# Patient Record
Sex: Male | Born: 1945
Health system: Southern US, Community
[De-identification: ages and names within clinical notes are randomized; demographics above are authoritative.]

## PROBLEM LIST (undated history)

## (undated) DIAGNOSIS — L509 Urticaria, unspecified: Secondary | ICD-10-CM

## (undated) DIAGNOSIS — I2510 Atherosclerotic heart disease of native coronary artery without angina pectoris: Secondary | ICD-10-CM

## (undated) DIAGNOSIS — L409 Psoriasis, unspecified: Secondary | ICD-10-CM

## (undated) DIAGNOSIS — E7800 Pure hypercholesterolemia, unspecified: Secondary | ICD-10-CM

## (undated) DIAGNOSIS — Z87442 Personal history of urinary calculi: Secondary | ICD-10-CM

## (undated) DIAGNOSIS — M549 Dorsalgia, unspecified: Secondary | ICD-10-CM

## (undated) DIAGNOSIS — L309 Dermatitis, unspecified: Secondary | ICD-10-CM

## (undated) DIAGNOSIS — K5790 Diverticulosis of intestine, part unspecified, without perforation or abscess without bleeding: Secondary | ICD-10-CM

## (undated) DIAGNOSIS — M1990 Unspecified osteoarthritis, unspecified site: Secondary | ICD-10-CM

## (undated) DIAGNOSIS — I739 Peripheral vascular disease, unspecified: Secondary | ICD-10-CM

## (undated) DIAGNOSIS — G629 Polyneuropathy, unspecified: Secondary | ICD-10-CM

## (undated) DIAGNOSIS — E785 Hyperlipidemia, unspecified: Secondary | ICD-10-CM

## (undated) DIAGNOSIS — K219 Gastro-esophageal reflux disease without esophagitis: Secondary | ICD-10-CM

## (undated) DIAGNOSIS — I10 Essential (primary) hypertension: Secondary | ICD-10-CM

## (undated) DIAGNOSIS — E669 Obesity, unspecified: Secondary | ICD-10-CM

## (undated) DIAGNOSIS — I779 Disorder of arteries and arterioles, unspecified: Secondary | ICD-10-CM

## (undated) HISTORY — DX: Diverticulosis of intestine, part unspecified, without perforation or abscess without bleeding: K57.90

## (undated) HISTORY — PX: BACK SURGERY: SHX140

## (undated) HISTORY — DX: Dorsalgia, unspecified: M54.9

## (undated) HISTORY — DX: Disorder of arteries and arterioles, unspecified: I77.9

## (undated) HISTORY — DX: Dermatitis, unspecified: L30.9

## (undated) HISTORY — PX: CORONARY STENT PLACEMENT: SHX1402

## (undated) HISTORY — DX: Gastro-esophageal reflux disease without esophagitis: K21.9

## (undated) HISTORY — DX: Obesity, unspecified: E66.9

## (undated) HISTORY — PX: CHOLECYSTECTOMY: SHX55

## (undated) HISTORY — PX: HERNIA REPAIR: SHX51

## (undated) HISTORY — DX: Peripheral vascular disease, unspecified: I73.9

## (undated) HISTORY — PX: KIDNEY STONE SURGERY: SHX686

## (undated) HISTORY — PX: TONSILLECTOMY: SUR1361

## (undated) HISTORY — DX: Polyneuropathy, unspecified: G62.9

## (undated) HISTORY — PX: NO PAST SURGERIES: SHX2092

## (undated) HISTORY — PX: APPENDECTOMY: SHX54

## (undated) HISTORY — DX: Psoriasis, unspecified: L40.9

## (undated) HISTORY — DX: Urticaria, unspecified: L50.9

## (undated) HISTORY — DX: Hyperlipidemia, unspecified: E78.5

---

## 1998-11-14 ENCOUNTER — Inpatient Hospital Stay (HOSPITAL_COMMUNITY): Admission: RE | Admit: 1998-11-14 | Discharge: 1998-11-16 | Payer: Self-pay | Admitting: Neurosurgery

## 1998-12-19 ENCOUNTER — Ambulatory Visit (HOSPITAL_COMMUNITY): Admission: RE | Admit: 1998-12-19 | Discharge: 1998-12-19 | Payer: Self-pay | Admitting: Neurosurgery

## 1998-12-19 ENCOUNTER — Encounter: Payer: Self-pay | Admitting: Neurosurgery

## 1999-02-23 ENCOUNTER — Ambulatory Visit (HOSPITAL_COMMUNITY): Admission: RE | Admit: 1999-02-23 | Discharge: 1999-02-23 | Payer: Self-pay | Admitting: Neurosurgery

## 1999-02-23 ENCOUNTER — Encounter: Payer: Self-pay | Admitting: Neurosurgery

## 2002-03-22 ENCOUNTER — Encounter: Admission: RE | Admit: 2002-03-22 | Discharge: 2002-03-22 | Payer: Self-pay | Admitting: Neurosurgery

## 2002-03-22 ENCOUNTER — Encounter: Payer: Self-pay | Admitting: Neurosurgery

## 2002-12-13 ENCOUNTER — Ambulatory Visit (HOSPITAL_COMMUNITY): Admission: RE | Admit: 2002-12-13 | Discharge: 2002-12-14 | Payer: Self-pay | Admitting: Cardiology

## 2002-12-13 ENCOUNTER — Encounter: Payer: Self-pay | Admitting: Cardiology

## 2003-01-14 ENCOUNTER — Encounter (HOSPITAL_COMMUNITY): Admission: RE | Admit: 2003-01-14 | Discharge: 2003-04-14 | Payer: Self-pay | Admitting: Cardiology

## 2003-04-15 ENCOUNTER — Encounter (HOSPITAL_COMMUNITY): Admission: RE | Admit: 2003-04-15 | Discharge: 2003-04-24 | Payer: Self-pay | Admitting: *Deleted

## 2004-11-16 ENCOUNTER — Encounter: Admission: RE | Admit: 2004-11-16 | Discharge: 2004-12-01 | Payer: Self-pay | Admitting: Family Medicine

## 2005-09-22 ENCOUNTER — Encounter: Payer: Self-pay | Admitting: Neurosurgery

## 2007-05-08 ENCOUNTER — Encounter: Admission: RE | Admit: 2007-05-08 | Discharge: 2007-05-08 | Payer: Self-pay | Admitting: Occupational Medicine

## 2007-10-23 HISTORY — PX: CARDIAC CATHETERIZATION: SHX172

## 2007-10-25 ENCOUNTER — Inpatient Hospital Stay (HOSPITAL_BASED_OUTPATIENT_CLINIC_OR_DEPARTMENT_OTHER): Admission: RE | Admit: 2007-10-25 | Discharge: 2007-10-25 | Payer: Self-pay | Admitting: Cardiology

## 2009-12-26 ENCOUNTER — Emergency Department (HOSPITAL_BASED_OUTPATIENT_CLINIC_OR_DEPARTMENT_OTHER): Admission: EM | Admit: 2009-12-26 | Discharge: 2009-12-27 | Payer: Self-pay | Admitting: Emergency Medicine

## 2009-12-30 ENCOUNTER — Encounter: Admission: RE | Admit: 2009-12-30 | Discharge: 2009-12-30 | Payer: Self-pay | Admitting: Family Medicine

## 2010-01-13 ENCOUNTER — Encounter (INDEPENDENT_AMBULATORY_CARE_PROVIDER_SITE_OTHER): Payer: Self-pay | Admitting: General Surgery

## 2010-01-13 ENCOUNTER — Ambulatory Visit (HOSPITAL_COMMUNITY): Admission: RE | Admit: 2010-01-13 | Discharge: 2010-01-14 | Payer: Self-pay | Admitting: General Surgery

## 2010-08-07 LAB — DIFFERENTIAL
Basophils Absolute: 0 10*3/uL (ref 0.0–0.1)
Basophils Absolute: 0 10*3/uL (ref 0.0–0.1)
Basophils Relative: 1 % (ref 0–1)
Basophils Relative: 1 % (ref 0–1)
Eosinophils Absolute: 0.2 10*3/uL (ref 0.0–0.7)
Eosinophils Absolute: 0.3 10*3/uL (ref 0.0–0.7)
Eosinophils Relative: 3 % (ref 0–5)
Eosinophils Relative: 4 % (ref 0–5)
Lymphocytes Relative: 18 % (ref 12–46)
Lymphocytes Relative: 22 % (ref 12–46)
Lymphs Abs: 1.2 10*3/uL (ref 0.7–4.0)
Lymphs Abs: 1.6 10*3/uL (ref 0.7–4.0)
Monocytes Absolute: 0.4 10*3/uL (ref 0.1–1.0)
Monocytes Absolute: 0.7 10*3/uL (ref 0.1–1.0)
Monocytes Relative: 6 % (ref 3–12)
Monocytes Relative: 9 % (ref 3–12)
Neutro Abs: 4.9 10*3/uL (ref 1.7–7.7)
Neutro Abs: 4.7 10*3/uL (ref 1.7–7.7)
Neutrophils Relative %: 72 % (ref 43–77)
Neutrophils Relative %: 65 % (ref 43–77)

## 2010-08-07 LAB — CBC
HCT: 35 % — ABNORMAL LOW (ref 39.0–52.0)
HCT: 38.7 % — ABNORMAL LOW (ref 39.0–52.0)
Hemoglobin: 12.4 g/dL — ABNORMAL LOW (ref 13.0–17.0)
Hemoglobin: 13.5 g/dL (ref 13.0–17.0)
MCH: 30.2 pg (ref 26.0–34.0)
MCH: 30.3 pg (ref 26.0–34.0)
MCHC: 35.4 g/dL (ref 30.0–36.0)
MCHC: 34.9 g/dL (ref 30.0–36.0)
MCV: 85.3 fL (ref 78.0–100.0)
MCV: 86.9 fL (ref 78.0–100.0)
Platelets: 182 10*3/uL (ref 150–400)
Platelets: 150 10*3/uL (ref 150–400)
RBC: 4.11 MIL/uL — ABNORMAL LOW (ref 4.22–5.81)
RBC: 4.46 MIL/uL (ref 4.22–5.81)
RDW: 13.5 % (ref 11.5–15.5)
RDW: 12.2 % (ref 11.5–15.5)
WBC: 6.8 10*3/uL (ref 4.0–10.5)
WBC: 7.3 10*3/uL (ref 4.0–10.5)

## 2010-08-07 LAB — COMPREHENSIVE METABOLIC PANEL
ALT: 29 U/L (ref 0–53)
ALT: 39 U/L (ref 0–53)
AST: 32 U/L (ref 0–37)
AST: 38 U/L — ABNORMAL HIGH (ref 0–37)
Albumin: 4.1 g/dL (ref 3.5–5.2)
Albumin: 4.2 g/dL (ref 3.5–5.2)
Alkaline Phosphatase: 62 U/L (ref 39–117)
Alkaline Phosphatase: 80 U/L (ref 39–117)
BUN: 8 mg/dL (ref 6–23)
BUN: 13 mg/dL (ref 6–23)
CO2: 29 mEq/L (ref 19–32)
CO2: 27 mEq/L (ref 19–32)
Calcium: 9.5 mg/dL (ref 8.4–10.5)
Calcium: 9.2 mg/dL (ref 8.4–10.5)
Chloride: 104 mEq/L (ref 96–112)
Chloride: 104 mEq/L (ref 96–112)
Creatinine, Ser: 0.89 mg/dL (ref 0.4–1.5)
Creatinine, Ser: 0.9 mg/dL (ref 0.4–1.5)
GFR calc Af Amer: >60 mL/min (ref >60)
GFR calc Af Amer: >60 mL/min (ref >60)
GFR calc non Af Amer: >60 mL/min (ref >60)
GFR calc non Af Amer: >60 mL/min (ref >60)
Glucose, Bld: 121 mg/dL — ABNORMAL HIGH (ref 70–99)
Glucose, Bld: 121 mg/dL — ABNORMAL HIGH (ref 70–99)
Potassium: 4.2 mEq/L (ref 3.5–5.1)
Potassium: 4.1 mEq/L (ref 3.5–5.1)
Sodium: 141 mEq/L (ref 135–145)
Sodium: 141 mEq/L (ref 135–145)
Total Bilirubin: 0.9 mg/dL (ref 0.3–1.2)
Total Bilirubin: 1.3 mg/dL — ABNORMAL HIGH (ref 0.3–1.2)
Total Protein: 6.7 g/dL (ref 6.0–8.3)
Total Protein: 7.2 g/dL (ref 6.0–8.3)

## 2010-08-07 LAB — SURGICAL PCR SCREEN
MRSA, PCR: NEGATIVE
Staphylococcus aureus: POSITIVE — AB

## 2010-08-07 LAB — LIPASE, BLOOD: Lipase: 434 U/L — ABNORMAL HIGH (ref 23–300)

## 2010-08-07 LAB — AMYLASE: Amylase: 65 U/L (ref 0–105)

## 2010-10-06 NOTE — Cardiovascular Report (Signed)
NAMEBETHANY, CUMMING NO.:  [id number]   MEDICAL RECORD NO.:  [id number]          PATIENT TYPE:  OIB   LOCATION:  1961                         FACILITY:  [facility]   PHYSICIAN:  Armanda Magic, M.D.     DATE OF BIRTH:  12-Jan-1946   DATE OF PROCEDURE:  10/25/2007  DATE OF DISCHARGE:  10/25/2007                            CARDIAC CATHETERIZATION   REFERRING PHYSICIAN:  Chales Salmon. Abigail Miyamoto, MD   PROCEDURE:  1. Left heart catheterization.  2. Coronary angiography.  3. Left ventriculography.   OPERATOR:  Armanda Magic, MD   INDICATIONS:  Chest pain and abnormal Cardiolite with known coronary  disease.   COMPLICATIONS:  None.   IV MEDICATIONS:  Versed 1 mg, fentanyl 25 mcg.   This is a 65 year old male who has a history of known coronary disease  status post PTCA stenting of the left circumflex in 2004.  He  also has  a history of hypertension and has also been having some exertional  shortness of breath.  He now presents for cardiac catheterization  because of an abnormal Cardiolite with possible ischemia in the inferior  wall.   The patient was brought to the outpatient cath lab in the fasting  nonsedated state.  Informed consent was obtained.  The patient was  connected to continuous heart rate and pulse oximetry monitoring and  intermittent blood pressure monitoring.  The right groin was prepped and  draped in a sterile fashion.  Xylocaine 1% was used for local  anesthesia.  Using the modified Seldinger technique, a 4-French sheath  was placed in the right femoral artery.  Under fluoroscopic guidance, a  4-French JL-4 catheter was placed in the left coronary artery.  Multiple  cine films were taken at 30-degree RAO and 40-degree LAO views.  This  catheter was then exchanged out over a guidewire for a 4-French 3DRC  catheter which was placed under fluoroscopic guidance in the right  coronary artery.  Multiple cine films were taken at 30-degree RAO and 40-  degree  LAO views.  This catheter was then exchanged over a guidewire for  4-French angled pigtail catheter which was placed under fluoroscopic  guidance in the left ventricular cavity.  Left ventriculography was  performed in the 30-degree RAO view using total of 30 mL of contrast at  15 mL per second.  Catheter was then pulled back across the aortic valve  with no significant gradient noted.  At the end of the procedure, all  catheters and sheaths were removed.  Manual compression was performed  and adequate hemostasis was obtained.  The patient was transferred back  to recovery room in stable condition.   RESULTS:  1. The left main coronary artery is widely patent and bifurcates in      left anterior descending artery and left circumflex artery.  The      left anterior descending artery is widely patent throughout its      course.  The apex giving rise to a first diagonal which is widely      patent and then a second diagonal which bifurcates in 2 daughter  vessels that is widely patent.   1. The left circumflex is widely patent in its proximal portion giving      rise to a first obtuse marginal branch.  The left circumflex then      has a 20% narrowing just proximal to the stent that is in the mid      left circumflex.  The stent is widely patent.  Just distal to the      distal edge of the stent, there is a 50% narrowing in the left      circumflex.  It then gives rise to an obtuse marginal 2 and 3      branches, both of which are widely patent.   1. The right coronary artery is widely patent throughout its course      with luminal irregularities up to 20% in the mid and distal      portions.  It then bifurcates into posterior descending artery and      posterior lateral artery both of which are widely patent.   1. Left ventriculography shows normal LV function, EF 55%, LV pressure      105/6 mmHg, aortic pressure 97/59 mmHg, LVEDP 13 mmHg.   ASSESSMENT:  1. Nonobstructive  coronary disease with 50% stenosis of the left      circumflex just distal to the stent, otherwise minimal      nonobstructive disease of the right coronary artery.  2. Normal left ventricular function.  3. Normal left ventricular end diastolic pressure.  4. Noncardiac shortness of breath, most likely secondary to obesity      plus positive stress Cardiolite study most likely secondary to      diaphragmatic attenuation.   PLAN:  Continue medical management on discharge to home after IV fluid  and bedrest.  He will follow up with my nurse practitioner in 2 weeks  for groin check.      Armanda Magic, M.D.  Electronically Signed     TT/MEDQ  D:  10/25/2007  T:  10/25/2007  Job:  [id number]   cc:   Chales Salmon. Abigail Miyamoto, M.D.

## 2010-10-09 NOTE — H&P (Signed)
NAME:  QAIS, JOWERS                      ACCOUNT NO.:  [id number]   MEDICAL RECORD NO.:  [id number]                   PATIENT TYPE:  OIB   LOCATION:  2872                                 FACILITY:  [facility]   PHYSICIAN:  Francisca December, M.D.               DATE OF BIRTH:  [birth year]   DATE OF ADMISSION:  12/13/2002  DATE OF DISCHARGE:                                HISTORY & PHYSICAL   ADMISSION DIAGNOSES:  1. Coronary artery disease - mid circumflex/proximal obtuse marginal two     lesions.  2. Normal ejection fraction with normal LV function.  3. History of hyperlipidemia - currently not on statin therapy.  4. History of significant transaminase elevation on Lipitor therapy in the     past.  5. Hypertension.  6. Obesity.  7. Sedentary lifestyle.  8. Tobacco use.  9. Strong family history of coronary artery disease.   HISTORY OF PRESENT ILLNESS:  Mr. Olivo is a very pleasant 65 year old  gentleman who has undergone cardiac catheterization at the [facility] due to recurrent episodes of chest tightness and exertional dyspnea.  This was performed on November 28, 2002 by Dr. Amil Amen and revealed the  following:  LV was of normal chamber size and normal systolic function  without wall motion abnormality. EF 57%. Trileaflet aortic valve opens  normally. Mild MR, but may have been catheter induced. There was also left  coronary calcification seen. The RCA was dominant and the left main was  normal. The LAD and its branches were widely patent. Minimal luminal  irregularities. The circumflex and its branches were moderately diseased,  but without significant obstruction down to the mid portion. There was a  small OM1. The second OM has a 50-60% stenosis in its proximal portion. The  ongoing circ and AV groove are small and without significant obstruction.  The RCA and its branches are mildly diseased. Collateral vessels were not  seen. Based on that study the  possibility of percutaneous intervention  versus medical therapy were discussed with the patient and ultimately a  decision was made to proceed with PCI.   ALLERGIES:  No known drug allergies.   MEDICATIONS:  1. Folic acid.  2. Enteric-coated aspirin 325 mg a day.  3. Doxazosin 8 mg one-half a day.  4. Nexium 40 mg.  5. Hydrochlorothiazide 25 mg.  6. Bextra 20 mg per day - on hold today.   PAST MEDICAL HISTORY:  1. Hypertension.  2. History of hyperlipidemia - treated with Lipitor in the past, but has     apparently significant transaminase elevation and this was discontinued.     Will need to obtain these results.  3. Obesity.  4. Tobacco use.   The patient denies diabetes, thyroid disorder, seizure, or stroke. He has  had lower back surgery and some radiculopathy to the right lower extremity  as a result.   FAMILY HISTORY:  Both  parents are deceased. His father died in his early 34s  of an MI. His mother had a pacemaker. He has a brother who developed heart  disease in his late 81s.   SOCIAL HISTORY:  The patient is married and he has two children and one  grandchild on the way. He is employed as a Training and development officer and  has worked for [employer] for 24 years. For 23 years he worked first shift.  Last year they put him on third shift and he has had difficulties adjusting  - not sleeping well - drowsiness between 2:30 and 3:30 in the morning, etc.  He does not get any regular exercise. He chews a pack to a pack and a half  of tobacco a day and used to smoke cigarettes having quit over 18 years ago,  but at that time smoked a pack and a half to two packs for somewhere in the  neighborhood of 10+ years. He drinks at least a gallon of sweet tea a day,  usually caffeinated, but does not use illicit drugs.   REVIEW OF SYSTEMS:  The patient denies fevers, chills, cough, or cold  symptoms. No congestion. No dizziness, change in vision, or headaches. No  stroke type  symptoms. No reflux or heartburn except after eating onions. He  denies melena, hematuria, dysuria, and denies any current prostrate  troubles. No lower extremity edema. He occasionally has right lower  extremity pain/paresthesias due to back surgery.   PHYSICAL EXAMINATION:  VITAL SIGNS:  Blood pressure 148/83, pulse 64,  respirations 16. SAO2 97% on room air.  GENERAL:  The patient is alert and oriented x3 in no acute distress lying on  the exam table. He is attended by his wife and his son who is a IT sales professional.  HEENT:  Normocephalic, atraumatic.  NECK:  Supple without bruits or masses. No JVD.  LUNGS:  Clear to auscultation without crackles or wheezes.  CARDIAC:  Regular rate and rhythm without murmur, gallop, or rub.  ABDOMEN:  Obese, soft, nontender, and nondistended. Normal active bowel  sounds x4 quadrants.  EXTREMITIES:  2+ femoral pulses bilaterally without bruits. 2+ distal pulses  bilaterally without edema.  NEUROLOGIC:  Nonfocal. Mentation intact.  SKIN:  Exposed skin without obvious lesions.   LABORATORY DATA:  Review of laboratory studies done on December 06, 2002 shows a  BUN of 10, creatinine 0.9, and calcium of 3.6. Hemoglobin 13.3 and platelets  195.   PLAN:  The patient has been seen by the [facility] research team and has agreed  to enroll in the steeple study.   The risks of cardiac catheterization and intervention have been reviewed  with the patient and his family and they agree to proceed.   They have requested diet and exercise education and I would recommend  obtaining cardiac rehabilitation consult post procedure as well as tobacco  cessation consult. I would also recommend a LipoMed analysis and possible  trial of Pravachol and/or Zetia to treat for risk fact modification.   Additionally, it would be our recommendation that the patient be rescheduled  to a first or second shift position.    Georgiann Cocker Jernejcic, P.A.                   Francisca December,  M.D.    TCJ/MEDQ  D:  12/13/2002  T:  12/13/2002  Job:  [id number]   cc:   Francisca December, M.D.  [address] [employer]  [address]  [place]  [place] [address]  Fax: [phone]   Chales Salmon. Abigail Miyamoto, M.D.  [address]  [place]  [place] [address]  Fax: [phone]    cc:   Francisca December, M.D.  [address]  [place]  [place] [address]  Fax: [phone]   Chales Salmon. Abigail Miyamoto, M.D.  [address]  [place]  [place] [address]  Fax: [phone]

## 2010-10-09 NOTE — Cardiovascular Report (Signed)
NAME:  RAISTLIN, GUM                      ACCOUNT NO.:  [id number]   MEDICAL RECORD NO.:  [id number]                   PATIENT TYPE:  OIB   LOCATION:  2872                                 FACILITY:  [facility]   PHYSICIAN:  Francisca December, M.D.               DATE OF BIRTH:  Oct 06, 1945   DATE OF PROCEDURE:  12/13/2002  DATE OF DISCHARGE:                              CARDIAC CATHETERIZATION   PROCEDURES PERFORMED:  1. Percutaneous coronary intervention/drug eluding stent implantation,     second marginal branch (left circumflex marginal branch).  2. Intravascular ultrasound.   CARDIOLOGIST:  Francisca December, M.D.   INDICATIONS:  Kenneth Poole is a 65 year old man with atypical angina.  It is primarily with exertion, but not always reproducible.  He underwent an  exercise myocardial perfusion study that was not revealing.  Cardiac  catheterization was undertaken due to his age and other risk factors.  A 6%  focal stenosis in a large second marginal branch has been identified.  He is  brought now to the catheterization laboratory to allow for percutaneous  revascularization.   PROCEDURAL NOTE:  The patient was brought to the cardiac catheterization  laboratory in the a fasting state.  The right groin was prepped and draped  in the usual sterile fashion.  Local anesthesia was obtained with the  infiltration of 1% lidocaine.  A 6 French Catheter sheath was inserted  percutaneously into the right femoral artery utilizing an anterior approach  over a guiding J wire.  A 6 French #3.5 CLF guiding catheter was advanced to  the ascending aorta where the left coronary ostium was engaged.  The patient  received 5,000 units of heparin as well as a double bolus and constant  infusion of Integrelin.  The initial ACT as 241 seconds.  A 0.014 inch Sci-  Med Luge intracoronary guide wire was passed across the lesion in the second  marginal branch without difficulty.  The proximal circumflex was  rather  tortuous.  The lesion was primarily stented using a 2.5 x 12 mm Sci-Med  TAXUS drug eluding stent.  This was carefully positioned across the lesion  and inflated to a peak pressure of 12 atmospheres for approximately 45  seconds.  The balloon was deflated and removed.   Multiple cine angiograms were performed in orthogonal views documenting the  presence of mild haziness proximal to the stent.  The stented segment  looked excellent and therefore I elected to proceed with intravascular  ultrasound due to the proximal tortuosity of the vessel and risks for  unidentified infection.  A Sci-Med Atlantis catheter was passed over the  existing guide wire and a single pullback mechanically performed obtaining  images from the distal end of the stent well into the proximal circumflex.   The patient received intracoronary nitroglycerin both before and after the  deployment of the ultrasound catheter.  The ultrasound documented the artery  to be  widely patent, but with some residual plaque in the arterial wall.  There was no evidence of dissection.  The guide wire was therefore removed  and repeat angiography performed in orthogonal views.  The guiding catheter  was then removed and the sheath was sutured into place.   The patient was transported to the recovery area in stable condition with an  intact distal pulse.  He did complain of mild anginal chest discomfort  during the balloon inflation and stent inflation.  It was persisting at the  time of the procedure completion.   ANGIOGRAPHIC DATA:  As mentioned, the lesion treated was in the proximal  portion of a large branching second marginal.  This was the dominant lateral  wall vessel.  The first marginal branch is rather small.  Following balloon  dilatation and stent implantation there was no residual stenosis  angiographically.   INTRAVASCULAR ULTRASOUND:  This demonstrated he vessel to be widely patent  and the stented segment to  be well-approximated.  There was no residual  compromise of the stented lumen.  It did measure 3.5 x 3.6 mm.  There was  moderate plaquing in the vessel wall proximal to the stent, which was likely  the cause of the vessel haziness.  No evidence of dissection of luminal  compromise seen.   FINAL IMPRESSION:  1. Atherosclerotic coronary vascular disease, single-vessel.  2. Status post successful percutaneous transluminal coronary angioplasty and     stent implantation, large second marginal branch.  3. Typical angina was reproduced with device insertion and balloon     inflation.                                                 Francisca December, M.D.    JHE/MEDQ  D:  12/13/2002  T:  12/13/2002  Job:  [id number]  Chales Salmon. Abigail Miyamoto, M.D.  [address]  [place]  [place] [address]  Fax: [phone]   Cardiac Catheterization Laboratory   cc:   Chales Salmon. Abigail Miyamoto, M.D.  [address]  [place]  [place] [address]  Fax: [phone]   Cardiac Catheterization Laboratory

## 2012-07-09 ENCOUNTER — Encounter (HOSPITAL_BASED_OUTPATIENT_CLINIC_OR_DEPARTMENT_OTHER): Payer: Self-pay | Admitting: *Deleted

## 2012-07-09 ENCOUNTER — Emergency Department (HOSPITAL_BASED_OUTPATIENT_CLINIC_OR_DEPARTMENT_OTHER)
Admission: EM | Admit: 2012-07-09 | Discharge: 2012-07-09 | Disposition: A | Discharge status: Home / Self Care | Payer: BC Managed Care – PPO | Attending: Emergency Medicine | Admitting: Emergency Medicine

## 2012-07-09 ENCOUNTER — Emergency Department (HOSPITAL_BASED_OUTPATIENT_CLINIC_OR_DEPARTMENT_OTHER): Payer: BC Managed Care – PPO

## 2012-07-09 DIAGNOSIS — M79609 Pain in unspecified limb: Secondary | ICD-10-CM | POA: Insufficient documentation

## 2012-07-09 DIAGNOSIS — I2510 Atherosclerotic heart disease of native coronary artery without angina pectoris: Secondary | ICD-10-CM | POA: Insufficient documentation

## 2012-07-09 DIAGNOSIS — M79606 Pain in leg, unspecified: Secondary | ICD-10-CM

## 2012-07-09 DIAGNOSIS — I10 Essential (primary) hypertension: Secondary | ICD-10-CM | POA: Insufficient documentation

## 2012-07-09 DIAGNOSIS — Z7982 Long term (current) use of aspirin: Secondary | ICD-10-CM | POA: Insufficient documentation

## 2012-07-09 DIAGNOSIS — E7800 Pure hypercholesterolemia, unspecified: Secondary | ICD-10-CM | POA: Insufficient documentation

## 2012-07-09 DIAGNOSIS — Z79899 Other long term (current) drug therapy: Secondary | ICD-10-CM | POA: Insufficient documentation

## 2012-07-09 HISTORY — DX: Essential (primary) hypertension: I10

## 2012-07-09 HISTORY — DX: Atherosclerotic heart disease of native coronary artery without angina pectoris: I25.10

## 2012-07-09 HISTORY — DX: Pure hypercholesterolemia, unspecified: E78.00

## 2012-07-09 LAB — BASIC METABOLIC PANEL
BUN: 12 mg/dL (ref 6–23)
CO2: 26 mEq/L (ref 19–32)
Calcium: 9.3 mg/dL (ref 8.4–10.5)
Chloride: 103 mEq/L (ref 96–112)
Creatinine, Ser: 0.7 mg/dL (ref 0.50–1.35)
GFR calc Af Amer: >90 mL/min (ref >90)
GFR calc non Af Amer: >90 mL/min (ref >90)
Glucose, Bld: 109 mg/dL — ABNORMAL HIGH (ref 70–99)
Potassium: 3.9 mEq/L (ref 3.5–5.1)
Sodium: 140 mEq/L (ref 135–145)

## 2012-07-09 MED ORDER — IBUPROFEN 600 MG PO TABS: 600.0000 mg | ORAL_TABLET | Freq: Four times a day (QID) | ORAL | Status: DC | PRN

## 2012-07-09 NOTE — ED Notes (Signed)
Pt states she has had left leg pain x 3 weeks. More localized to left calf for past 2 days. Awakened at night with same. No known injury.

## 2012-07-09 NOTE — ED Provider Notes (Signed)
History  This chart was scribed for Ethelda Chick, MD by Shari Heritage, ED Scribe. The patient was seen in room MH02/MH02. Patient's care was started at 1728.   CSN: [id number]  Arrival date & time 07/09/12  1527   First MD Initiated Contact with Patient 07/09/12 1728      Chief Complaint  Patient presents with  . Leg Pain     Patient is a 67 y.o. male presenting with leg pain. The history is provided by the patient. No language interpreter was used.  Leg Pain Location:  Leg Leg location:  L lower leg Pain details:    Radiates to:  L leg   Severity:  Moderate   Duration:  3 weeks   Timing:  Constant Chronicity:  New Associated symptoms: no fever      HPI Comments:  ZAKRY CASO is a 67 y.o. male with history of hypertension, coronary artery disease and hypercholesteremia who presents to the Emergency Department complaining of moderate, constant, left lower leg pain that radiates up the posterior thigh. Patient states that pain has been present for 3 weeks and worsened significantly 2 days ago. Pain is worse while sitting or bending at the knee. Patient also reports some mild swelling to the left calf. No chest pain, shortness of breath, fever, abdominal pain, nausea, vomiting or diarrhea. Patient denies any obvious injury or trauma. Patient denies history of PE or DVT. He denies any recent car or plane trips. He has had no surgeries to the area. Patient takes aspirin 325 mg daily, but no other anticoagulants. He does not smoke.   Past Medical History  Diagnosis Date  . Hypertension   . Coronary artery disease   . Hypercholesteremia     Past Surgical History  Procedure Laterality Date  . Coronary stent placement    . Cholecystectomy    . Appendectomy    . Tonsillectomy    . Back surgery    . Kidney stone surgery      History reviewed. No pertinent family history.  History  Substance Use Topics  . Smoking status: Never Smoker   . Smokeless tobacco: Not on  file  . Alcohol Use: No      Review of Systems  Constitutional: Negative for fever.  Respiratory: Negative for shortness of breath.   Cardiovascular: Negative for chest pain.  Gastrointestinal: Negative for nausea, vomiting, abdominal pain and diarrhea.  All other systems reviewed and are negative.    Allergies  Erythromycin base  Home Medications   Current Outpatient Rx  Name  Route  Sig  Dispense  Refill  . aspirin 325 MG tablet   Oral   Take 325 mg by mouth daily.         Marland Kitchen augmented betamethasone dipropionate (DIPROLENE-AF) 0.05 % cream   Topical   Apply topically 2 (two) times daily.         Marland Kitchen doxazosin (CARDURA) 4 MG tablet   Oral   Take 4 mg by mouth at bedtime.         Marland Kitchen ezetimibe (ZETIA) 10 MG tablet   Oral   Take 10 mg by mouth daily.         . fish oil-omega-3 fatty acids 1000 MG capsule   Oral   Take 1 g by mouth daily.         . hydrochlorothiazide (HYDRODIURIL) 25 MG tablet   Oral   Take 25 mg by mouth daily.         Marland Kitchen  lisinopril (PRINIVIL,ZESTRIL) 20 MG tablet   Oral   Take 10 mg by mouth daily.         . metoprolol tartrate (LOPRESSOR) 25 MG tablet   Oral   Take 25 mg by mouth 2 (two) times daily.         . Multiple Vitamin (MULTIVITAMIN) capsule   Oral   Take 1 capsule by mouth daily.         . niacin-simvastatin (SIMCOR) 1000-20 MG 24 hr tablet   Oral   Take 1 tablet by mouth at bedtime.         Marland Kitchen omeprazole (PRILOSEC) 20 MG capsule   Oral   Take 20 mg by mouth daily.         . Plant Stanol Ester (BENECOL PO)   Oral   Take by mouth.         . potassium chloride SA (K-DUR,KLOR-CON) 20 MEQ tablet   Oral   Take 20 mEq by mouth 2 (two) times daily.           Triage Vitals: BP 124/68  Pulse 83  Temp(Src) 98.1 F (36.7 C) (Oral)  Resp 18  Ht 5\' 9"  (1.753 m)  Wt 217 lb (98.431 kg)  BMI 32.03 kg/m2  SpO2 98%  Physical Exam  Nursing note and vitals reviewed. Constitutional: He is oriented to  person, place, and time. He appears well-developed and well-nourished.  HENT:  Head: Normocephalic and atraumatic.  Eyes: Conjunctivae and EOM are normal. Pupils are equal, round, and reactive to light.  Neck: Normal range of motion. Neck supple.  Cardiovascular: Normal rate, regular rhythm and normal heart sounds.   Pulmonary/Chest: Effort normal and breath sounds normal.  Abdominal: Soft. Bowel sounds are normal.  Musculoskeletal: Normal range of motion. He exhibits tenderness. He exhibits no edema.  Tenderness to palpation over left posterior calf and popliteal fossa. Negative Homan's sign. No palpable cords. No pedal edema. Tenderness to palpation over posterior calf and popliteal fossa.   Neurological: He is alert and oriented to person, place, and time.  Skin: Skin is warm and dry.  Psychiatric: He has a normal mood and affect.    ED Course  Procedures (including critical care time)  DIAGNOSTIC STUDIES: Oxygen Saturation is 98% on room air, normal by my interpretation.    COORDINATION OF CARE: 6:37 PM- Patient informed of current plan for treatment and evaluation and agrees with plan at this time.    Labs Reviewed  BASIC METABOLIC PANEL - Abnormal; Notable for the following:    Glucose, Bld 109 (*)    All other components within normal limits    US Venous Img Lower Unilateral Left  07/09/2012  *RADIOLOGY REPORT*  Clinical Data: Left leg pain for 3 weeks.  History of old blood clot. Deep venous thrombosis.  LEFT LOWER EXTREMITY VENOUS DUPLEX ULTRASOUND  Technique:  Gray-scale sonography with graded compression, as well as color Doppler and duplex ultrasound, were performed to evaluate the deep venous system of the lower extremity from the level of the common femoral vein through the popliteal and proximal calf veins. Spectral Doppler was utilized to evaluate flow at rest and with distal augmentation maneuvers.  Comparison:  None.  Findings: There is no evidence of DVT in the  lower extremity. Normal phasicity, augmentation, and compressibility in the saphenofemoral junction, common femoral vein, femoral vein, deep femoral vein, and popliteal vein.  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  IMPRESSION: Negative left lower extremity DVT  study.   Original Report Authenticated By: Andreas Newport, M.D.      1. Lower extremity pain       MDM  Pt presents with c/o left lower leg pain- pain in popliteal fossa and calf region.  DVT study negative.  Suspect muscular strain or other musculoskeletal cause for pain.  Discharged with strict return precautions.  Pt agreeable with plan.   I personally performed the services described in this documentation, which was scribed in my presence. The recorded information has been reviewed and is accurate.    Ethelda Chick, MD 07/12/12 [phone]

## 2012-08-16 ENCOUNTER — Other Ambulatory Visit: Payer: Self-pay | Admitting: Gastroenterology

## 2013-02-20 ENCOUNTER — Other Ambulatory Visit: Payer: Self-pay | Admitting: Neurosurgery

## 2013-02-20 DIAGNOSIS — M48061 Spinal stenosis, lumbar region without neurogenic claudication: Secondary | ICD-10-CM

## 2013-02-28 ENCOUNTER — Other Ambulatory Visit: Payer: BC Managed Care – PPO

## 2013-02-28 ENCOUNTER — Ambulatory Visit
Admission: RE | Admit: 2013-02-28 | Discharge: 2013-02-28 | Disposition: A | Discharge status: Home / Self Care | Payer: BC Managed Care – PPO | Source: Ambulatory Visit | Attending: Neurosurgery | Admitting: Neurosurgery

## 2013-02-28 DIAGNOSIS — M48061 Spinal stenosis, lumbar region without neurogenic claudication: Secondary | ICD-10-CM

## 2013-02-28 MED ORDER — GADOBENATE DIMEGLUMINE 529 MG/ML IV SOLN
20.0000 mL | Freq: Once | INTRAVENOUS | Status: AC | PRN
  Administered 2013-02-28: 20 mL via INTRAVENOUS

## 2013-03-05 ENCOUNTER — Other Ambulatory Visit (HOSPITAL_COMMUNITY): Payer: Self-pay | Admitting: Neurosurgery

## 2013-03-05 DIAGNOSIS — M48062 Spinal stenosis, lumbar region with neurogenic claudication: Secondary | ICD-10-CM

## 2013-03-07 ENCOUNTER — Ambulatory Visit (HOSPITAL_COMMUNITY)
Admission: RE | Admit: 2013-03-07 | Discharge: 2013-03-07 | Disposition: A | Discharge status: Home / Self Care | Payer: BC Managed Care – PPO | Source: Ambulatory Visit | Attending: Neurosurgery | Admitting: Neurosurgery

## 2013-03-07 DIAGNOSIS — M7989 Other specified soft tissue disorders: Secondary | ICD-10-CM | POA: Insufficient documentation

## 2013-03-07 DIAGNOSIS — M79609 Pain in unspecified limb: Secondary | ICD-10-CM

## 2013-03-07 DIAGNOSIS — R609 Edema, unspecified: Secondary | ICD-10-CM | POA: Insufficient documentation

## 2013-03-07 DIAGNOSIS — M48062 Spinal stenosis, lumbar region with neurogenic claudication: Secondary | ICD-10-CM

## 2013-03-07 NOTE — Progress Notes (Signed)
VASCULAR LAB PRELIMINARY  PRELIMINARY  PRELIMINARY  PRELIMINARY  Bilateral lower extremity venous duplex completed.    Preliminary report:  Bilateral:  No evidence of DVT or superficial thrombosis. There is no evidence of a Baker's cyst on the right. There is evidence of a horseshoe shaped fluid filled space in the left popliteal fossa consistent with a ruptured Baker's cyst.   Consuello Lassalle, RVS 03/07/2013, 11:28 AM

## 2013-03-29 ENCOUNTER — Other Ambulatory Visit: Payer: Self-pay | Admitting: Cardiology

## 2013-04-02 ENCOUNTER — Ambulatory Visit (INDEPENDENT_AMBULATORY_CARE_PROVIDER_SITE_OTHER): Payer: BC Managed Care – PPO | Admitting: *Deleted

## 2013-04-02 DIAGNOSIS — E7800 Pure hypercholesterolemia, unspecified: Secondary | ICD-10-CM

## 2013-04-02 LAB — LIPID PANEL
Cholesterol: 133 mg/dL (ref 0–200)
HDL: 45.7 mg/dL (ref >39.00)
LDL Cholesterol: 75 mg/dL (ref 0–99)
Total CHOL/HDL Ratio: 3
Triglycerides: 63 mg/dL (ref 0.0–149.0)
VLDL: 12.6 mg/dL (ref 0.0–40.0)

## 2013-04-02 LAB — ALT: ALT: 62 U/L — ABNORMAL HIGH (ref 0–53)

## 2013-04-04 ENCOUNTER — Telehealth: Payer: Self-pay

## 2013-04-04 ENCOUNTER — Other Ambulatory Visit: Payer: Self-pay | Admitting: General Surgery

## 2013-04-04 ENCOUNTER — Encounter: Payer: Self-pay | Admitting: General Surgery

## 2013-04-04 DIAGNOSIS — E785 Hyperlipidemia, unspecified: Secondary | ICD-10-CM

## 2013-04-04 DIAGNOSIS — Z79899 Other long term (current) drug therapy: Secondary | ICD-10-CM

## 2013-04-04 NOTE — Telephone Encounter (Signed)
[facility] and ok'ed a refill for pts atorvastatin 20 Mg tablet once a day.

## 2013-04-04 NOTE — Progress Notes (Signed)
Letter sent to pt to make aware. Lab ordered and on lab schedule

## 2013-04-26 ENCOUNTER — Ambulatory Visit: Payer: BC Managed Care – PPO | Admitting: Cardiology

## 2013-05-10 ENCOUNTER — Encounter: Payer: Self-pay | Admitting: General Surgery

## 2013-05-10 ENCOUNTER — Encounter: Payer: Self-pay | Admitting: Cardiology

## 2013-05-10 DIAGNOSIS — I10 Essential (primary) hypertension: Secondary | ICD-10-CM | POA: Insufficient documentation

## 2013-05-10 DIAGNOSIS — I2510 Atherosclerotic heart disease of native coronary artery without angina pectoris: Secondary | ICD-10-CM | POA: Insufficient documentation

## 2013-05-10 DIAGNOSIS — E7800 Pure hypercholesterolemia, unspecified: Secondary | ICD-10-CM | POA: Insufficient documentation

## 2013-05-11 ENCOUNTER — Ambulatory Visit (INDEPENDENT_AMBULATORY_CARE_PROVIDER_SITE_OTHER): Payer: BC Managed Care – PPO | Admitting: Cardiology

## 2013-05-11 ENCOUNTER — Encounter: Payer: Self-pay | Admitting: Cardiology

## 2013-05-11 VITALS — BP 128/78 | HR 78 | Ht 67.0 in | Wt 218.4 lb

## 2013-05-11 DIAGNOSIS — I6529 Occlusion and stenosis of unspecified carotid artery: Secondary | ICD-10-CM | POA: Insufficient documentation

## 2013-05-11 DIAGNOSIS — I779 Disorder of arteries and arterioles, unspecified: Secondary | ICD-10-CM | POA: Insufficient documentation

## 2013-05-11 DIAGNOSIS — I10 Essential (primary) hypertension: Secondary | ICD-10-CM

## 2013-05-11 DIAGNOSIS — I739 Peripheral vascular disease, unspecified: Secondary | ICD-10-CM

## 2013-05-11 DIAGNOSIS — E7800 Pure hypercholesterolemia, unspecified: Secondary | ICD-10-CM

## 2013-05-11 DIAGNOSIS — I2510 Atherosclerotic heart disease of native coronary artery without angina pectoris: Secondary | ICD-10-CM

## 2013-05-11 MED ORDER — ASPIRIN 81 MG PO TABS: 81.0000 mg | ORAL_TABLET | Freq: Every day | ORAL | Status: DC

## 2013-05-11 NOTE — Progress Notes (Signed)
[address] [place], [place]  [address] Phone: [phone] Fax:  [phone]  Date:  05/11/2013   ID:  [name], DOB Oct 19, 1945, MRN [id number]  PCP:  Mickie Hillier, MD  Cardiologist:  Armanda Magic, MD     History of Present Illness: Kenneth Poole is a 67 y.o. male with a history of ASCAD, HTN and dyslipidemia who presents today for followup.  He is doing well.  He denies any chest pain, SOB, DOE, dizziness, palpitations or syncope.  He says that he has had some problems with his left leg since February.  He had knee surgery in the summer and has also had some shots behind his leg.  He has chronic LE edema.  He has no LE claudication symptoms.   Wt Readings from Last 3 Encounters:  05/10/13 220 lb 12.8 oz (100.154 kg)  07/09/12 217 lb (98.431 kg)     Past Medical History  Diagnosis Date  . Hypertension   . Hypercholesteremia   . Obesity   . GERD (gastroesophageal reflux disease)   . Diverticulosis   . Dermatitis   . Dyslipidemia   . Urticaria   . Psoriasis   . Back pain   . Peripheral neuropathy   . Coronary artery disease     s.p PTCA of circumflex lesion in 2004, cardiologist Dr Mayford Knife, nonobstructive CAD on CATH in June 2009    Current Outpatient Prescriptions  Medication Sig Dispense Refill  . aspirin 325 MG tablet Take 325 mg by mouth daily.      Marland Kitchen atorvastatin (LIPITOR) 20 MG tablet Take 20 mg by mouth daily.      Marland Kitchen augmented betamethasone dipropionate (DIPROLENE-AF) 0.05 % cream Apply topically 2 (two) times daily.      Marland Kitchen doxazosin (CARDURA) 4 MG tablet Take 4 mg by mouth at bedtime.      Marland Kitchen EPINEPHrine (EPIPEN IJ) Inject as directed as needed.      . fish oil-omega-3 fatty acids 1000 MG capsule Take 1 g by mouth daily.      . hydrochlorothiazide (HYDRODIURIL) 25 MG tablet Take 25 mg by mouth daily.      Marland Kitchen ibuprofen (ADVIL,MOTRIN) 600 MG tablet Take 1 tablet (600 mg total) by mouth every 6 (six) hours as needed for pain.  30  tablet  0  . lisinopril (PRINIVIL,ZESTRIL) 20 MG tablet Take 10 mg by mouth daily.      . metoprolol tartrate (LOPRESSOR) 25 MG tablet Take 12.5 mg by mouth 2 (two) times daily.       . Multiple Vitamin (MULTIVITAMIN) capsule Take 1 capsule by mouth daily.      . niacin-simvastatin (SIMCOR) 1000-20 MG 24 hr tablet Take 1 tablet by mouth at bedtime.      . nitroGLYCERIN (NITROSTAT) 0.4 MG SL tablet Place 0.4 mg under the tongue every 5 (five) minutes as needed for chest pain.      Marland Kitchen omeprazole (PRILOSEC) 20 MG capsule Take 20 mg by mouth daily.      . Plant Stanol Ester (BENECOL PO) Take by mouth.      . potassium chloride SA (K-DUR,KLOR-CON) 20 MEQ tablet Take 20 mEq by mouth 2 (two) times daily.      Marland Kitchen ZETIA 10 MG tablet TAKE 1 TABLET DAILY  90 tablet  1   No current facility-administered medications for this visit.    Allergies:    Allergies  Allergen Reactions  . Ace Inhibitors   . Erythromycin  Base Hives  . Simcor [Niacin-Simvastatin Er]     Elevated CPK    Social History:  The patient  reports that he quit smoking about 23 years ago. He does not have any smokeless tobacco history on file. He reports that he does not drink alcohol or use illicit drugs.   Family History:  The patient's family history is not on file.   ROS:  Please see the history of present illness.      All other systems reviewed and negative.   PHYSICAL EXAM: VS:  Ht 5\' 7"  (1.702 m) Well nourished, well developed, in no acute distress HEENT: normal Neck: no JVD Cardiac:  normal S1, S2; RRR; no murmur Lungs:  clear to auscultation bilaterally, no wheezing, rhonchi or rales Abd: soft, nontender, no hepatomegaly Ext: no edema Skin: warm and dry Neuro:  CNs 2-12 intact, no focal abnormalities noted  EKG:     NSR with no ST changes  ASSESSMENT AND PLAN:   1. ASCAD  - continue ASA and decrease dose to 81mg  daily 2. HTN - well controlled  - continue metoprolol/lisinopril/HCTZ/cardura 3. Dyslipidemia -  LDL improved on higher dose of Lipitor  - continue atorvastatin/Zetia  - ALT mildly elevated - I suspect he has a component of fatty liver - I have stressed the importance of diet and exercise.  I have recommended that he get a stationary bike and exercise 60 minutes daily along with low fat diet 4.  Reduced pulses in LE - he was told by his orthopedist that he had vascular disease in his feet based on an xray  - I will get LE arterial dopplers to rule out PVD  Followup with me in 6 months  Signed, Armanda Magic, MD 05/11/2013 3:38 PM

## 2013-05-11 NOTE — Patient Instructions (Signed)
Medication Changes:   DECREASE:   Aspirin  To 81 mg daily  Your physician has requested that you have a lower extremity arterial exercise duplex.  During this test, exercise and ultrasound are used to evaluate arterial blood flow in the legs. Allow one hour for this exam. There are no restrictions or special instructions.  Your physician wants you to follow-up in: 6 months with Dr. Mayford Knife.  You will receive a reminder letter in the mail two months in advance. If you don't receive a letter, please call our office to schedule the follow-up appointment.

## 2013-05-14 ENCOUNTER — Encounter: Payer: Self-pay | Admitting: Cardiology

## 2013-05-14 ENCOUNTER — Ambulatory Visit (HOSPITAL_COMMUNITY): Payer: BC Managed Care – PPO | Attending: Cardiology

## 2013-05-14 DIAGNOSIS — R0989 Other specified symptoms and signs involving the circulatory and respiratory systems: Secondary | ICD-10-CM | POA: Insufficient documentation

## 2013-05-14 DIAGNOSIS — Z87891 Personal history of nicotine dependence: Secondary | ICD-10-CM | POA: Insufficient documentation

## 2013-05-14 DIAGNOSIS — E785 Hyperlipidemia, unspecified: Secondary | ICD-10-CM | POA: Insufficient documentation

## 2013-05-14 DIAGNOSIS — I10 Essential (primary) hypertension: Secondary | ICD-10-CM | POA: Insufficient documentation

## 2013-05-14 DIAGNOSIS — I739 Peripheral vascular disease, unspecified: Secondary | ICD-10-CM

## 2013-05-14 DIAGNOSIS — I2510 Atherosclerotic heart disease of native coronary artery without angina pectoris: Secondary | ICD-10-CM | POA: Insufficient documentation

## 2013-06-14 ENCOUNTER — Other Ambulatory Visit: Payer: Self-pay | Admitting: Family Medicine

## 2013-06-14 DIAGNOSIS — R29898 Other symptoms and signs involving the musculoskeletal system: Secondary | ICD-10-CM

## 2013-06-18 ENCOUNTER — Ambulatory Visit
Admission: RE | Admit: 2013-06-18 | Discharge: 2013-06-18 | Disposition: A | Discharge status: Home / Self Care | Payer: BC Managed Care – PPO | Source: Ambulatory Visit | Attending: Family Medicine | Admitting: Family Medicine

## 2013-06-18 DIAGNOSIS — R29898 Other symptoms and signs involving the musculoskeletal system: Secondary | ICD-10-CM

## 2013-07-05 ENCOUNTER — Other Ambulatory Visit: Payer: BC Managed Care – PPO

## 2013-07-06 ENCOUNTER — Other Ambulatory Visit (INDEPENDENT_AMBULATORY_CARE_PROVIDER_SITE_OTHER): Payer: BC Managed Care – PPO

## 2013-07-06 DIAGNOSIS — E785 Hyperlipidemia, unspecified: Secondary | ICD-10-CM

## 2013-07-06 DIAGNOSIS — Z79899 Other long term (current) drug therapy: Secondary | ICD-10-CM

## 2013-07-06 LAB — HEPATIC FUNCTION PANEL
ALT: 46 U/L (ref 0–53)
AST: 50 U/L — ABNORMAL HIGH (ref 0–37)
Albumin: 3.4 g/dL — ABNORMAL LOW (ref 3.5–5.2)
Alkaline Phosphatase: 67 U/L (ref 39–117)
Bilirubin, Direct: 0.2 mg/dL (ref 0.0–0.3)
Total Bilirubin: 1.2 mg/dL (ref 0.3–1.2)
Total Protein: 6.6 g/dL (ref 6.0–8.3)

## 2013-07-06 LAB — LIPID PANEL
Cholesterol: 111 mg/dL (ref 0–200)
HDL: 40.1 mg/dL (ref >39.00)
LDL Cholesterol: 57 mg/dL (ref 0–99)
Total CHOL/HDL Ratio: 3
Triglycerides: 70 mg/dL (ref 0.0–149.0)
VLDL: 14 mg/dL (ref 0.0–40.0)

## 2013-07-12 ENCOUNTER — Encounter: Payer: Self-pay | Admitting: General Surgery

## 2013-07-12 ENCOUNTER — Other Ambulatory Visit: Payer: Self-pay | Admitting: General Surgery

## 2013-07-12 DIAGNOSIS — E7800 Pure hypercholesterolemia, unspecified: Secondary | ICD-10-CM

## 2013-09-28 ENCOUNTER — Other Ambulatory Visit: Payer: Self-pay | Admitting: Cardiology

## 2013-10-17 ENCOUNTER — Telehealth: Payer: Self-pay | Admitting: Cardiology

## 2013-10-17 NOTE — Telephone Encounter (Signed)
New message    Having problems with medication zetia

## 2013-11-09 ENCOUNTER — Other Ambulatory Visit: Payer: Self-pay | Admitting: Gastroenterology

## 2013-11-14 ENCOUNTER — Encounter: Payer: Self-pay | Admitting: Cardiology

## 2013-11-14 ENCOUNTER — Encounter: Payer: Self-pay | Admitting: General Surgery

## 2013-11-14 ENCOUNTER — Ambulatory Visit (INDEPENDENT_AMBULATORY_CARE_PROVIDER_SITE_OTHER): Payer: BC Managed Care – PPO | Admitting: Cardiology

## 2013-11-14 VITALS — BP 108/62 | HR 70 | Ht 69.0 in | Wt 217.0 lb

## 2013-11-14 DIAGNOSIS — E7800 Pure hypercholesterolemia, unspecified: Secondary | ICD-10-CM

## 2013-11-14 DIAGNOSIS — I2510 Atherosclerotic heart disease of native coronary artery without angina pectoris: Secondary | ICD-10-CM

## 2013-11-14 DIAGNOSIS — I10 Essential (primary) hypertension: Secondary | ICD-10-CM

## 2013-11-14 LAB — BASIC METABOLIC PANEL
BUN: 10 mg/dL (ref 6–23)
CO2: 30 mEq/L (ref 19–32)
Calcium: 9.3 mg/dL (ref 8.4–10.5)
Chloride: 103 mEq/L (ref 96–112)
Creatinine, Ser: 0.8 mg/dL (ref 0.4–1.5)
GFR: 104.15 mL/min (ref >60.00)
Glucose, Bld: 112 mg/dL — ABNORMAL HIGH (ref 70–99)
Potassium: 4.1 mEq/L (ref 3.5–5.1)
Sodium: 137 mEq/L (ref 135–145)

## 2013-11-14 NOTE — Patient Instructions (Signed)
Your physician has recommended you make the following change in your medication: 1. Hold Atorvastatin for two weeks to see if muscle aches are better. Call us and let us know.   Your physician recommends that you go to the lab today for a BMET  Your physician wants you to follow-up in: 6 months with Dr Mallie Snooks will receive a reminder letter in the mail two months in advance. If you don't receive a letter, please call our office to schedule the follow-up appointment.

## 2013-11-14 NOTE — Progress Notes (Signed)
Kenneth Poole, [place], [place]  [address] Phone: [phone] Fax:  [phone]  Date:  11/14/2013   ID:  Kenneth Poole, DOB 1946/02/03, MRN [id number]  PCP:  Gennette Pac, MD  Cardiologist:  Fransico Him, MD     History of Present Illness: Kenneth Poole is a 68 y.o. male with a history of ASCAD, HTN and dyslipidemia who presents today for followup. He is doing well. He denies any chest pain, SOB, DOE, dizziness, palpitations or syncope.  He has chronic LE edema which is stable.  He has had some weakness in his muscles, especially in his upper body along with aching which seems to have started after his carpal tunnel surgery.   Wt Readings from Last 3 Encounters:  11/14/13 217 lb (98.431 kg)  05/11/13 218 lb 6.4 oz (99.066 kg)  05/10/13 220 lb 12.8 oz (100.154 kg)     Past Medical History  Diagnosis Date  . Hypertension   . Hypercholesteremia   . Obesity   . GERD (gastroesophageal reflux disease)   . Diverticulosis   . Dermatitis   . Dyslipidemia   . Hypercholesteremia   . Urticaria   . Psoriasis   . Back pain   . Peripheral neuropathy   . Coronary artery disease     s.p PTCA of circumflex lesion in 2004, cardiologist Dr Radford Pax, nonobstructive CAD on CATH in June 2009  . Carotid artery occlusion     20-30% stenosis of RICA and less than 20% left ICA stenosis    Current Outpatient Prescriptions  Medication Sig Dispense Refill  . atorvastatin (LIPITOR) 20 MG tablet Take 20 mg by mouth daily.      . ciprofloxacin (CIPRO) 500 MG tablet       . doxazosin (CARDURA) 4 MG tablet Take 4 mg by mouth at bedtime.      Marland Kitchen EPINEPHrine (EPIPEN IJ) Inject as directed as needed.      . fish oil-omega-3 fatty acids 1000 MG capsule Take 1 g by mouth daily. 4 tablets daily      . hydrochlorothiazide (HYDRODIURIL) 25 MG tablet Take 25 mg by mouth daily.      Marland Kitchen ketoconazole (NIZORAL) 2 % cream       . lisinopril (PRINIVIL,ZESTRIL) 20 MG tablet Take 10 mg by mouth  daily.      . metoprolol tartrate (LOPRESSOR) 25 MG tablet Take 12.5 mg by mouth 2 (two) times daily.       . metroNIDAZOLE (FLAGYL) 500 MG tablet       . Multiple Vitamin (MULTIVITAMIN) capsule Take 1 capsule by mouth daily.      . nitroGLYCERIN (NITROSTAT) 0.4 MG SL tablet Place 0.4 mg under the tongue every 5 (five) minutes as needed for chest pain.      Marland Kitchen omeprazole (PRILOSEC) 20 MG capsule Take 40 mg by mouth daily.       . Plant Stanol Ester (BENECOL PO) Take by mouth 2 (two) times daily.       . potassium chloride SA (K-DUR,KLOR-CON) 20 MEQ tablet Take 20 mEq by mouth daily.       Marland Kitchen ZETIA 10 MG tablet TAKE 1 TABLET DAILY  90 tablet  0   No current facility-administered medications for this visit.    Allergies:    Allergies  Allergen Reactions  . Ace Inhibitors   . Erythromycin Base Hives  . Simcor [Niacin-Simvastatin Er]     Elevated CPK    Social History:  The patient  reports that he quit smoking about 24 years ago. He does not have any smokeless tobacco history on file. He reports that he does not drink alcohol or use illicit drugs.   Family History:  The patient's family history is not on file.   ROS:  Please see the history of present illness.      All other systems reviewed and negative.   PHYSICAL EXAM: VS:  BP 108/62  Pulse 70  Ht 5\' 9"  (1.753 m)  Wt 217 lb (98.431 kg)  BMI 32.03 kg/m2 Well nourished, well developed, in no acute distress HEENT: normal Neck: no JVD Cardiac:  normal S1, S2; RRR; no murmur Lungs:  clear to auscultation bilaterally, no wheezing, rhonchi or rales Abd: soft, nontender, no hepatomegaly Ext: no edema Skin: warm and dry Neuro:  CNs 2-12 intact, no focal abnormalities noted      ASSESSMENT AND PLAN:  1. ASCAD - continue ASA  2. HTN - well controlled - continue metoprolol/lisinopril/HCTZ/cardura  - check BMET 3. Dyslipidemia - LDL improved on higher dose of Lipitor - continue atorvastatin/Zetia  - I have told him to hold his  atorvastatin for 2 weeks to see if his upper body muscle aching and weakness improve and he will call and let me know how he feels  Followup with me in 6 months   Signed, Fransico Him, MD 11/14/2013 8:47 AM

## 2013-11-25 ENCOUNTER — Other Ambulatory Visit: Payer: Self-pay | Admitting: Cardiology

## 2013-11-28 ENCOUNTER — Other Ambulatory Visit (HOSPITAL_BASED_OUTPATIENT_CLINIC_OR_DEPARTMENT_OTHER): Payer: Self-pay | Admitting: Cardiology

## 2013-12-26 ENCOUNTER — Other Ambulatory Visit: Payer: Self-pay | Admitting: Cardiology

## 2014-01-05 ENCOUNTER — Other Ambulatory Visit: Payer: Self-pay | Admitting: Cardiology

## 2014-01-09 ENCOUNTER — Other Ambulatory Visit (INDEPENDENT_AMBULATORY_CARE_PROVIDER_SITE_OTHER): Payer: BC Managed Care – PPO

## 2014-01-09 DIAGNOSIS — E7800 Pure hypercholesterolemia, unspecified: Secondary | ICD-10-CM

## 2014-01-09 LAB — LIPID PANEL
Cholesterol: 204 mg/dL — ABNORMAL HIGH (ref 0–200)
HDL: 32.1 mg/dL — ABNORMAL LOW (ref >39.00)
NonHDL: 171.9
Total CHOL/HDL Ratio: 6
Triglycerides: 225 mg/dL — ABNORMAL HIGH (ref 0.0–149.0)
VLDL: 45 mg/dL — ABNORMAL HIGH (ref 0.0–40.0)

## 2014-01-09 LAB — HEPATIC FUNCTION PANEL
ALT: 62 U/L — ABNORMAL HIGH (ref 0–53)
AST: 54 U/L — ABNORMAL HIGH (ref 0–37)
Albumin: 3.7 g/dL (ref 3.5–5.2)
Alkaline Phosphatase: 73 U/L (ref 39–117)
Bilirubin, Direct: 0.1 mg/dL (ref 0.0–0.3)
Total Bilirubin: 0.8 mg/dL (ref 0.2–1.2)
Total Protein: 6.9 g/dL (ref 6.0–8.3)

## 2014-01-09 LAB — LDL CHOLESTEROL, DIRECT: Direct LDL: 142.3 mg/dL

## 2014-01-14 ENCOUNTER — Other Ambulatory Visit: Payer: Self-pay | Admitting: General Surgery

## 2014-01-14 DIAGNOSIS — E7800 Pure hypercholesterolemia, unspecified: Secondary | ICD-10-CM

## 2014-01-14 MED ORDER — ATORVASTATIN CALCIUM 20 MG PO TABS: 10.0000 mg | ORAL_TABLET | Freq: Every day | ORAL | Status: DC

## 2014-02-01 ENCOUNTER — Emergency Department (HOSPITAL_COMMUNITY): Payer: BC Managed Care – PPO

## 2014-02-01 ENCOUNTER — Encounter (HOSPITAL_COMMUNITY): Payer: Self-pay | Admitting: Emergency Medicine

## 2014-02-01 ENCOUNTER — Emergency Department (HOSPITAL_COMMUNITY)
Admission: EM | Admit: 2014-02-01 | Discharge: 2014-02-01 | Disposition: A | Discharge status: Home / Self Care | Payer: BC Managed Care – PPO | Attending: Emergency Medicine | Admitting: Emergency Medicine

## 2014-02-01 DIAGNOSIS — E669 Obesity, unspecified: Secondary | ICD-10-CM | POA: Diagnosis not present

## 2014-02-01 DIAGNOSIS — L259 Unspecified contact dermatitis, unspecified cause: Secondary | ICD-10-CM | POA: Diagnosis not present

## 2014-02-01 DIAGNOSIS — T7809XA Anaphylactic reaction due to other food products, initial encounter: Secondary | ICD-10-CM | POA: Diagnosis not present

## 2014-02-01 DIAGNOSIS — Z87891 Personal history of nicotine dependence: Secondary | ICD-10-CM | POA: Insufficient documentation

## 2014-02-01 DIAGNOSIS — T628X1A Toxic effect of other specified noxious substances eaten as food, accidental (unintentional), initial encounter: Secondary | ICD-10-CM | POA: Insufficient documentation

## 2014-02-01 DIAGNOSIS — Z79899 Other long term (current) drug therapy: Secondary | ICD-10-CM | POA: Insufficient documentation

## 2014-02-01 DIAGNOSIS — Z9889 Other specified postprocedural states: Secondary | ICD-10-CM | POA: Diagnosis not present

## 2014-02-01 DIAGNOSIS — Z9861 Coronary angioplasty status: Secondary | ICD-10-CM | POA: Insufficient documentation

## 2014-02-01 DIAGNOSIS — Z792 Long term (current) use of antibiotics: Secondary | ICD-10-CM | POA: Diagnosis not present

## 2014-02-01 DIAGNOSIS — R0602 Shortness of breath: Secondary | ICD-10-CM | POA: Diagnosis not present

## 2014-02-01 DIAGNOSIS — E7800 Pure hypercholesterolemia, unspecified: Secondary | ICD-10-CM | POA: Insufficient documentation

## 2014-02-01 DIAGNOSIS — K219 Gastro-esophageal reflux disease without esophagitis: Secondary | ICD-10-CM | POA: Diagnosis not present

## 2014-02-01 DIAGNOSIS — T782XXA Anaphylactic shock, unspecified, initial encounter: Secondary | ICD-10-CM

## 2014-02-01 DIAGNOSIS — Z872 Personal history of diseases of the skin and subcutaneous tissue: Secondary | ICD-10-CM | POA: Diagnosis not present

## 2014-02-01 DIAGNOSIS — I10 Essential (primary) hypertension: Secondary | ICD-10-CM | POA: Insufficient documentation

## 2014-02-01 DIAGNOSIS — Y9289 Other specified places as the place of occurrence of the external cause: Secondary | ICD-10-CM | POA: Insufficient documentation

## 2014-02-01 DIAGNOSIS — I2510 Atherosclerotic heart disease of native coronary artery without angina pectoris: Secondary | ICD-10-CM | POA: Diagnosis not present

## 2014-02-01 DIAGNOSIS — E785 Hyperlipidemia, unspecified: Secondary | ICD-10-CM | POA: Diagnosis not present

## 2014-02-01 DIAGNOSIS — R0989 Other specified symptoms and signs involving the circulatory and respiratory systems: Secondary | ICD-10-CM | POA: Diagnosis present

## 2014-02-01 DIAGNOSIS — R0609 Other forms of dyspnea: Secondary | ICD-10-CM | POA: Insufficient documentation

## 2014-02-01 DIAGNOSIS — Y9389 Activity, other specified: Secondary | ICD-10-CM | POA: Diagnosis not present

## 2014-02-01 DIAGNOSIS — Z8669 Personal history of other diseases of the nervous system and sense organs: Secondary | ICD-10-CM | POA: Insufficient documentation

## 2014-02-01 DIAGNOSIS — L299 Pruritus, unspecified: Secondary | ICD-10-CM | POA: Diagnosis not present

## 2014-02-01 MED ORDER — PREDNISONE 20 MG PO TABS: 60.0000 mg | ORAL_TABLET | Freq: Every day | ORAL | Status: DC

## 2014-02-01 MED ORDER — DIPHENHYDRAMINE HCL 25 MG PO CAPS
25.0000 mg | ORAL_CAPSULE | Freq: Once | ORAL | Status: AC
  Administered 2014-02-01: 25 mg via ORAL
  Filled 2014-02-01: qty 1

## 2014-02-01 MED ORDER — PREDNISONE 20 MG PO TABS
60.0000 mg | ORAL_TABLET | Freq: Once | ORAL | Status: AC
  Administered 2014-02-01: 60 mg via ORAL
  Filled 2014-02-01: qty 3

## 2014-02-01 MED ORDER — FAMOTIDINE 20 MG PO TABS: 20.0000 mg | ORAL_TABLET | Freq: Two times a day (BID) | ORAL | Status: DC

## 2014-02-01 MED ORDER — GI COCKTAIL ~~LOC~~
30.0000 mL | Freq: Once | ORAL | Status: AC
  Administered 2014-02-01: 30 mL via ORAL
  Filled 2014-02-01: qty 30

## 2014-02-01 MED ORDER — FAMOTIDINE 20 MG PO TABS
20.0000 mg | ORAL_TABLET | Freq: Once | ORAL | Status: AC
  Administered 2014-02-01: 20 mg via ORAL
  Filled 2014-02-01: qty 1

## 2014-02-01 NOTE — ED Notes (Signed)
Per EMS, pt had an allergic reaction 5 minutes after taking 2 tbsp of organic vinegar. Pt states he began to feel itching, flushing, lightheadedness. EMS gave pt an epi shot, 50mg  benadryl, 50mg  zantac, 710ml fluids. Pt states his only complaint now is a dry mouth and tremors. Pt's initial BP was 80/50, BP now 126/70. Pt A&Ox4 in NAD.

## 2014-02-01 NOTE — ED Provider Notes (Signed)
CSN: [id number]     Arrival date & time 02/01/14  1926 History   First MD Initiated Contact with Patient 02/01/14 1931     Chief Complaint  Patient presents with  . Allergic Reaction     (Consider location/radiation/quality/duration/timing/severity/associated sxs/prior Treatment) HPI Comments: Patient drank organic apple cider vinegar and had anaphylactic reaction. Symptoms included Given his own Epi by EMS with resolution of symptoms.  Patient is a 69 y.o. male presenting with allergic reaction. The history is provided by the patient.  Allergic Reaction Presenting symptoms: difficulty breathing, difficulty swallowing, itching, swelling and wheezing   Difficulty breathing:    Severity:  Moderate   Onset quality:  Sudden   Duration: minutes.   Progression:  Unchanged Severity:  Mild Prior allergic episodes:  Insect allergies Context: food   Relieved by:  Epinephrine Worsened by:  Nothing tried   Past Medical History  Diagnosis Date  . Hypertension   . Hypercholesteremia   . Obesity   . GERD (gastroesophageal reflux disease)   . Diverticulosis   . Dermatitis   . Dyslipidemia   . Hypercholesteremia   . Urticaria   . Psoriasis   . Back pain   . Peripheral neuropathy   . Coronary artery disease     s.p PTCA of circumflex lesion in 2004, cardiologist Dr Radford Pax, nonobstructive CAD on CATH in June 2009  . Carotid artery occlusion     20-30% stenosis of RICA and less than 20% left ICA stenosis   Past Surgical History  Procedure Laterality Date  . Coronary stent placement    . Cholecystectomy    . Appendectomy    . Tonsillectomy    . Back surgery    . Kidney stone surgery    . Cardiac catheterization  June 2009   No family history on file. History  Substance Use Topics  . Smoking status: Former Smoker    Quit date: 05/24/1989  . Smokeless tobacco: Not on file  . Alcohol Use: No    Review of Systems  Constitutional: Negative for fever and chills.  HENT:  Positive for trouble swallowing.   Respiratory: Positive for cough, shortness of breath and wheezing.   Gastrointestinal: Negative for vomiting and abdominal pain.  Skin: Positive for itching.  All other systems reviewed and are negative.     Allergies  Ace inhibitors; Erythromycin base; and Simcor  Home Medications   Prior to Admission medications   Medication Sig Start Date End Date Taking? Authorizing Provider  atorvastatin (LIPITOR) 20 MG tablet Take 0.5 tablets (10 mg total) by mouth daily. 01/14/14   Sueanne Margarita, MD  ciprofloxacin (CIPRO) 500 MG tablet  11/09/13   Historical Provider, MD  doxazosin (CARDURA) 4 MG tablet Take 4 mg by mouth at bedtime.    Historical Provider, MD  EPINEPHrine (EPIPEN IJ) Inject as directed as needed.    Historical Provider, MD  fish oil-omega-3 fatty acids 1000 MG capsule Take 1 g by mouth daily. 4 tablets daily    Historical Provider, MD  hydrochlorothiazide (HYDRODIURIL) 25 MG tablet Take 25 mg by mouth daily.    Historical Provider, MD  ketoconazole (NIZORAL) 2 % cream  09/28/13   Historical Provider, MD  KLOR-CON M20 20 MEQ tablet TAKE 1 TABLET DAILY 01/07/14   Sueanne Margarita, MD  lisinopril (PRINIVIL,ZESTRIL) 20 MG tablet TAKE ONE-HALF (1/2) TABLET DAILY 11/28/13   Sueanne Margarita, MD  metoprolol tartrate (LOPRESSOR) 25 MG tablet TAKE ONE-HALF (1/2) TABLET TWICE A DAY  Sueanne Margarita, MD  metroNIDAZOLE (FLAGYL) 500 MG tablet  11/09/13   Historical Provider, MD  Multiple Vitamin (MULTIVITAMIN) capsule Take 1 capsule by mouth daily.    Historical Provider, MD  nitroGLYCERIN (NITROSTAT) 0.4 MG SL tablet Place 0.4 mg under the tongue every 5 (five) minutes as needed for chest pain.    Historical Provider, MD  omeprazole (PRILOSEC) 20 MG capsule Take 40 mg by mouth daily.     Historical Provider, MD  Plant Stanol Ester (BENECOL PO) Take by mouth 2 (two) times daily.     Historical Provider, MD  ZETIA 10 MG tablet TAKE 1 TABLET DAILY    Traci R Turner,  MD   BP 153/77  Pulse 95  Temp(Src) 98.4 F (36.9 C) (Oral)  Resp 20  SpO2 97% Physical Exam  Constitutional: He is oriented to person, place, and time. He appears well-developed and well-nourished. No distress.  HENT:  Head: Normocephalic and atraumatic.  Mouth/Throat: Oropharynx is clear and moist. No oropharyngeal exudate.  Eyes: EOM are normal. Pupils are equal, round, and reactive to light.  Neck: Normal range of motion. Neck supple. No tracheal deviation present.  Cardiovascular: Normal rate and regular rhythm.  Exam reveals no friction rub.   No murmur heard. Pulmonary/Chest: Effort normal and breath sounds normal. No stridor. No respiratory distress. He has no wheezes. He has no rales.  Abdominal: He exhibits no distension. There is no tenderness. There is no rebound.  Musculoskeletal: Normal range of motion. He exhibits no edema.  Neurological: He is alert and oriented to person, place, and time.  Skin: He is not diaphoretic.    ED Course  Procedures (including critical care time) Labs Review Labs Reviewed - No data to display  Imaging Review Dg Chest 2 View  02/01/2014   CLINICAL DATA:  Allergic reaction.  Lightheadedness.  EXAM: CHEST  2 VIEW  COMPARISON:  01/07/2010  FINDINGS: The heart size and mediastinal contours are within normal limits. Both lungs are clear. The visualized skeletal structures are unremarkable.  IMPRESSION: No acute cardiopulmonary findings.   Electronically Signed   By: Kalman Jewels M.D.   On: 02/01/2014 20:44     EKG Interpretation None      MDM   Final diagnoses:  Anaphylaxis, initial encounter    68 year old male presents with anaphylactic reaction. Organic apple cider vinegar than immediately had swelling in his face, shortness, stridor. EMS gave him his own Epi with swift resolution symptoms.  Patient here, looking well, feeling well. Steroids, H1 blockers, benadryl, fluids. Will observe. Feeling better, doing great after  observation. Stable for discharge.  Evelina Bucy, MD 02/02/14 [phone]

## 2014-02-01 NOTE — Discharge Instructions (Signed)

## 2014-03-01 ENCOUNTER — Encounter: Payer: Self-pay | Admitting: Cardiology

## 2014-03-15 ENCOUNTER — Other Ambulatory Visit (INDEPENDENT_AMBULATORY_CARE_PROVIDER_SITE_OTHER): Payer: BC Managed Care – PPO | Admitting: *Deleted

## 2014-03-15 DIAGNOSIS — E7800 Pure hypercholesterolemia, unspecified: Secondary | ICD-10-CM

## 2014-03-15 LAB — LIPID PANEL
Cholesterol: 191 mg/dL (ref 0–200)
HDL: 33.8 mg/dL — ABNORMAL LOW (ref >39.00)
NonHDL: 157.2
Total CHOL/HDL Ratio: 6
Triglycerides: 203 mg/dL — ABNORMAL HIGH (ref 0.0–149.0)
VLDL: 40.6 mg/dL — ABNORMAL HIGH (ref 0.0–40.0)

## 2014-03-15 LAB — HEPATIC FUNCTION PANEL
ALT: 61 U/L — ABNORMAL HIGH (ref 0–53)
AST: 50 U/L — ABNORMAL HIGH (ref 0–37)
Albumin: 3.2 g/dL — ABNORMAL LOW (ref 3.5–5.2)
Alkaline Phosphatase: 69 U/L (ref 39–117)
Bilirubin, Direct: 0 mg/dL (ref 0.0–0.3)
Total Bilirubin: 0.7 mg/dL (ref 0.2–1.2)
Total Protein: 6.7 g/dL (ref 6.0–8.3)

## 2014-03-15 LAB — LDL CHOLESTEROL, DIRECT: Direct LDL: 116.1 mg/dL

## 2014-03-17 ENCOUNTER — Other Ambulatory Visit: Payer: Self-pay | Admitting: Cardiology

## 2014-03-20 ENCOUNTER — Telehealth: Payer: Self-pay

## 2014-03-20 ENCOUNTER — Other Ambulatory Visit: Payer: Self-pay

## 2014-03-20 DIAGNOSIS — E7800 Pure hypercholesterolemia, unspecified: Secondary | ICD-10-CM

## 2014-03-20 NOTE — Telephone Encounter (Signed)
Informed patient of lab results.  Explained he is being referred to the [facility].   Routing to [facility] to make appointment.

## 2014-04-02 ENCOUNTER — Ambulatory Visit (INDEPENDENT_AMBULATORY_CARE_PROVIDER_SITE_OTHER): Payer: BC Managed Care – PPO | Admitting: Pharmacist

## 2014-04-02 DIAGNOSIS — E7800 Pure hypercholesterolemia, unspecified: Secondary | ICD-10-CM

## 2014-04-02 NOTE — Patient Instructions (Signed)
Start Livalo 1mg  daily (1/2 tablet of the 2mg )  Recheck labs in 5 weeks

## 2014-04-02 NOTE — Progress Notes (Signed)
HPI  Kenneth Poole is a 68 yo pt of Kenneth Poole referred to the lipid clinic for multiple medication intolerances.   He has a history of CAD, HTN and HLD.  He is currently taking Zetia 10mg  daily, Benecol 2gm/d, and fish oil 4gm/day.  His last labs suggested changing from Lipitor 20mg  to 10mg  daily.  He did this but then had to stop the 10mg  as well due to myalgias.  He has intolerances to lipitor 10 and 20mg  daily, Crestor and SimCor.  All of these caused muscle aches and SimCor also caused an increase in CK.  His LDL was as low as 57 on Lipitor 20mg  daily but unable to tolerate long term.  His LFTs have been elevated no matter if he has been on statin or not.  ? Fatty liver given he does carry extra weight around his abdominal area.  He states he has never had this worked up in the past.    RF: CAD, HTN, Age  LDL goal <70, non-HDL goal<100 Current Meds: Zetia 10mg  daily, Benecol and fish oil 4 gm daily Intolerances: Crestor (? Dose), Lipitor 10mg  and 20mg  daily, SimCor  Pt currently works in maintenance.  He does not exercise on a regular basis due to neuropathy in his feet.  He does have 4 grandsons between the age of 31 and 56 that he plays with often.   Pt eats a fairly healthy diet.  He has lots of fresh fruits, high fiber foods, and salads for dinner.    LABS:  02/2014: TC 191, TG 203, HDL 34, LDL 116 (Zetia 10mg , Benecol and fish oil) 12/2013: TC 204, TG 225, HDL 32, LDL 142 (Zetia 10mg , Benecol, and fish oil)   Current Outpatient Prescriptions  Medication Sig Dispense Refill  . augmented betamethasone dipropionate (DIPROLENE-AF) 0.05 % cream Apply 1 application topically 2 (two) times daily.    . calcipotriene-betamethasone (TACLONEX) ointment Apply 1 application topically 2 (two) times daily.    Marland Kitchen doxazosin (CARDURA) 4 MG tablet Take 4 mg by mouth daily.     Marland Kitchen EPINEPHrine (EPIPEN IJ) Inject as directed as needed.    . ezetimibe (ZETIA) 10 MG tablet Take 10 mg by mouth daily.    .  famotidine (PEPCID) 20 MG tablet Take 1 tablet (20 mg total) by mouth 2 (two) times daily. 10 tablet 0  . fish oil-omega-3 fatty acids 1000 MG capsule Take 1 g by mouth daily. 4 tablets daily    . hydrochlorothiazide (HYDRODIURIL) 25 MG tablet Take 25 mg by mouth daily.    Marland Kitchen lisinopril (PRINIVIL,ZESTRIL) 20 MG tablet Take 10 mg by mouth daily.    . metoprolol tartrate (LOPRESSOR) 25 MG tablet Take 12.5 mg by mouth 2 (two) times daily.    . Multiple Vitamin (MULTIVITAMIN) capsule Take 1 capsule by mouth daily.    . nitroGLYCERIN (NITROSTAT) 0.4 MG SL tablet Place 0.4 mg under the tongue every 5 (five) minutes as needed for chest pain.    Marland Kitchen omeprazole (PRILOSEC) 20 MG capsule Take 40 mg by mouth daily.     . Plant Stanol Ester (BENECOL PO) Take by mouth 2 (two) times daily.     . potassium chloride SA (K-DUR,KLOR-CON) 20 MEQ tablet Take 20 mEq by mouth daily.    . predniSONE (DELTASONE) 20 MG tablet Take 3 tablets (60 mg total) by mouth daily. 12 tablet 0  . ZETIA 10 MG tablet TAKE 1 TABLET DAILY 90 tablet 0   No current facility-administered medications  for this visit.   Allergies  Allergen Reactions  . Ace Inhibitors   . Erythromycin Base Hives  . Simcor [Niacin-Simvastatin Er]     Elevated CPK

## 2014-04-02 NOTE — Assessment & Plan Note (Signed)
Despite only being on Zetia, pt's cholesterol has continued to improve.  He is still not at goal.  Discussed roles of statins and benefit in pt's with CAD.  Pt is willing to try another statin.  Discussed Livalo versus pravastatin.  He is willing to try Livalo 1mg  daily.  If he has problems, may need to consider PCSK-9 in the future.  Pt's insurance is changing in January so that may change what options we have as well.  Pt will have labs checked in 4-6 weeks prior to yearly follow up with Dr. Radford Pax.

## 2014-05-07 ENCOUNTER — Other Ambulatory Visit (INDEPENDENT_AMBULATORY_CARE_PROVIDER_SITE_OTHER): Payer: BC Managed Care – PPO | Admitting: *Deleted

## 2014-05-07 DIAGNOSIS — E7800 Pure hypercholesterolemia, unspecified: Secondary | ICD-10-CM

## 2014-05-07 DIAGNOSIS — I10 Essential (primary) hypertension: Secondary | ICD-10-CM

## 2014-05-07 DIAGNOSIS — I2510 Atherosclerotic heart disease of native coronary artery without angina pectoris: Secondary | ICD-10-CM

## 2014-05-07 LAB — LIPID PANEL
Cholesterol: 177 mg/dL (ref 0–200)
HDL: 31 mg/dL — ABNORMAL LOW (ref >39.00)
NonHDL: 146
Total CHOL/HDL Ratio: 6
Triglycerides: 227 mg/dL — ABNORMAL HIGH (ref 0.0–149.0)
VLDL: 45.4 mg/dL — ABNORMAL HIGH (ref 0.0–40.0)

## 2014-05-07 LAB — HEPATIC FUNCTION PANEL
ALT: 62 U/L — ABNORMAL HIGH (ref 0–53)
AST: 51 U/L — ABNORMAL HIGH (ref 0–37)
Albumin: 3.8 g/dL (ref 3.5–5.2)
Alkaline Phosphatase: 75 U/L (ref 39–117)
Bilirubin, Direct: 0.1 mg/dL (ref 0.0–0.3)
Total Bilirubin: 1.1 mg/dL (ref 0.2–1.2)
Total Protein: 6.7 g/dL (ref 6.0–8.3)

## 2014-05-07 LAB — LDL CHOLESTEROL, DIRECT: Direct LDL: 114.3 mg/dL

## 2014-05-10 ENCOUNTER — Encounter: Payer: Self-pay | Admitting: Cardiology

## 2014-05-10 ENCOUNTER — Ambulatory Visit (INDEPENDENT_AMBULATORY_CARE_PROVIDER_SITE_OTHER): Payer: BC Managed Care – PPO | Admitting: Cardiology

## 2014-05-10 VITALS — BP 126/78 | HR 87 | Ht 69.0 in | Wt 214.3 lb

## 2014-05-10 DIAGNOSIS — I10 Essential (primary) hypertension: Secondary | ICD-10-CM

## 2014-05-10 DIAGNOSIS — E7800 Pure hypercholesterolemia, unspecified: Secondary | ICD-10-CM

## 2014-05-10 DIAGNOSIS — R748 Abnormal levels of other serum enzymes: Secondary | ICD-10-CM

## 2014-05-10 DIAGNOSIS — R011 Cardiac murmur, unspecified: Secondary | ICD-10-CM

## 2014-05-10 DIAGNOSIS — I2583 Coronary atherosclerosis due to lipid rich plaque: Principal | ICD-10-CM

## 2014-05-10 DIAGNOSIS — I2510 Atherosclerotic heart disease of native coronary artery without angina pectoris: Secondary | ICD-10-CM

## 2014-05-10 DIAGNOSIS — R079 Chest pain, unspecified: Secondary | ICD-10-CM

## 2014-05-10 NOTE — Progress Notes (Signed)
[place], [place], [place]  [address] Phone: [phone] Fax:  [phone]  Date:  05/10/2014   ID:  Kenneth Poole, DOB [birth year], MRN [id number]  PCP:  Gennette Pac, MD  Cardiologist:  Fransico Him, MD    History of Present Illness: Kenneth Poole is a 68 y.o. male with a history of ASCAD, HTN and dyslipidemia who presents today for followup. He is doing well. He denies any chest pain or pressure, SOB, DOE, dizziness, palpitations or syncope. He has chronic LE edema which is stable. His main complaint that he sporadically will have sudden onset of bilateral arm weakness in his arms and across his upper chest.  It is nonexertional but very uncomfortable when he gets it.    Wt Readings from Last 3 Encounters:  05/10/14 214 lb 4.8 oz (97.206 kg)  11/14/13 217 lb (98.431 kg)  05/11/13 218 lb 6.4 oz (99.066 kg)     Past Medical History  Diagnosis Date  . Hypertension   . Hypercholesteremia   . Obesity   . GERD (gastroesophageal reflux disease)   . Diverticulosis   . Dermatitis   . Dyslipidemia   . Hypercholesteremia   . Urticaria   . Psoriasis   . Back pain   . Peripheral neuropathy   . Coronary artery disease     s.p PTCA of circumflex lesion in 2004, cardiologist Dr Radford Pax, nonobstructive CAD on CATH in June 2009  . Carotid artery occlusion     20-30% stenosis of RICA and less than 20% left ICA stenosis    Current Outpatient Prescriptions  Medication Sig Dispense Refill  . augmented betamethasone dipropionate (DIPROLENE-AF) 0.05 % cream Apply 1 application topically 2 (two) times daily.    . calcipotriene-betamethasone (TACLONEX) ointment Apply 1 application topically 2 (two) times daily.    Marland Kitchen doxazosin (CARDURA) 4 MG tablet Take 4 mg by mouth daily.     Marland Kitchen EPINEPHrine (EPIPEN IJ) Inject as directed as needed (Anaphylaxis).     . famotidine (PEPCID) 20 MG tablet Take 1 tablet (20 mg total) by mouth 2 (two) times daily. 10 tablet 0  . fish  oil-omega-3 fatty acids 1000 MG capsule Take 2 g by mouth 2 (two) times daily.     . hydrochlorothiazide (HYDRODIURIL) 25 MG tablet Take 25 mg by mouth daily.    Marland Kitchen lisinopril (PRINIVIL,ZESTRIL) 20 MG tablet Take 10 mg by mouth daily.    . metoprolol tartrate (LOPRESSOR) 25 MG tablet Take 12.5 mg by mouth 2 (two) times daily.    . Multiple Vitamin (MULTIVITAMIN) capsule Take 1 capsule by mouth daily.    . nitroGLYCERIN (NITROSTAT) 0.4 MG SL tablet Place 0.4 mg under the tongue every 5 (five) minutes as needed for chest pain.    Marland Kitchen omeprazole (PRILOSEC) 40 MG capsule Take 1 capsule by mouth daily.    . Pitavastatin Calcium (LIVALO) 1 MG TABS Take 1 tablet by mouth daily.    . Plant Stanol Ester (BENECOL PO) Take 1 capsule by mouth 2 (two) times daily.     . potassium chloride SA (K-DUR,KLOR-CON) 20 MEQ tablet Take 20 mEq by mouth daily.    Marland Kitchen ZETIA 10 MG tablet TAKE 1 TABLET DAILY (Patient taking differently: TAKE 1 TABLET BY MOUTH DAILY) 90 tablet 0   No current facility-administered medications for this visit.    Allergies:    Allergies  Allergen Reactions  . Erythromycin Base Anaphylaxis and Hives    ANY MED IN THE  MYCIN FAMILY  . Other Anaphylaxis    ORGANIC VINEGAR - severe hypotension  . Ace Inhibitors   . Simcor [Niacin-Simvastatin Er]     Elevated CPK    Social History:  The patient  reports that he quit smoking about 24 years ago. He does not have any smokeless tobacco history on file. He reports that he does not drink alcohol or use illicit drugs.   Family History:  The patient's family history is not on file.   ROS:  Please see the history of present illness.      All other systems reviewed and negative.   PHYSICAL EXAM: VS:  BP 126/78 mmHg  Pulse 87  Ht 5\' 9"  (1.753 m)  Wt 214 lb 4.8 oz (97.206 kg)  BMI 31.63 kg/m2 Well nourished, well developed, in no acute distress HEENT: normal Neck: no JVD Cardiac:  normal S1, S2; RRR; n1/6 SM at RUSB to LLSB Lungs:  clear to  auscultation bilaterally, no wheezing, rhonchi or rales Abd: soft, nontender, no hepatomegaly Ext: no edema Skin: warm and dry Neuro:  CNs 2-12 intact, no focal abnormalities noted   ASSESSMENT AND PLAN:  1. ASCAD - I am concerned about his episodic profound weakness in his upper extremities and across his chest.  This may be an anginal equivalent.  I will get a Lexiscan myoview to rule out ischemia.   - continue ASA  2. HTN - well controlled - continue metoprolol/lisinopril/HCTZ/cardura  - check BMET       3.  Dyslipidemia - LDL improved on higher dose of Lipitor but still not at goal - he was recently changed to Livalo and FLP after 4-6 weeks showed an LDL 114 did not change much from when he was on  Atorvastatin.  His LFTs are still elevated. - continue Livalo/Zetia  - I will refer him back to  lipid clinic in January after he gets his new insurance for consideration of PCSK 9 drug.  He will continue on Livalo until then - I will refer him to Gi to evaluate for possible fatty liver.  He does not drink any ETOH - I will also check a CPK since he is complaining of muscular symptoms in his upper body        4.  Heart murmur - will check 2D echo  Followup with me in 6 months   Signed, Fransico Him, MD [facility] 05/10/2014 4:07 PM

## 2014-05-10 NOTE — Patient Instructions (Signed)
Your physician has requested that you have a lexiscan myoview. For further information please visit [email or web address]. Please follow instruction sheet, as given.  Your physician has requested that you have an echocardiogram. Echocardiography is a painless test that uses sound waves to create images of your heart. It provides your doctor with information about the size and shape of your heart and how well your heart's chambers and valves are working. This procedure takes approximately one hour. There are no restrictions for this procedure.  Your physician recommends that you have lab work TODAY (BMET, CPK).  You have been referred to GI.   Your physician wants you to follow-up in: 6 months with Dr. Radford Pax. You will receive a reminder letter in the mail two months in advance. If you don't receive a letter, please call our office to schedule the follow-up appointment.

## 2014-05-11 LAB — BASIC METABOLIC PANEL
BUN: 9 mg/dL (ref 6–23)
CO2: 27 mEq/L (ref 19–32)
Calcium: 9.5 mg/dL (ref 8.4–10.5)
Chloride: 103 mEq/L (ref 96–112)
Creat: 0.73 mg/dL (ref 0.50–1.35)
Glucose, Bld: 85 mg/dL (ref 70–99)
Potassium: 3.8 mEq/L (ref 3.5–5.3)
Sodium: 139 mEq/L (ref 135–145)

## 2014-05-14 LAB — CK TOTAL AND CKMB (NOT AT ARMC): Total CK: 200 U/L (ref 7–232)

## 2014-05-21 ENCOUNTER — Ambulatory Visit (HOSPITAL_COMMUNITY): Payer: BC Managed Care – PPO | Attending: Cardiovascular Disease | Admitting: Radiology

## 2014-05-21 ENCOUNTER — Ambulatory Visit (HOSPITAL_BASED_OUTPATIENT_CLINIC_OR_DEPARTMENT_OTHER): Payer: BC Managed Care – PPO | Admitting: Cardiology

## 2014-05-21 ENCOUNTER — Other Ambulatory Visit: Payer: Self-pay

## 2014-05-21 ENCOUNTER — Other Ambulatory Visit: Payer: Self-pay | Admitting: Cardiology

## 2014-05-21 DIAGNOSIS — E785 Hyperlipidemia, unspecified: Secondary | ICD-10-CM | POA: Insufficient documentation

## 2014-05-21 DIAGNOSIS — R5383 Other fatigue: Secondary | ICD-10-CM | POA: Diagnosis not present

## 2014-05-21 DIAGNOSIS — R079 Chest pain, unspecified: Secondary | ICD-10-CM | POA: Diagnosis present

## 2014-05-21 DIAGNOSIS — R0609 Other forms of dyspnea: Secondary | ICD-10-CM | POA: Insufficient documentation

## 2014-05-21 DIAGNOSIS — I2510 Atherosclerotic heart disease of native coronary artery without angina pectoris: Secondary | ICD-10-CM

## 2014-05-21 DIAGNOSIS — I10 Essential (primary) hypertension: Secondary | ICD-10-CM | POA: Diagnosis not present

## 2014-05-21 DIAGNOSIS — R011 Cardiac murmur, unspecified: Secondary | ICD-10-CM

## 2014-05-21 DIAGNOSIS — I2583 Coronary atherosclerosis due to lipid rich plaque: Secondary | ICD-10-CM

## 2014-05-21 MED ORDER — PITAVASTATIN CALCIUM 1 MG PO TABS: 1.0000 | ORAL_TABLET | Freq: Every day | ORAL | Status: DC

## 2014-05-21 MED ORDER — TECHNETIUM TC 99M SESTAMIBI GENERIC - CARDIOLITE
33.0000 | Freq: Once | INTRAVENOUS | Status: AC | PRN
  Administered 2014-05-21: 33 via INTRAVENOUS

## 2014-05-21 MED ORDER — REGADENOSON 0.4 MG/5ML IV SOLN
0.4000 mg | Freq: Once | INTRAVENOUS | Status: AC
  Administered 2014-05-21: 0.4 mg via INTRAVENOUS

## 2014-05-21 MED ORDER — TECHNETIUM TC 99M SESTAMIBI GENERIC - CARDIOLITE
11.0000 | Freq: Once | INTRAVENOUS | Status: AC | PRN
  Administered 2014-05-21: 11 via INTRAVENOUS

## 2014-05-21 NOTE — Progress Notes (Signed)
  [name] [address] [place], [place] [address] [phone]    Cardiology Nuclear Med Study  Kenneth Poole is a 68 y.o. male     MRN : [id number]     DOB: 04/04/1946  Procedure Date: 05/21/2014  Nuclear Med Background Indication for Stress Test:  Evaluation for Ischemia History:  CAD, MPI '12 ABN inferior wall ischemia Cardiac Risk Factors: Carotid Disease, Hypertension and Lipids  Symptoms:  Chest Pain, DOE and Fatigue   Nuclear Pre-Procedure Caffeine/Decaff Intake:  None NPO After: 9:00pm   Lungs:  clear O2 Sat: 95% on room air. IV 0.9% NS with Angio Cath:  22g  IV Site: R Hand  IV Started by:  Matilde Haymaker, RN  Chest Size (in):  44 Cup Size: n/a  Height: 5\' 9"  (1.753 m)  Weight:  210 lb (95.255 kg)  BMI:  Body mass index is 31 kg/(m^2). Tech Comments:  Lopressor taken at [employer] today    Nuclear Med Study 1 or 2 day study: 1 day  Stress Test Type:  Lexiscan  Reading MD: n/a  Order Authorizing Provider:  Tressia Miners Turner,MD  Resting Radionuclide: Technetium 73m Sestamibi  Resting Radionuclide Dose: 11.0 mCi   Stress Radionuclide:  Technetium 107m Sestamibi  Stress Radionuclide Dose: 33.0 mCi           Stress Protocol Rest HR: 63 Stress HR: 100  Rest BP: 109/64 Stress BP: 124/65  Exercise Time (min): n/a METS: n/a   Predicted Max HR: 154 bpm % Max HR: 64.94 bpm Rate Pressure Product: 12500   Dose of Adenosine (mg):  n/a Dose of Lexiscan: 0.4 mg  Dose of Atropine (mg): n/a Dose of Dobutamine: n/a mcg/kg/min (at max HR)  Stress Test Technologist: Perrin Maltese, EMT-P  Nuclear Technologist:  Earl Many, CNMT     Rest Procedure:  Myocardial perfusion imaging was performed at rest 45 minutes following the intravenous administration of Technetium 73m Sestamibi. Rest ECG: NSR - Normal EKG  Stress Procedure:  The patient received IV Lexiscan 0.4 mg over 15-seconds.  Technetium 37m Sestamibi injected at 30-seconds. This  patient was sob with the Lexiscan injection. Quantitative spect images were obtained after a 45 minute delay. Stress ECG: No significant change from baseline ECG  QPS Raw Data Images:  Normal; no motion artifact; normal heart/lung ratio. Stress Images:  Normal homogeneous uptake in all areas of the myocardium. Rest Images:  Normal homogeneous uptake in all areas of the myocardium. Subtraction (SDS):  No evidence of ischemia. Transient Ischemic Dilatation (Normal <1.22):  0.93 Lung/Heart Ratio (Normal <0.45):  0.36  Quantitative Gated Spect Images QGS EDV:  100 ml QGS ESV:  38 ml  Impression Exercise Capacity:  Lexiscan with low level exercise. BP Response:  Normal blood pressure response. Clinical Symptoms:  No chest pain. ECG Impression:  No significant ST segment change suggestive of ischemia. Comparison with Prior Nuclear Study: No images to compare  Overall Impression:  Normal stress nuclear study.  LV Ejection Fraction: 62%.  LV Wall Motion:  NL LV Function; NL Wall Motion  Darlin Coco MD

## 2014-05-21 NOTE — Progress Notes (Signed)
Echo performed. 

## 2014-05-22 ENCOUNTER — Other Ambulatory Visit: Payer: Self-pay | Admitting: *Deleted

## 2014-05-22 ENCOUNTER — Other Ambulatory Visit: Payer: Self-pay | Admitting: Cardiology

## 2014-05-22 MED ORDER — NITROGLYCERIN 0.4 MG SL SUBL: 0.4000 mg | SUBLINGUAL_TABLET | SUBLINGUAL | Status: DC | PRN

## 2014-05-23 ENCOUNTER — Other Ambulatory Visit: Payer: Self-pay | Admitting: *Deleted

## 2014-05-23 MED ORDER — NITROGLYCERIN 0.4 MG SL SUBL: 0.4000 mg | SUBLINGUAL_TABLET | SUBLINGUAL | Status: DC | PRN

## 2014-06-17 DIAGNOSIS — Z13220 Encounter for screening for lipoid disorders: Secondary | ICD-10-CM | POA: Diagnosis not present

## 2014-06-17 DIAGNOSIS — R740 Nonspecific elevation of levels of transaminase and lactic acid dehydrogenase [LDH]: Secondary | ICD-10-CM | POA: Diagnosis not present

## 2014-06-17 DIAGNOSIS — Z0000 Encounter for general adult medical examination without abnormal findings: Secondary | ICD-10-CM | POA: Diagnosis not present

## 2014-06-17 DIAGNOSIS — Z125 Encounter for screening for malignant neoplasm of prostate: Secondary | ICD-10-CM | POA: Diagnosis not present

## 2014-06-19 DIAGNOSIS — N402 Nodular prostate without lower urinary tract symptoms: Secondary | ICD-10-CM | POA: Diagnosis not present

## 2014-06-19 DIAGNOSIS — Z23 Encounter for immunization: Secondary | ICD-10-CM | POA: Diagnosis not present

## 2014-06-19 DIAGNOSIS — I25119 Atherosclerotic heart disease of native coronary artery with unspecified angina pectoris: Secondary | ICD-10-CM | POA: Diagnosis not present

## 2014-06-19 DIAGNOSIS — R7301 Impaired fasting glucose: Secondary | ICD-10-CM | POA: Diagnosis not present

## 2014-06-19 DIAGNOSIS — I10 Essential (primary) hypertension: Secondary | ICD-10-CM | POA: Diagnosis not present

## 2014-06-19 DIAGNOSIS — K760 Fatty (change of) liver, not elsewhere classified: Secondary | ICD-10-CM | POA: Diagnosis not present

## 2014-06-19 DIAGNOSIS — Z0000 Encounter for general adult medical examination without abnormal findings: Secondary | ICD-10-CM | POA: Diagnosis not present

## 2014-06-19 DIAGNOSIS — E785 Hyperlipidemia, unspecified: Secondary | ICD-10-CM | POA: Diagnosis not present

## 2014-06-19 DIAGNOSIS — D126 Benign neoplasm of colon, unspecified: Secondary | ICD-10-CM | POA: Diagnosis not present

## 2014-06-19 DIAGNOSIS — L570 Actinic keratosis: Secondary | ICD-10-CM | POA: Diagnosis not present

## 2014-06-19 DIAGNOSIS — K219 Gastro-esophageal reflux disease without esophagitis: Secondary | ICD-10-CM | POA: Diagnosis not present

## 2014-06-19 DIAGNOSIS — R8290 Unspecified abnormal findings in urine: Secondary | ICD-10-CM | POA: Diagnosis not present

## 2014-06-26 ENCOUNTER — Encounter: Payer: Self-pay | Admitting: Cardiology

## 2014-07-04 ENCOUNTER — Encounter: Payer: Self-pay | Admitting: Cardiology

## 2014-07-04 ENCOUNTER — Ambulatory Visit: Payer: BC Managed Care – PPO | Admitting: Internal Medicine

## 2014-07-10 ENCOUNTER — Encounter: Payer: Self-pay | Admitting: Cardiology

## 2014-07-12 ENCOUNTER — Other Ambulatory Visit: Payer: Self-pay

## 2014-07-12 MED ORDER — POTASSIUM CHLORIDE CRYS ER 20 MEQ PO TBCR: 20.0000 meq | EXTENDED_RELEASE_TABLET | Freq: Every day | ORAL | Status: DC

## 2014-07-12 MED ORDER — PITAVASTATIN CALCIUM 1 MG PO TABS: 1.0000 | ORAL_TABLET | Freq: Every day | ORAL | Status: DC

## 2014-07-12 MED ORDER — METOPROLOL TARTRATE 25 MG PO TABS
12.5000 mg | ORAL_TABLET | Freq: Two times a day (BID) | ORAL | Status: DC
Start: 2014-07-12 — End: 2015-04-01

## 2014-07-12 MED ORDER — LISINOPRIL 20 MG PO TABS: 10.0000 mg | ORAL_TABLET | Freq: Every day | ORAL | Status: DC

## 2014-07-14 ENCOUNTER — Other Ambulatory Visit: Payer: Self-pay | Admitting: Cardiology

## 2014-07-15 ENCOUNTER — Other Ambulatory Visit: Payer: Self-pay

## 2014-07-15 MED ORDER — EZETIMIBE 10 MG PO TABS: 10.0000 mg | ORAL_TABLET | Freq: Every day | ORAL | Status: DC

## 2014-08-06 DIAGNOSIS — K219 Gastro-esophageal reflux disease without esophagitis: Secondary | ICD-10-CM | POA: Diagnosis not present

## 2014-08-06 DIAGNOSIS — R7301 Impaired fasting glucose: Secondary | ICD-10-CM | POA: Diagnosis not present

## 2014-08-06 DIAGNOSIS — D126 Benign neoplasm of colon, unspecified: Secondary | ICD-10-CM | POA: Diagnosis not present

## 2014-08-06 DIAGNOSIS — K760 Fatty (change of) liver, not elsewhere classified: Secondary | ICD-10-CM | POA: Diagnosis not present

## 2014-08-06 DIAGNOSIS — R8290 Unspecified abnormal findings in urine: Secondary | ICD-10-CM | POA: Diagnosis not present

## 2014-08-06 DIAGNOSIS — Z23 Encounter for immunization: Secondary | ICD-10-CM | POA: Diagnosis not present

## 2014-08-06 DIAGNOSIS — E785 Hyperlipidemia, unspecified: Secondary | ICD-10-CM | POA: Diagnosis not present

## 2014-08-06 DIAGNOSIS — Z0000 Encounter for general adult medical examination without abnormal findings: Secondary | ICD-10-CM | POA: Diagnosis not present

## 2014-08-06 DIAGNOSIS — I10 Essential (primary) hypertension: Secondary | ICD-10-CM | POA: Diagnosis not present

## 2014-08-06 DIAGNOSIS — I25119 Atherosclerotic heart disease of native coronary artery with unspecified angina pectoris: Secondary | ICD-10-CM | POA: Diagnosis not present

## 2014-08-09 ENCOUNTER — Encounter: Payer: Self-pay | Admitting: Cardiology

## 2014-08-14 DIAGNOSIS — K760 Fatty (change of) liver, not elsewhere classified: Secondary | ICD-10-CM | POA: Diagnosis not present

## 2014-08-14 DIAGNOSIS — R740 Nonspecific elevation of levels of transaminase and lactic acid dehydrogenase [LDH]: Secondary | ICD-10-CM | POA: Diagnosis not present

## 2014-09-05 DIAGNOSIS — L409 Psoriasis, unspecified: Secondary | ICD-10-CM | POA: Diagnosis not present

## 2014-09-09 DIAGNOSIS — N200 Calculus of kidney: Secondary | ICD-10-CM | POA: Diagnosis not present

## 2014-09-09 DIAGNOSIS — N402 Nodular prostate without lower urinary tract symptoms: Secondary | ICD-10-CM | POA: Diagnosis not present

## 2014-09-16 DIAGNOSIS — L400 Psoriasis vulgaris: Secondary | ICD-10-CM | POA: Diagnosis not present

## 2014-09-16 DIAGNOSIS — L570 Actinic keratosis: Secondary | ICD-10-CM | POA: Diagnosis not present

## 2014-10-08 DIAGNOSIS — J069 Acute upper respiratory infection, unspecified: Secondary | ICD-10-CM | POA: Diagnosis not present

## 2014-10-11 DIAGNOSIS — J069 Acute upper respiratory infection, unspecified: Secondary | ICD-10-CM | POA: Diagnosis not present

## 2014-10-11 DIAGNOSIS — H10233 Serous conjunctivitis, except viral, bilateral: Secondary | ICD-10-CM | POA: Diagnosis not present

## 2014-10-18 ENCOUNTER — Other Ambulatory Visit: Payer: Self-pay | Admitting: Cardiology

## 2014-10-22 NOTE — Telephone Encounter (Signed)
Per note 12.18.15

## 2014-11-14 ENCOUNTER — Encounter: Payer: Self-pay | Admitting: *Deleted

## 2014-11-18 ENCOUNTER — Encounter: Payer: Self-pay | Admitting: Cardiology

## 2014-11-18 ENCOUNTER — Ambulatory Visit (INDEPENDENT_AMBULATORY_CARE_PROVIDER_SITE_OTHER): Payer: Medicare Other | Admitting: Cardiology

## 2014-11-18 ENCOUNTER — Other Ambulatory Visit: Payer: Self-pay | Admitting: Cardiology

## 2014-11-18 VITALS — BP 124/68 | HR 73 | Ht 69.0 in | Wt 212.2 lb

## 2014-11-18 DIAGNOSIS — I6529 Occlusion and stenosis of unspecified carotid artery: Secondary | ICD-10-CM

## 2014-11-18 DIAGNOSIS — I10 Essential (primary) hypertension: Secondary | ICD-10-CM

## 2014-11-18 DIAGNOSIS — E7800 Pure hypercholesterolemia, unspecified: Secondary | ICD-10-CM

## 2014-11-18 DIAGNOSIS — I2510 Atherosclerotic heart disease of native coronary artery without angina pectoris: Secondary | ICD-10-CM

## 2014-11-18 DIAGNOSIS — I2583 Coronary atherosclerosis due to lipid rich plaque: Principal | ICD-10-CM

## 2014-11-18 NOTE — Patient Instructions (Addendum)
Medication Instructions:  Your physician recommends that you continue on your current medications as directed. Please refer to the Current Medication list given to you today.   Labwork: TODAY: BMET  Testing/Procedures: Your physician has requested that you have a carotid duplex. This test is an ultrasound of the carotid arteries in your neck. It looks at blood flow through these arteries that supply the brain with blood. Allow one hour for this exam. There are no restrictions or special instructions.  Follow-Up:  You have been referred to LIPID CLINIC. Please schedule SAME DAY as carotid duplex.  Your physician wants you to follow-up in: 1 year with Dr. Radford Pax. You will receive a reminder letter in the mail two months in advance. If you don't receive a letter, please call our office to schedule the follow-up appointment.   Any Other Special Instructions Will Be Listed Below (If Applicable).

## 2014-11-18 NOTE — Progress Notes (Signed)
Cardiology Office Note   Date:  11/18/2014   ID:  Kenneth Poole, DOB [birth year], MRN [id number]  PCP:  Gennette Pac, MD    Chief Complaint  Patient presents with  . Follow-up    CAD      History of Present Illness: Kenneth Poole is a 69 y.o. male with a history of ASCAD, HTN and dyslipidemia who presents today for followup. He is doing well. He denies any chest pain or pressure, SOB, DOE, dizziness, palpitations or syncope. He has chronic LE edema which is stable.He was having weakness in his extremities that resolved after stopping Livalo.     Past Medical History  Diagnosis Date  . Hypertension   . Hypercholesteremia   . Obesity   . GERD (gastroesophageal reflux disease)   . Diverticulosis   . Dermatitis   . Dyslipidemia   . Hypercholesteremia   . Urticaria   . Psoriasis   . Back pain   . Peripheral neuropathy   . Coronary artery disease     s.p PTCA of circumflex lesion in 2004, cardiologist Dr Radford Pax, nonobstructive CAD on CATH in June 2009 with 50% LAD  . Carotid artery occlusion     20-30% stenosis of RICA and less than 20% left ICA stenosis    Past Surgical History  Procedure Laterality Date  . Coronary stent placement    . Cholecystectomy    . Appendectomy    . Tonsillectomy    . Back surgery    . Kidney stone surgery    . Cardiac catheterization  June 2009     Current Outpatient Prescriptions  Medication Sig Dispense Refill  . augmented betamethasone dipropionate (DIPROLENE-AF) 0.05 % cream Apply 1 application topically 2 (two) times daily.    . calcipotriene-betamethasone (TACLONEX) ointment Apply 1 application topically 2 (two) times daily.    Marland Kitchen doxazosin (CARDURA) 4 MG tablet Take 4 mg by mouth daily.     Marland Kitchen EPINEPHrine (EPIPEN IJ) Inject as directed as needed (Anaphylaxis).     . famotidine (PEPCID) 20 MG tablet Take 1 tablet (20 mg total) by mouth 2 (two) times daily. 10 tablet 0  . fish oil-omega-3 fatty  acids 1000 MG capsule Take 2 g by mouth 2 (two) times daily.     . hydrochlorothiazide (HYDRODIURIL) 25 MG tablet Take 25 mg by mouth daily.    Marland Kitchen lisinopril (PRINIVIL,ZESTRIL) 20 MG tablet Take 0.5 tablets (10 mg total) by mouth daily. 30 tablet 3  . metoprolol tartrate (LOPRESSOR) 25 MG tablet Take 0.5 tablets (12.5 mg total) by mouth 2 (two) times daily. 30 tablet 5  . Multiple Vitamin (MULTIVITAMIN) capsule Take 1 capsule by mouth daily.    . nitroGLYCERIN (NITROSTAT) 0.4 MG SL tablet Place 1 tablet (0.4 mg total) under the tongue every 5 (five) minutes as needed for chest pain. 25 tablet 3  . omeprazole (PRILOSEC) 40 MG capsule Take 1 capsule by mouth daily. Pt is only taking a 20 mg tablet, because insurance will not pay for a 40 mg tablet.    . Plant Stanol Ester (BENECOL PO) Take 1 capsule by mouth 2 (two) times daily.     . potassium chloride SA (K-DUR,KLOR-CON) 20 MEQ tablet Take 1 tablet (20 mEq total) by mouth daily. 30 tablet 6   No current facility-administered medications for this visit.    Allergies:   Erythromycin base; Other; Ace inhibitors;  and Simcor    Social History:  The patient  reports that he quit smoking about 25 years ago. He does not have any smokeless tobacco history on file. He reports that he does not drink alcohol or use illicit drugs.   Family History:  The patient's family history includes Alzheimer's disease in his mother; Heart attack in his father.    ROS:  Please see the history of present illness.   Otherwise, review of systems are positive for none.   All other systems are reviewed and negative.    PHYSICAL EXAM: VS:  BP 124/68 mmHg  Pulse 73  Ht 5\' 9"  (1.753 m)  Wt 212 lb 3.2 oz (96.253 kg)  BMI 31.32 kg/m2 , BMI Body mass index is 31.32 kg/(m^2). GEN: Well nourished, well developed, in no acute distress HEENT: normal Neck: no JVD, carotid bruits, or masses Cardiac: RRR; no murmurs, rubs, or gallops,no edema  Respiratory:  clear to  auscultation bilaterally, normal work of breathing GI: soft, nontender, nondistended, + BS MS: no deformity or atrophy Skin: warm and dry, no rash Neuro:  Strength and sensation are intact Psych: euthymic mood, full affect   EKG:  EKG was ordered today and showed NSR with no ST changes    Recent Labs: 05/07/2014: ALT 62* 05/10/2014: BUN 9; Creat 0.73; Potassium 3.8; Sodium 139    Lipid Panel    Component Value Date/Time   CHOL 177 05/07/2014 0742   TRIG 227.0* 05/07/2014 0742   HDL 31.00* 05/07/2014 0742   CHOLHDL 6 05/07/2014 0742   VLDL 45.4* 05/07/2014 0742   LDLCALC 57 07/06/2013 0745   LDLDIRECT 114.3 05/07/2014 0742      Wt Readings from Last 3 Encounters:  11/18/14 212 lb 3.2 oz (96.253 kg)  05/21/14 210 lb (95.255 kg)  05/10/14 214 lb 4.8 oz (97.206 kg)     ASSESSMENT AND PLAN:  1. ASCAD - Leane Call showed no ischemia 04/2014 - continue ASA  2. HTN - well controlled - continue metoprolol/lisinopril/HCTZ/cardura  - check BMET  3. Dyslipidemia  - continue Livalo/Zetia  - Repeat LDL was 116 so I will refer him to lipid clinic for possible PCSK 9 drug - I have encouraged him to get into an routine walking program 30-45 minutes daily 4. Heart murmur -  2D echo showed moderate LVH with mild MR/TR/PR       5.  Carotid artery stenosis - recheck dopplers    Current medicines are reviewed at length with the patient today.  The patient does not have concerns regarding medicines.  The following changes have been made:  no change  Labs/ tests ordered today: See above Assessment and Plan No orders of the defined types were placed in this encounter.     Disposition:   FU with me in 1 year  Signed, Sueanne Margarita, MD  11/18/2014 3:52 PM    [facility] [place], [place], [place]  [address] Phone: [phone]; Fax: [phone]

## 2014-11-19 LAB — BASIC METABOLIC PANEL
BUN: 12 mg/dL (ref 6–23)
CO2: 27 mEq/L (ref 19–32)
Calcium: 9.4 mg/dL (ref 8.4–10.5)
Chloride: 101 mEq/L (ref 96–112)
Creatinine, Ser: 0.85 mg/dL (ref 0.40–1.50)
GFR: 95.42 mL/min (ref >60.00)
Glucose, Bld: 90 mg/dL (ref 70–99)
Potassium: 3.6 mEq/L (ref 3.5–5.1)
Sodium: 136 mEq/L (ref 135–145)

## 2014-11-20 ENCOUNTER — Ambulatory Visit (HOSPITAL_COMMUNITY): Payer: Medicare Other | Attending: Cardiology

## 2014-11-20 ENCOUNTER — Ambulatory Visit (INDEPENDENT_AMBULATORY_CARE_PROVIDER_SITE_OTHER): Payer: Medicare Other | Admitting: Pharmacist

## 2014-11-20 ENCOUNTER — Telehealth: Payer: Self-pay

## 2014-11-20 DIAGNOSIS — I6523 Occlusion and stenosis of bilateral carotid arteries: Secondary | ICD-10-CM | POA: Insufficient documentation

## 2014-11-20 DIAGNOSIS — I6529 Occlusion and stenosis of unspecified carotid artery: Secondary | ICD-10-CM

## 2014-11-20 DIAGNOSIS — E785 Hyperlipidemia, unspecified: Secondary | ICD-10-CM | POA: Diagnosis not present

## 2014-11-20 DIAGNOSIS — I10 Essential (primary) hypertension: Secondary | ICD-10-CM | POA: Diagnosis not present

## 2014-11-20 DIAGNOSIS — I2510 Atherosclerotic heart disease of native coronary artery without angina pectoris: Secondary | ICD-10-CM | POA: Diagnosis not present

## 2014-11-20 DIAGNOSIS — E7800 Pure hypercholesterolemia, unspecified: Secondary | ICD-10-CM

## 2014-11-20 NOTE — Progress Notes (Signed)
HPI  [name] is a 69 yo pt of Dr. Radford Pax referred to the lipid clinic for multiple medication intolerances and evaluation for PCSK-9.   He has a history of CAD, HTN and HLD.  He is currently taking Livalo 1mg  daily, Zetia 10mg  daily, Benecol 2gm/d, and fish oil 4gm/day.  At last visit we added Livalo 1mg  daily.  He states she has noticed some weakness in his arms with the Livalo.  He has intolerances to lipitor 10 and 20mg  daily, Crestor and SimCor.  All of these caused muscle aches and SimCor also caused an increase in CK.  His LDL was as low as 57 on Lipitor 20mg  daily but unable to tolerate long term.  His LFTs have been elevated no matter if he has been on statin or not.  ? Fatty liver given he does carry extra weight around his abdominal area.  Unsure if this has been evaluated or not.   RF: CAD, HTN, Age  LDL goal <70, non-HDL goal<100 Current Meds: Livalo 1mg , Zetia 10mg  daily, Benecol and fish oil 4 gm daily Intolerances: Crestor (? Dose), Lipitor 10mg  and 20mg  daily, SimCor  Pt currently works in maintenance.  He does not exercise on a regular basis due to neuropathy in his feet.  He does have 4 grandsons between the age of 93 and 57 that he plays with often.   Pt eats a fairly healthy diet.  He has lots of fresh fruits, high fiber foods, and salads for dinner.    LABS:  04/2014: TC 177, TG 227, HDL 31, LDL 114 (Livalo 1mg , Zetia 10mg , benecol and fish oil) 02/2014: TC 191, TG 203, HDL 34, LDL 116 (Zetia 10mg , Benecol and fish oil) 12/2013: TC 204, TG 225, HDL 32, LDL 142 (Zetia 10mg , Benecol, and fish oil)   Current Outpatient Prescriptions  Medication Sig Dispense Refill  . augmented betamethasone dipropionate (DIPROLENE-AF) 0.05 % cream Apply 1 application topically 2 (two) times daily.    . calcipotriene-betamethasone (TACLONEX) ointment Apply 1 application topically 2 (two) times daily.    Marland Kitchen doxazosin (CARDURA) 4 MG tablet Take 4 mg by mouth daily.     Marland Kitchen EPINEPHrine (EPIPEN  IJ) Inject as directed as needed (Anaphylaxis).     . famotidine (PEPCID) 20 MG tablet Take 1 tablet (20 mg total) by mouth 2 (two) times daily. 10 tablet 0  . fish oil-omega-3 fatty acids 1000 MG capsule Take 2 g by mouth 2 (two) times daily.     . hydrochlorothiazide (HYDRODIURIL) 25 MG tablet Take 25 mg by mouth daily.    Marland Kitchen lisinopril (PRINIVIL,ZESTRIL) 20 MG tablet Take 0.5 tablets (10 mg total) by mouth daily. 30 tablet 3  . metoprolol tartrate (LOPRESSOR) 25 MG tablet Take 0.5 tablets (12.5 mg total) by mouth 2 (two) times daily. 30 tablet 5  . Multiple Vitamin (MULTIVITAMIN) capsule Take 1 capsule by mouth daily.    . nitroGLYCERIN (NITROSTAT) 0.4 MG SL tablet Place 1 tablet (0.4 mg total) under the tongue every 5 (five) minutes as needed for chest pain. 25 tablet 3  . omeprazole (PRILOSEC) 40 MG capsule Take 1 capsule by mouth daily. Pt is only taking a 20 mg tablet, because insurance will not pay for a 40 mg tablet.    . Plant Stanol Ester (BENECOL PO) Take 1 capsule by mouth 2 (two) times daily.     . potassium chloride SA (K-DUR,KLOR-CON) 20 MEQ tablet Take 1 tablet (20 mEq total) by mouth daily. 30 tablet  6   No current facility-administered medications for this visit.   Allergies  Allergen Reactions  . Erythromycin Base Anaphylaxis and Hives    ANY MED IN THE MYCIN FAMILY  . Other Anaphylaxis    ORGANIC VINEGAR - severe hypotension  . Ace Inhibitors   . Simcor [Niacin-Simvastatin Er]     Elevated CPK   Assessment and Plan 1. Hyperlipidemia- Pt's LDL changed very little with the addition of Livalo.  Given he is having some issues with arm pain, will have him hold for a few weeks and see if this improves.  We discussed adding PCSK-9 and the financial implications given he has Medicare Part D.  He would like to discuss with his wife before he makes a decision.  If he does want to proceed with PCSK-9, will need updated labs.

## 2014-11-20 NOTE — Telephone Encounter (Signed)
Per note 6.29.16

## 2014-11-20 NOTE — Patient Instructions (Signed)
Stop the Livalo for a few days to see if the arm weakness gets any better.  Call Gay Filler at [phone] to let me know how you are doing.   If you decide you want to pursue the new medication (Praluent),  We will need to recheck your labs and get you to sign some paperwork.

## 2014-11-20 NOTE — Telephone Encounter (Signed)
-----   Message from Sueanne Margarita, MD sent at 11/20/2014  2:32 PM EDT ----- 40-59% right and 1-39% left ICA stenosis - repeat study in 1 year

## 2014-11-20 NOTE — Telephone Encounter (Signed)
Informed patient of results and verbal understanding expressed.   Confirmed with patient he is taking 81 mg ASA daily. Repeat duplex ordered for scheduling in one year. Patient agrees with treatment plan.

## 2014-12-16 ENCOUNTER — Telehealth: Payer: Self-pay | Admitting: Pharmacist

## 2014-12-16 NOTE — Telephone Encounter (Signed)
Pt called to report his arm pain has improved since stopping Livalo.  He wants to know what other options he has.  He states the PCSK-9 inhibitors are too expensive.  Options may include fenofibrate or remaining just on Zetia.  Pt has not had labs checked since December.  Will come in and recheck labs this week and make decision at that time.

## 2014-12-17 ENCOUNTER — Other Ambulatory Visit: Payer: Self-pay | Admitting: Cardiology

## 2014-12-18 ENCOUNTER — Other Ambulatory Visit: Payer: Self-pay

## 2014-12-18 ENCOUNTER — Other Ambulatory Visit (INDEPENDENT_AMBULATORY_CARE_PROVIDER_SITE_OTHER): Payer: Medicare Other | Admitting: *Deleted

## 2014-12-18 DIAGNOSIS — I10 Essential (primary) hypertension: Secondary | ICD-10-CM | POA: Diagnosis not present

## 2014-12-18 DIAGNOSIS — I2510 Atherosclerotic heart disease of native coronary artery without angina pectoris: Secondary | ICD-10-CM

## 2014-12-18 DIAGNOSIS — I2583 Coronary atherosclerosis due to lipid rich plaque: Secondary | ICD-10-CM

## 2014-12-18 LAB — LIPID PANEL
Cholesterol: 203 mg/dL — ABNORMAL HIGH (ref 0–200)
HDL: 35.6 mg/dL — ABNORMAL LOW (ref >39.00)
NonHDL: 167.4
Total CHOL/HDL Ratio: 6
Triglycerides: 201 mg/dL — ABNORMAL HIGH (ref 0.0–149.0)
VLDL: 40.2 mg/dL — ABNORMAL HIGH (ref 0.0–40.0)

## 2014-12-18 LAB — HEPATIC FUNCTION PANEL
ALT: 69 U/L — ABNORMAL HIGH (ref 0–53)
AST: 55 U/L — ABNORMAL HIGH (ref 0–37)
Albumin: 4.2 g/dL (ref 3.5–5.2)
Alkaline Phosphatase: 78 U/L (ref 39–117)
Bilirubin, Direct: 0.2 mg/dL (ref 0.0–0.3)
Total Bilirubin: 1 mg/dL (ref 0.2–1.2)
Total Protein: 7 g/dL (ref 6.0–8.3)

## 2014-12-18 LAB — LDL CHOLESTEROL, DIRECT: Direct LDL: 137 mg/dL

## 2014-12-18 MED ORDER — EZETIMIBE 10 MG PO TABS: ORAL_TABLET | ORAL | Status: DC

## 2014-12-18 NOTE — Addendum Note (Signed)
Addended by: Eulis Foster on: 12/18/2014 07:43 AM   Modules accepted: Orders

## 2014-12-19 MED ORDER — FENOFIBRATE 160 MG PO TABS: 160.0000 mg | ORAL_TABLET | Freq: Every day | ORAL | Status: DC

## 2014-12-19 NOTE — Telephone Encounter (Signed)
Reviewed pt's lab results.  LDL did go up ~30 pts.  Discussed options.  He would like to try fenofibrate.  Will send Rx in for patient.

## 2014-12-19 NOTE — Addendum Note (Signed)
Addended by: Elberta Leatherwood R on: 12/19/2014 03:52 PM   Modules accepted: Orders

## 2015-01-13 ENCOUNTER — Other Ambulatory Visit: Payer: Self-pay

## 2015-01-13 DIAGNOSIS — E7800 Pure hypercholesterolemia, unspecified: Secondary | ICD-10-CM

## 2015-01-16 ENCOUNTER — Telehealth: Payer: Self-pay | Admitting: *Deleted

## 2015-01-16 NOTE — Telephone Encounter (Signed)
I receive a  e-mail from the patient requesting records.

## 2015-02-06 DIAGNOSIS — H17822 Peripheral opacity of cornea, left eye: Secondary | ICD-10-CM | POA: Diagnosis not present

## 2015-02-06 DIAGNOSIS — H25813 Combined forms of age-related cataract, bilateral: Secondary | ICD-10-CM | POA: Diagnosis not present

## 2015-02-06 DIAGNOSIS — H04123 Dry eye syndrome of bilateral lacrimal glands: Secondary | ICD-10-CM | POA: Diagnosis not present

## 2015-02-06 DIAGNOSIS — H02831 Dermatochalasis of right upper eyelid: Secondary | ICD-10-CM | POA: Diagnosis not present

## 2015-02-06 DIAGNOSIS — H02834 Dermatochalasis of left upper eyelid: Secondary | ICD-10-CM | POA: Diagnosis not present

## 2015-02-10 DIAGNOSIS — Z23 Encounter for immunization: Secondary | ICD-10-CM | POA: Diagnosis not present

## 2015-02-11 DIAGNOSIS — R740 Nonspecific elevation of levels of transaminase and lactic acid dehydrogenase [LDH]: Secondary | ICD-10-CM | POA: Diagnosis not present

## 2015-02-15 ENCOUNTER — Other Ambulatory Visit: Payer: Self-pay | Admitting: Cardiology

## 2015-03-19 DIAGNOSIS — N402 Nodular prostate without lower urinary tract symptoms: Secondary | ICD-10-CM | POA: Diagnosis not present

## 2015-03-28 DIAGNOSIS — N402 Nodular prostate without lower urinary tract symptoms: Secondary | ICD-10-CM | POA: Diagnosis not present

## 2015-04-01 ENCOUNTER — Other Ambulatory Visit: Payer: Self-pay | Admitting: Cardiology

## 2015-04-09 ENCOUNTER — Other Ambulatory Visit (INDEPENDENT_AMBULATORY_CARE_PROVIDER_SITE_OTHER): Payer: Medicare Other | Admitting: *Deleted

## 2015-04-09 DIAGNOSIS — I2510 Atherosclerotic heart disease of native coronary artery without angina pectoris: Secondary | ICD-10-CM

## 2015-04-09 DIAGNOSIS — I10 Essential (primary) hypertension: Secondary | ICD-10-CM

## 2015-04-09 DIAGNOSIS — I2583 Coronary atherosclerosis due to lipid rich plaque: Secondary | ICD-10-CM | POA: Diagnosis not present

## 2015-04-09 DIAGNOSIS — E7800 Pure hypercholesterolemia, unspecified: Secondary | ICD-10-CM | POA: Diagnosis not present

## 2015-04-09 LAB — HEPATIC FUNCTION PANEL
ALT: 60 U/L — ABNORMAL HIGH (ref 9–46)
AST: 49 U/L — ABNORMAL HIGH (ref 10–35)
Albumin: 4.1 g/dL (ref 3.6–5.1)
Alkaline Phosphatase: 73 U/L (ref 40–115)
Bilirubin, Direct: 0.2 mg/dL (ref <=0.2)
Indirect Bilirubin: 0.8 mg/dL (ref 0.2–1.2)
Total Bilirubin: 1 mg/dL (ref 0.2–1.2)
Total Protein: 6.9 g/dL (ref 6.1–8.1)

## 2015-04-09 LAB — LDL CHOLESTEROL, DIRECT: Direct LDL: 146 mg/dL — ABNORMAL HIGH (ref <130)

## 2015-04-09 LAB — LIPID PANEL
Cholesterol: 197 mg/dL (ref 125–200)
HDL: 30 mg/dL — ABNORMAL LOW (ref >=40)
LDL Cholesterol: 130 mg/dL — ABNORMAL HIGH (ref <130)
Total CHOL/HDL Ratio: 6.6 Ratio — ABNORMAL HIGH (ref <=5.0)
Triglycerides: 185 mg/dL — ABNORMAL HIGH (ref <150)
VLDL: 37 mg/dL — ABNORMAL HIGH (ref <30)

## 2015-04-09 NOTE — Addendum Note (Signed)
Addended by: Eulis Foster on: 04/09/2015 08:02 AM   Modules accepted: Orders

## 2015-04-10 ENCOUNTER — Telehealth: Payer: Self-pay | Admitting: Cardiology

## 2015-04-10 NOTE — Telephone Encounter (Signed)
Scheduled Lipid Clinic appointment 11/22 at 0930. Patient agrees with treatment plan.

## 2015-04-10 NOTE — Telephone Encounter (Signed)
-----   Message from Aris [place], [facility] sent at 04/09/2015  4:48 PM EST ----- RF: CAD, HTN, Age  LDL goal <70, non-HDL goal<100 Current Meds: fenofibrate 160mg , Zetia 10mg  daily, Benecol and fish oil 4 gm daily Intolerances: Crestor (? Dose), Lipitor 10mg  and 20mg  daily, SimCor, Livalo  LDL still above goal.  He has tried several statins and intolerant to all of them.  TG improved slightly with fenofibrate but not significantly.  Would have patient make appt in Lipid clinic to discuss options.

## 2015-04-10 NOTE — Telephone Encounter (Signed)
New problem   Pt returning your call and stated you can leave his results on his vm because he is at work.

## 2015-04-15 ENCOUNTER — Encounter: Payer: Self-pay | Admitting: Pharmacist

## 2015-04-15 ENCOUNTER — Ambulatory Visit (INDEPENDENT_AMBULATORY_CARE_PROVIDER_SITE_OTHER): Payer: Medicare Other | Admitting: Pharmacist

## 2015-04-15 DIAGNOSIS — I6529 Occlusion and stenosis of unspecified carotid artery: Secondary | ICD-10-CM

## 2015-04-15 DIAGNOSIS — E7800 Pure hypercholesterolemia, unspecified: Secondary | ICD-10-CM

## 2015-04-15 NOTE — Progress Notes (Signed)
HPI  [name] is a 69 yo pt of Dr. Radford Pax referred to the lipid clinic for multiple medication intolerances and evaluation for PCSK-9.   He has a history of CAD, HTN and HLD.  He is currently taking  Zetia 10mg  daily, Benecol 2gm/d, and fish oil 4gm/day.  In July, we added fenofibrate due to elevated TG.  Pt reports he was unable to take this due to severe muscle aches in his shoulders and arms. He has intolerances to lipitor 10 and 20mg  daily, Crestor, Livalo 1mg   and SimCor.  All of these caused muscle aches and SimCor also caused an increase in CK.  His LDL was as low as 57 on Lipitor 20mg  daily but unable to tolerate long term.  His LFTs have been elevated no matter if he has been on statin or not.  ? Fatty liver given he does carry extra weight around his abdominal area.  Unsure if this has been evaluated or not.   RF: CAD, HTN, Age  LDL goal <70, non-HDL goal<100 Current Meds:  Zetia 10mg  daily, Benecol and fish oil 4 gm daily Intolerances: Crestor (? Dose), Lipitor 10mg  and 20mg  daily, SimCor, Livalo 1mg , fenofibrate 160mg    Pt currently works in maintenance.  He does not exercise on a regular basis due to neuropathy in his feet.  He does have 4 grandsons between the age of 11 and 64 that he plays with often.   Pt eats a fairly healthy diet.  He has lots of fresh fruits, high fiber foods, and salads for dinner.    LABS:  03/2015: TC 197, TG 185, HDL 30, LDL 130 (Zetia 10mg , benecol and fish oil) 04/2014: TC 177, TG 227, HDL 31, LDL 114 (Livalo 1mg , Zetia 10mg , benecol and fish oil) 02/2014: TC 191, TG 203, HDL 34, LDL 116 (Zetia 10mg , Benecol and fish oil) 12/2013: TC 204, TG 225, HDL 32, LDL 142 (Zetia 10mg , Benecol, and fish oil)   Current Outpatient Prescriptions  Medication Sig Dispense Refill  . aspirin 81 MG tablet Take 81 mg by mouth daily.    Marland Kitchen augmented betamethasone dipropionate (DIPROLENE-AF) 0.05 % cream Apply 1 application topically 2 (two) times daily.    .  calcipotriene-betamethasone (TACLONEX) ointment Apply 1 application topically 2 (two) times daily.    Marland Kitchen doxazosin (CARDURA) 4 MG tablet Take 4 mg by mouth daily.     Marland Kitchen EPINEPHrine (EPIPEN IJ) Inject as directed as needed (Anaphylaxis).     Marland Kitchen ezetimibe (ZETIA) 10 MG tablet TAKE 1 TABLET (10 MG TOTAL) BY MOUTH DAILY. 30 tablet 11  . famotidine (PEPCID) 20 MG tablet Take 1 tablet (20 mg total) by mouth 2 (two) times daily. 10 tablet 0  . fenofibrate 160 MG tablet Take 1 tablet (160 mg total) by mouth daily. 30 tablet 6  . fish oil-omega-3 fatty acids 1000 MG capsule Take 2 g by mouth 2 (two) times daily.     . hydrochlorothiazide (HYDRODIURIL) 25 MG tablet Take 25 mg by mouth daily.    Marland Kitchen KLOR-CON M20 20 MEQ tablet TAKE 1 TABLET (20 MEQ TOTAL) BY MOUTH DAILY. 30 tablet 11  . lisinopril (PRINIVIL,ZESTRIL) 20 MG tablet Take 0.5 tablets (10 mg total) by mouth daily. 30 tablet 3  . metoprolol tartrate (LOPRESSOR) 25 MG tablet TAKE 0.5 TABLETS (12.5 MG TOTAL) BY MOUTH 2 (TWO) TIMES DAILY. 30 tablet 5  . Multiple Vitamin (MULTIVITAMIN) capsule Take 1 capsule by mouth daily.    . nitroGLYCERIN (NITROSTAT) 0.4 MG  SL tablet Place 1 tablet (0.4 mg total) under the tongue every 5 (five) minutes as needed for chest pain. 25 tablet 3  . omeprazole (PRILOSEC) 40 MG capsule Take 1 capsule by mouth daily. Pt is only taking a 20 mg tablet, because insurance will not pay for a 40 mg tablet.    . Plant Stanol Ester (BENECOL PO) Take 1 capsule by mouth 2 (two) times daily.      No current facility-administered medications for this visit.   Allergies  Allergen Reactions  . Erythromycin Base Anaphylaxis and Hives    ANY MED IN THE MYCIN FAMILY  . Other Anaphylaxis    ORGANIC VINEGAR - severe hypotension  . Ace Inhibitors   . Simcor [Niacin-Simvastatin Er]     Elevated CPK   Assessment and Plan 1. Hyperlipidemia- Pt's LDL stable on current regimen. He has now failed most every statin, fenofibrate and Niacin.   Discussed role of PCSK-9 with patient again.  He is willing to do the benefits investigation to see what the cost is and make his decision at that time.  He may qualify for research study if he doesn't get the actual medication.

## 2015-04-16 MED ORDER — ALIROCUMAB 75 MG/ML ~~LOC~~ SOPN: 75.0000 mg | PEN_INJECTOR | SUBCUTANEOUS | Status: DC

## 2015-04-28 ENCOUNTER — Encounter: Payer: Self-pay | Admitting: Pharmacist

## 2015-04-30 ENCOUNTER — Telehealth: Payer: Self-pay | Admitting: Cardiology

## 2015-05-07 ENCOUNTER — Telehealth: Payer: Self-pay | Admitting: Pharmacist

## 2015-05-07 NOTE — Telephone Encounter (Signed)
New problem    They have tried to contact the pt on several occasions and pt hasn't replied back to get the praluent set up. FYI

## 2015-06-06 ENCOUNTER — Other Ambulatory Visit: Payer: Self-pay | Admitting: *Deleted

## 2015-06-06 MED ORDER — LISINOPRIL 20 MG PO TABS: 10.0000 mg | ORAL_TABLET | Freq: Every day | ORAL | Status: DC

## 2015-06-25 DIAGNOSIS — E785 Hyperlipidemia, unspecified: Secondary | ICD-10-CM | POA: Diagnosis not present

## 2015-06-25 DIAGNOSIS — N400 Enlarged prostate without lower urinary tract symptoms: Secondary | ICD-10-CM | POA: Diagnosis not present

## 2015-06-25 DIAGNOSIS — R7301 Impaired fasting glucose: Secondary | ICD-10-CM | POA: Diagnosis not present

## 2015-06-25 DIAGNOSIS — D126 Benign neoplasm of colon, unspecified: Secondary | ICD-10-CM | POA: Diagnosis not present

## 2015-06-25 DIAGNOSIS — Z0000 Encounter for general adult medical examination without abnormal findings: Secondary | ICD-10-CM | POA: Diagnosis not present

## 2015-06-25 DIAGNOSIS — I10 Essential (primary) hypertension: Secondary | ICD-10-CM | POA: Diagnosis not present

## 2015-06-25 DIAGNOSIS — K760 Fatty (change of) liver, not elsewhere classified: Secondary | ICD-10-CM | POA: Diagnosis not present

## 2015-06-25 DIAGNOSIS — I25119 Atherosclerotic heart disease of native coronary artery with unspecified angina pectoris: Secondary | ICD-10-CM | POA: Diagnosis not present

## 2015-06-25 DIAGNOSIS — L409 Psoriasis, unspecified: Secondary | ICD-10-CM | POA: Diagnosis not present

## 2015-06-27 DIAGNOSIS — D126 Benign neoplasm of colon, unspecified: Secondary | ICD-10-CM | POA: Diagnosis not present

## 2015-06-27 DIAGNOSIS — I25119 Atherosclerotic heart disease of native coronary artery with unspecified angina pectoris: Secondary | ICD-10-CM | POA: Diagnosis not present

## 2015-06-27 DIAGNOSIS — R7301 Impaired fasting glucose: Secondary | ICD-10-CM | POA: Diagnosis not present

## 2015-06-27 DIAGNOSIS — E785 Hyperlipidemia, unspecified: Secondary | ICD-10-CM | POA: Diagnosis not present

## 2015-06-27 DIAGNOSIS — K760 Fatty (change of) liver, not elsewhere classified: Secondary | ICD-10-CM | POA: Diagnosis not present

## 2015-06-27 DIAGNOSIS — Z0000 Encounter for general adult medical examination without abnormal findings: Secondary | ICD-10-CM | POA: Diagnosis not present

## 2015-06-27 DIAGNOSIS — L409 Psoriasis, unspecified: Secondary | ICD-10-CM | POA: Diagnosis not present

## 2015-06-27 DIAGNOSIS — I10 Essential (primary) hypertension: Secondary | ICD-10-CM | POA: Diagnosis not present

## 2015-06-27 DIAGNOSIS — N400 Enlarged prostate without lower urinary tract symptoms: Secondary | ICD-10-CM | POA: Diagnosis not present

## 2015-10-07 ENCOUNTER — Other Ambulatory Visit: Payer: Self-pay

## 2015-10-07 MED ORDER — METOPROLOL TARTRATE 25 MG PO TABS: 12.5000 mg | ORAL_TABLET | Freq: Two times a day (BID) | ORAL | Status: DC

## 2015-11-24 ENCOUNTER — Ambulatory Visit (HOSPITAL_COMMUNITY)
Admission: RE | Admit: 2015-11-24 | Discharge: 2015-11-24 | Disposition: A | Discharge status: Home / Self Care | Payer: Medicare Other | Source: Ambulatory Visit | Attending: Internal Medicine | Admitting: Internal Medicine

## 2015-11-24 DIAGNOSIS — E7800 Pure hypercholesterolemia, unspecified: Secondary | ICD-10-CM | POA: Insufficient documentation

## 2015-11-24 DIAGNOSIS — I6529 Occlusion and stenosis of unspecified carotid artery: Secondary | ICD-10-CM

## 2015-11-24 DIAGNOSIS — I10 Essential (primary) hypertension: Secondary | ICD-10-CM | POA: Insufficient documentation

## 2015-11-24 DIAGNOSIS — I2510 Atherosclerotic heart disease of native coronary artery without angina pectoris: Secondary | ICD-10-CM | POA: Diagnosis not present

## 2015-11-24 DIAGNOSIS — K219 Gastro-esophageal reflux disease without esophagitis: Secondary | ICD-10-CM | POA: Diagnosis not present

## 2015-11-24 DIAGNOSIS — I6523 Occlusion and stenosis of bilateral carotid arteries: Secondary | ICD-10-CM | POA: Insufficient documentation

## 2015-11-26 ENCOUNTER — Encounter: Payer: Self-pay | Admitting: Cardiology

## 2015-11-26 ENCOUNTER — Ambulatory Visit (INDEPENDENT_AMBULATORY_CARE_PROVIDER_SITE_OTHER): Payer: Medicare Other | Admitting: Cardiology

## 2015-11-26 VITALS — BP 110/60 | HR 68 | Ht 69.0 in | Wt 203.0 lb

## 2015-11-26 DIAGNOSIS — E7800 Pure hypercholesterolemia, unspecified: Secondary | ICD-10-CM

## 2015-11-26 DIAGNOSIS — I2510 Atherosclerotic heart disease of native coronary artery without angina pectoris: Secondary | ICD-10-CM

## 2015-11-26 DIAGNOSIS — I6529 Occlusion and stenosis of unspecified carotid artery: Secondary | ICD-10-CM | POA: Diagnosis not present

## 2015-11-26 DIAGNOSIS — I10 Essential (primary) hypertension: Secondary | ICD-10-CM

## 2015-11-26 DIAGNOSIS — I2583 Coronary atherosclerosis due to lipid rich plaque: Principal | ICD-10-CM

## 2015-11-26 DIAGNOSIS — I6523 Occlusion and stenosis of bilateral carotid arteries: Secondary | ICD-10-CM

## 2015-11-26 NOTE — Progress Notes (Addendum)
Cardiology Office Note    Date:  11/26/2015   ID:  Kenneth Poole, DOB 1945/09/07, MRN [id number]  PCP:  Gennette Pac, MD  Cardiologist:  Fransico Him, MD   Chief Complaint  Patient presents with  . Shortness of Breath  . Hypertension  . Hyperlipidemia    History of Present Illness:  Kenneth Poole is a 70 y.o. male with a history of ASCAD, HTN and dyslipidemia who presents today for followup. He is doing well. He denies any anginal chest pain or pressure, SOB, DOE, dizziness, palpitations or syncope. He has chronic LE edema which is stable.He was having weakness in his extremities that resolved after stopping Livalo.     Past Medical History  Diagnosis Date  . Hypertension   . Hypercholesteremia   . Obesity   . GERD (gastroesophageal reflux disease)   . Diverticulosis   . Dermatitis   . Dyslipidemia   . Hypercholesteremia   . Urticaria   . Psoriasis   . Back pain   . Peripheral neuropathy ([place])   . Coronary artery disease     s.p PTCA of circumflex lesion in 2004, cardiologist Dr Radford Pax, nonobstructive CAD on CATH in June 2009 with 50% LAD  . Carotid artery occlusion     20-30% stenosis of RICA and less than 20% left ICA stenosis    Past Surgical History  Procedure Laterality Date  . Coronary stent placement    . Cholecystectomy    . Appendectomy    . Tonsillectomy    . Back surgery    . Kidney stone surgery    . Cardiac catheterization  June 2009    Current Medications: Outpatient Prescriptions Prior to Visit  Medication Sig Dispense Refill  . Alirocumab (PRALUENT) 75 MG/ML SOPN Inject 75 mg into the skin every 14 (fourteen) days. 2 pen 6  . aspirin 81 MG tablet Take 81 mg by mouth daily.    Marland Kitchen augmented betamethasone dipropionate (DIPROLENE-AF) 0.05 % cream Apply 1 application topically 2 (two) times daily.    . calcipotriene-betamethasone (TACLONEX) ointment Apply 1 application topically 2 (two) times daily.    Marland Kitchen doxazosin (CARDURA) 4 MG  tablet Take 4 mg by mouth daily.     Marland Kitchen EPINEPHrine (EPIPEN IJ) Inject as directed as needed (Anaphylaxis).     Marland Kitchen ezetimibe (ZETIA) 10 MG tablet TAKE 1 TABLET (10 MG TOTAL) BY MOUTH DAILY. 30 tablet 11  . famotidine (PEPCID) 20 MG tablet Take 1 tablet (20 mg total) by mouth 2 (two) times daily. 10 tablet 0  . fish oil-omega-3 fatty acids 1000 MG capsule Take 2 g by mouth 2 (two) times daily.     . hydrochlorothiazide (HYDRODIURIL) 25 MG tablet Take 25 mg by mouth daily.    Marland Kitchen KLOR-CON M20 20 MEQ tablet TAKE 1 TABLET (20 MEQ TOTAL) BY MOUTH DAILY. 30 tablet 11  . lisinopril (PRINIVIL,ZESTRIL) 20 MG tablet Take 0.5 tablets (10 mg total) by mouth daily. 15 tablet 5  . metoprolol tartrate (LOPRESSOR) 25 MG tablet Take 0.5 tablets (12.5 mg total) by mouth 2 (two) times daily. 30 tablet 2  . Multiple Vitamin (MULTIVITAMIN) capsule Take 1 capsule by mouth daily.    . nitroGLYCERIN (NITROSTAT) 0.4 MG SL tablet Place 1 tablet (0.4 mg total) under the tongue every 5 (five) minutes as needed for chest pain. 25 tablet 3  . omeprazole (PRILOSEC) 20 MG capsule Take 20 mg by mouth daily. Insurance will not cover the 40mg  capsule.    Marland Kitchen  Plant Stanol Ester (BENECOL PO) Take 1 capsule by mouth 2 (two) times daily.     . fenofibrate 160 MG tablet Take 1 tablet (160 mg total) by mouth daily. 30 tablet 6  . omeprazole (PRILOSEC) 40 MG capsule Take 1 capsule by mouth daily. Pt is only taking a 20 mg tablet, because insurance will not pay for a 40 mg tablet.     No facility-administered medications prior to visit.     Allergies:   Erythromycin base; Other; Ace inhibitors; Fenofibrate; and Simcor   Social History   Social History  . Marital Status: Married    Spouse Name: N/A  . Number of Children: N/A  . Years of Education: N/A   Social History Main Topics  . Smoking status: Former Smoker    Quit date: 05/24/1989  . Smokeless tobacco: None  . Alcohol Use: No  . Drug Use: No  . Sexual Activity: Not Asked    Other Topics Concern  . None   Social History Narrative     Family History:  The patient's family history includes Alzheimer's disease in his mother; Heart attack in his father.   ROS:   Please see the history of present illness.    ROS  All other systems reviewed and are negative.   PHYSICAL EXAM:   VS:  BP 110/60 mmHg  Pulse 68  Ht 5\' 9"  (1.753 m)  Wt 203 lb (92.08 kg)  BMI 29.96 kg/m2   GEN: Well nourished, well developed, in no acute distress HEENT: normal Neck: no JVD, carotid bruits, or masses Cardiac: RRR; no murmurs, rubs, or gallops,no edema.  Intact distal pulses bilaterally.  Respiratory:  clear to auscultation bilaterally, normal work of breathing GI: soft, nontender, nondistended, + BS MS: no deformity or atrophy Skin: warm and dry, no rash Neuro:  Alert and Oriented x 3, Strength and sensation are intact Psych: euthymic mood, full affect  Wt Readings from Last 3 Encounters:  11/26/15 203 lb (92.08 kg)  11/18/14 212 lb 3.2 oz (96.253 kg)  05/21/14 210 lb (95.255 kg)      Studies/Labs Reviewed:   EKG:  EKG is  ordered today and showed NSR at 68bpm with no ST changes Recent Labs: 04/09/2015: ALT 60*   Lipid Panel    Component Value Date/Time   CHOL 197 04/09/2015 0802   TRIG 185* 04/09/2015 0802   HDL 30* 04/09/2015 0802   CHOLHDL 6.6* 04/09/2015 0802   VLDL 37* 04/09/2015 0802   LDLCALC 130* 04/09/2015 0802   LDLDIRECT 146* 04/09/2015 0802    Additional studies/ records that were reviewed today include:  none    ASSESSMENT:    1. Coronary artery disease due to lipid rich plaque   2. Essential hypertension   3. Carotid artery occlusion, bilateral   4. Hypercholesteremia      PLAN:  In order of problems listed above:  1. ASCAD with no angina.  Continue ASA/BB/statin.   2. HTN - BP well controlled on doxazosin, diuretic, ACE I, BB. 3. Carotid artery stenosis - repeat carotid dopplers. Continue ASA/statin.  Repeat dopplers pending.   4. Hyperlipidemia - LDL goal is < 70.  Continue fenofibratezetia. He is stating intolerant.   Last LDL was 130.  Will recheck FLP today and if not at goal refer back to lipid clinic for possible PCSK 9 drug    Medication Adjustments/Labs and Tests Ordered: Current medicines are reviewed at length with the patient today.  Concerns regarding medicines are outlined  above.  Medication changes, Labs and Tests ordered today are listed in the Patient Instructions below.  There are no Patient Instructions on file for this visit.   Signed, Fransico Him, MD  11/26/2015 8:22 AM    [facility] [address] [place], [place], [place]  [address] Phone: [phone]; Fax: [phone]

## 2015-11-26 NOTE — Patient Instructions (Addendum)
Medication Instructions:  Your physician recommends that you continue on your current medications as directed. Please refer to the Current Medication list given to you today.  Labwork: Your physician recommends that you return for FASTING lab work TOMORROW, 7/ 6.  Testing/Procedures: None  Follow-Up: Your physician wants you to follow-up in: 1 year with Dr. Radford Pax. You will receive a reminder letter in the mail two months in advance. If you don't receive a letter, please call our office to schedule the follow-up appointment.   Any Other Special Instructions Will Be Listed Below (If Applicable).     If you need a refill on your cardiac medications before your next appointment, please call your pharmacy.

## 2015-11-27 ENCOUNTER — Other Ambulatory Visit (INDEPENDENT_AMBULATORY_CARE_PROVIDER_SITE_OTHER): Payer: Medicare Other | Admitting: *Deleted

## 2015-11-27 DIAGNOSIS — I10 Essential (primary) hypertension: Secondary | ICD-10-CM | POA: Diagnosis not present

## 2015-11-27 DIAGNOSIS — E7800 Pure hypercholesterolemia, unspecified: Secondary | ICD-10-CM | POA: Diagnosis not present

## 2015-11-27 LAB — BASIC METABOLIC PANEL
BUN: 12 mg/dL (ref 7–25)
CO2: 27 mmol/L (ref 20–31)
Calcium: 9 mg/dL (ref 8.6–10.3)
Chloride: 104 mmol/L (ref 98–110)
Creat: 0.86 mg/dL (ref 0.70–1.25)
Glucose, Bld: 107 mg/dL — ABNORMAL HIGH (ref 65–99)
Potassium: 4.7 mmol/L (ref 3.5–5.3)
Sodium: 139 mmol/L (ref 135–146)

## 2015-11-27 LAB — HEPATIC FUNCTION PANEL
ALT: 44 U/L (ref 9–46)
AST: 39 U/L — ABNORMAL HIGH (ref 10–35)
Albumin: 4.2 g/dL (ref 3.6–5.1)
Alkaline Phosphatase: 67 U/L (ref 40–115)
Bilirubin, Direct: 0.1 mg/dL (ref <=0.2)
Indirect Bilirubin: 1 mg/dL (ref 0.2–1.2)
Total Bilirubin: 1.1 mg/dL (ref 0.2–1.2)
Total Protein: 6.3 g/dL (ref 6.1–8.1)

## 2015-11-27 LAB — LIPID PANEL
Cholesterol: 202 mg/dL — ABNORMAL HIGH (ref 125–200)
HDL: 34 mg/dL — ABNORMAL LOW (ref >=40)
LDL Cholesterol: 130 mg/dL — ABNORMAL HIGH (ref <130)
Total CHOL/HDL Ratio: 5.9 Ratio — ABNORMAL HIGH (ref <=5.0)
Triglycerides: 190 mg/dL — ABNORMAL HIGH (ref <150)
VLDL: 38 mg/dL — ABNORMAL HIGH (ref <30)

## 2015-11-28 NOTE — Addendum Note (Signed)
Addended by: Freada Bergeron on: 11/28/2015 03:31 PM   Modules accepted: Orders

## 2015-12-02 ENCOUNTER — Telehealth: Payer: Self-pay

## 2015-12-02 DIAGNOSIS — I6523 Occlusion and stenosis of bilateral carotid arteries: Secondary | ICD-10-CM

## 2015-12-02 NOTE — Telephone Encounter (Signed)
-----   Message from Sueanne Margarita, MD sent at 11/26/2015  9:46 PM EDT ----- 40-59% right and 1-39% left carotid artery stenosis - repeat dopplers in 1 year

## 2015-12-02 NOTE — Telephone Encounter (Signed)
Left message for patient that results have been released on MyChart. Released message: "Per Dr. Radford Pax, your carotid arteries are stable! You have moderate plaquing in the right and minimal to mild plaquing in the left. This is the same as last year. We will get another ultrasound in 1 year to monitor.  Do not hesitate to call if you have any questions or concerns!"

## 2015-12-14 ENCOUNTER — Other Ambulatory Visit: Payer: Self-pay | Admitting: Cardiology

## 2015-12-19 ENCOUNTER — Other Ambulatory Visit: Payer: Self-pay | Admitting: Pharmacist

## 2015-12-22 ENCOUNTER — Other Ambulatory Visit: Payer: Self-pay | Admitting: Cardiology

## 2016-01-07 ENCOUNTER — Other Ambulatory Visit: Payer: Self-pay | Admitting: Cardiology

## 2016-01-23 ENCOUNTER — Telehealth: Payer: Self-pay | Admitting: Pharmacist

## 2016-01-23 MED ORDER — ROSUVASTATIN CALCIUM 5 MG PO TABS: 5.0000 mg | ORAL_TABLET | Freq: Every day | ORAL | 3 refills | Status: DC

## 2016-01-23 NOTE — Telephone Encounter (Signed)
Pt called to ask if there was any alternative to PCSK-9 inhibitors due to the cost.  Reviewed his history.  We do not have any dosing on Crestor.  He is willing to retry Crestor.  Will start with 5mg  twice a week.  Pt will let us know if he tolerates this dose so we can set up labwork in the next 2 months.

## 2016-02-09 ENCOUNTER — Other Ambulatory Visit: Payer: Self-pay | Admitting: Cardiology

## 2016-02-13 DIAGNOSIS — Z23 Encounter for immunization: Secondary | ICD-10-CM | POA: Diagnosis not present

## 2016-04-12 NOTE — Telephone Encounter (Signed)
Scheduled Pt for 04/01/2016 CLEAR screening visit at 0800.

## 2016-04-12 NOTE — Addendum Note (Signed)
Addended by: Rubee Vega E on: 04/12/2016 08:59 AM   Modules accepted: Orders

## 2016-04-12 NOTE — Telephone Encounter (Signed)
Called patient to discuss tolerability of Crestor. He reports that after 3-4 weeks, he developed severe myalgias in his arms and legs and has since stopped taking Crestor. See OV note from 04/15/2015 for details regarding other statin intolerances. PCSK9i cost prohibitive. Patient is interested in clinical trials. Will route info over to our research team to prescreen patient for either neurocog or CLEAR clinical trials.

## 2016-04-21 NOTE — Telephone Encounter (Signed)
Received message from [place] that pt was screened for CLEAR and did not want to participate.After reading the consent he and his wife thought it would be best not to enroll in the study. He was very appreciative of the opportunity.

## 2016-05-13 DIAGNOSIS — R749 Abnormal serum enzyme level, unspecified: Secondary | ICD-10-CM | POA: Diagnosis not present

## 2016-05-13 DIAGNOSIS — R7301 Impaired fasting glucose: Secondary | ICD-10-CM | POA: Diagnosis not present

## 2016-06-18 DIAGNOSIS — R1032 Left lower quadrant pain: Secondary | ICD-10-CM | POA: Diagnosis not present

## 2016-07-08 DIAGNOSIS — L409 Psoriasis, unspecified: Secondary | ICD-10-CM | POA: Diagnosis not present

## 2016-07-08 DIAGNOSIS — I25119 Atherosclerotic heart disease of native coronary artery with unspecified angina pectoris: Secondary | ICD-10-CM | POA: Diagnosis not present

## 2016-07-08 DIAGNOSIS — N400 Enlarged prostate without lower urinary tract symptoms: Secondary | ICD-10-CM | POA: Diagnosis not present

## 2016-07-08 DIAGNOSIS — K760 Fatty (change of) liver, not elsewhere classified: Secondary | ICD-10-CM | POA: Diagnosis not present

## 2016-07-08 DIAGNOSIS — Z0000 Encounter for general adult medical examination without abnormal findings: Secondary | ICD-10-CM | POA: Diagnosis not present

## 2016-07-08 DIAGNOSIS — R7301 Impaired fasting glucose: Secondary | ICD-10-CM | POA: Diagnosis not present

## 2016-07-08 DIAGNOSIS — I10 Essential (primary) hypertension: Secondary | ICD-10-CM | POA: Diagnosis not present

## 2016-07-08 DIAGNOSIS — L570 Actinic keratosis: Secondary | ICD-10-CM | POA: Diagnosis not present

## 2016-07-08 DIAGNOSIS — Z125 Encounter for screening for malignant neoplasm of prostate: Secondary | ICD-10-CM | POA: Diagnosis not present

## 2016-07-20 DIAGNOSIS — N402 Nodular prostate without lower urinary tract symptoms: Secondary | ICD-10-CM | POA: Diagnosis not present

## 2016-08-03 DIAGNOSIS — R194 Change in bowel habit: Secondary | ICD-10-CM | POA: Diagnosis not present

## 2016-08-04 DIAGNOSIS — R194 Change in bowel habit: Secondary | ICD-10-CM | POA: Diagnosis not present

## 2016-08-11 ENCOUNTER — Other Ambulatory Visit: Payer: Self-pay

## 2016-08-11 MED ORDER — LISINOPRIL 20 MG PO TABS: 10.0000 mg | ORAL_TABLET | Freq: Every day | ORAL | 1 refills | Status: DC

## 2016-12-02 ENCOUNTER — Other Ambulatory Visit: Payer: Self-pay | Admitting: Cardiology

## 2016-12-21 ENCOUNTER — Encounter: Payer: Self-pay | Admitting: Cardiology

## 2016-12-21 ENCOUNTER — Ambulatory Visit (HOSPITAL_COMMUNITY)
Admission: RE | Admit: 2016-12-21 | Discharge: 2016-12-21 | Disposition: A | Discharge status: Home / Self Care | Payer: Medicare Other | Source: Ambulatory Visit | Attending: Cardiovascular Disease | Admitting: Cardiovascular Disease

## 2016-12-21 DIAGNOSIS — I6523 Occlusion and stenosis of bilateral carotid arteries: Secondary | ICD-10-CM | POA: Diagnosis not present

## 2016-12-29 ENCOUNTER — Other Ambulatory Visit: Payer: Self-pay | Admitting: Cardiology

## 2017-01-03 ENCOUNTER — Encounter: Payer: Self-pay | Admitting: Cardiology

## 2017-01-20 ENCOUNTER — Telehealth (HOSPITAL_COMMUNITY): Payer: Self-pay | Admitting: *Deleted

## 2017-01-20 ENCOUNTER — Ambulatory Visit (INDEPENDENT_AMBULATORY_CARE_PROVIDER_SITE_OTHER): Payer: Medicare Other | Admitting: Cardiology

## 2017-01-20 ENCOUNTER — Encounter: Payer: Self-pay | Admitting: Cardiology

## 2017-01-20 VITALS — BP 114/68 | HR 60 | Ht 69.0 in | Wt 200.6 lb

## 2017-01-20 DIAGNOSIS — I2510 Atherosclerotic heart disease of native coronary artery without angina pectoris: Secondary | ICD-10-CM | POA: Diagnosis not present

## 2017-01-20 DIAGNOSIS — E7800 Pure hypercholesterolemia, unspecified: Secondary | ICD-10-CM

## 2017-01-20 DIAGNOSIS — R079 Chest pain, unspecified: Secondary | ICD-10-CM

## 2017-01-20 DIAGNOSIS — I10 Essential (primary) hypertension: Secondary | ICD-10-CM

## 2017-01-20 DIAGNOSIS — I6523 Occlusion and stenosis of bilateral carotid arteries: Secondary | ICD-10-CM | POA: Diagnosis not present

## 2017-01-20 LAB — BASIC METABOLIC PANEL
BUN/Creatinine Ratio: 10 (ref 10–24)
BUN: 10 mg/dL (ref 8–27)
CO2: 23 mmol/L (ref 20–29)
Calcium: 9.5 mg/dL (ref 8.6–10.2)
Chloride: 101 mmol/L (ref 96–106)
Creatinine, Ser: 0.96 mg/dL (ref 0.76–1.27)
GFR calc Af Amer: 93 mL/min/{1.73_m2} (ref >59)
GFR calc non Af Amer: 80 mL/min/{1.73_m2} (ref >59)
Glucose: 109 mg/dL — ABNORMAL HIGH (ref 65–99)
Potassium: 4.8 mmol/L (ref 3.5–5.2)
Sodium: 140 mmol/L (ref 134–144)

## 2017-01-20 LAB — LIPID PANEL
Chol/HDL Ratio: 5.9 ratio — ABNORMAL HIGH (ref 0.0–5.0)
Cholesterol, Total: 207 mg/dL — ABNORMAL HIGH (ref 100–199)
HDL: 35 mg/dL — ABNORMAL LOW (ref >39)
LDL Calculated: 138 mg/dL — ABNORMAL HIGH (ref 0–99)
Triglycerides: 168 mg/dL — ABNORMAL HIGH (ref 0–149)
VLDL Cholesterol Cal: 34 mg/dL (ref 5–40)

## 2017-01-20 LAB — HEPATIC FUNCTION PANEL
ALT: 70 IU/L — ABNORMAL HIGH (ref 0–44)
AST: 54 IU/L — ABNORMAL HIGH (ref 0–40)
Albumin: 4.5 g/dL (ref 3.6–4.8)
Alkaline Phosphatase: 79 IU/L (ref 39–117)
Bilirubin Total: 1.2 mg/dL (ref 0.0–1.2)
Bilirubin, Direct: 0.2 mg/dL (ref 0.00–0.40)
Total Protein: 6.9 g/dL (ref 6.0–8.5)

## 2017-01-20 NOTE — Telephone Encounter (Signed)
Patient given detailed instructions per Myocardial Perfusion Study Information Sheet for the test on 01/25/17 Patient notified to arrive 15 minutes early and that it is imperative to arrive on time for appointment to keep from having the test rescheduled.  If you need to cancel or reschedule your appointment, please call the office within 24 hours of your appointment. . Patient verbalized understanding. Kenneth Poole

## 2017-01-20 NOTE — Progress Notes (Signed)
Cardiology Office Note:    Date:  01/20/2017   ID:  Kenneth Poole, DOB Dec 15, 1945, MRN [id number]  PCP:  Hulan Fess, MD  Cardiologist:  Fransico Him, MD   Referring MD: Hulan Fess, MD   Chief Complaint  Patient presents with  . Coronary Artery Disease  . Hypertension  . Hyperlipidemia    History of Present Illness:    Kenneth Poole is a 71 y.o. male with a hx of ASCAD s/p PTCA of circumflex lesion in 2004 and cath in June 2009 with 50% LAD.  He also has HTN and dyslipidemia.  He is here today for followup and is doing well. He says that occasionally he will feel a tightness in the mid part of his chest lasting a minutes and then resolves.  There is no radiation of the discomfort, nausea or diaphoresis.  It usually occurs when he mows and at other times.  He has also noticed some DOE when walking up hills and bending over pulling weeks in his garden.  He denies any PND, orthopnea, dizziness, palpitations or syncope. He has no LE edema.    Past Medical History:  Diagnosis Date  . Back pain   . Carotid artery occlusion    40-59% stenosis of RICA and 1-39%  left ICA stenosis  . Coronary artery disease    s.p PTCA of circumflex lesion in 2004, cardiologist Dr Radford Pax, nonobstructive CAD on CATH in June 2009 with 50% LAD  . Dermatitis   . Diverticulosis   . Dyslipidemia   . GERD (gastroesophageal reflux disease)   . Hypercholesteremia   . Hypercholesteremia   . Hypertension   . Obesity   . Peripheral neuropathy   . Psoriasis   . Urticaria     Past Surgical History:  Procedure Laterality Date  . APPENDECTOMY    . BACK SURGERY    . CARDIAC CATHETERIZATION  June 2009  . CHOLECYSTECTOMY    . CORONARY STENT PLACEMENT    . KIDNEY STONE SURGERY    . TONSILLECTOMY      Current Medications: Current Meds  Medication Sig  . aspirin 81 MG tablet Take 81 mg by mouth daily.  Marland Kitchen augmented betamethasone dipropionate (DIPROLENE-AF) 0.05 % cream Apply 1 application  topically 2 (two) times daily.  . calcipotriene-betamethasone (TACLONEX) ointment Apply 1 application topically 2 (two) times daily.  Marland Kitchen doxazosin (CARDURA) 4 MG tablet Take 4 mg by mouth daily.   Marland Kitchen EPINEPHrine (EPIPEN IJ) Inject as directed as needed (Anaphylaxis).   Marland Kitchen ezetimibe (ZETIA) 10 MG tablet TAKE ONE TABLET BY MOUTH ONCE DAILY  . fish oil-omega-3 fatty acids 1000 MG capsule Take 2 g by mouth 2 (two) times daily.   . hydrochlorothiazide (HYDRODIURIL) 25 MG tablet Take 25 mg by mouth daily.  Marland Kitchen ketoconazole (NIZORAL) 2 % cream Apply 1 application topically daily.  Marland Kitchen KLOR-CON M20 20 MEQ tablet TAKE ONE TABLET BY MOUTH ONCE DAILY  . lisinopril (PRINIVIL,ZESTRIL) 20 MG tablet Take 0.5 tablets (10 mg total) by mouth daily.  . metoprolol tartrate (LOPRESSOR) 25 MG tablet TAKE ONE-HALF TABLET BY MOUTH TWICE DAILY  . Multiple Vitamin (MULTIVITAMIN) capsule Take 1 capsule by mouth daily.  . nitroGLYCERIN (NITROSTAT) 0.4 MG SL tablet Place 1 tablet (0.4 mg total) under the tongue every 5 (five) minutes as needed for chest pain.  Marland Kitchen omeprazole (PRILOSEC) 20 MG capsule Take 20 mg by mouth daily. Insurance will not cover the 40mg  capsule.     Allergies:   Crestor [  rosuvastatin calcium]; Erythromycin base; Other; Ace inhibitors; Fenofibrate; and Simcor [niacin-simvastatin er]   Social History   Social History  . Marital status: Married    Spouse name: N/A  . Number of children: N/A  . Years of education: N/A   Social History Main Topics  . Smoking status: Former Smoker    Quit date: 05/24/1989  . Smokeless tobacco: Never Used  . Alcohol use No  . Drug use: No  . Sexual activity: Not Asked   Other Topics Concern  . None   Social History Narrative  . None     Family History: The patient's family history includes Alzheimer's disease in his mother; Heart attack in his father.  ROS:   Please see the history of present illness.     All other systems reviewed and are  negative.  EKGs/Labs/Other Studies Reviewed:    The following studies were reviewed today: none  EKG:  EKG is  ordered today.  The ekg ordered today demonstrates NSR with no ST changes  Recent Labs: No results found for requested labs within last 8760 hours.   Recent Lipid Panel    Component Value Date/Time   CHOL 202 (H) 11/27/2015 0801   TRIG 190 (H) 11/27/2015 0801   HDL 34 (L) 11/27/2015 0801   CHOLHDL 5.9 (H) 11/27/2015 0801   VLDL 38 (H) 11/27/2015 0801   LDLCALC 130 (H) 11/27/2015 0801   LDLDIRECT 146 (H) 04/09/2015 0802    Physical Exam:    VS:  BP 114/68   Pulse 60   Ht 5\' 9"  (1.753 m)   Wt 200 lb 9.6 oz (91 kg)   BMI 29.62 kg/m     Wt Readings from Last 3 Encounters:  01/20/17 200 lb 9.6 oz (91 kg)  11/26/15 203 lb (92.1 kg)  11/18/14 212 lb 3.2 oz (96.3 kg)     GEN:  Well nourished, well developed in no acute distress HEENT: Normal NECK: No JVD; No carotid bruits LYMPHATICS: No lymphadenopathy CARDIAC: RRR, no murmurs, rubs, gallops RESPIRATORY:  Clear to auscultation without rales, wheezing or rhonchi  ABDOMEN: Soft, non-tender, non-distended MUSCULOSKELETAL:  No edema; No deformity  SKIN: Warm and dry NEUROLOGIC:  Alert and oriented x 3 PSYCHIATRIC:  Normal affect   ASSESSMENT:    1. Coronary artery disease involving native coronary artery of native heart without angina pectoris   2. Essential hypertension   3. Bilateral carotid artery occlusion   4. Hypercholesteremia    PLAN:    In order of problems listed above:  1.  ASCAD - s/p PTCA of LCx 2004 and repeat cath 6/09 with nonobstructive disease with 50% LAD.  He has had some exertional CP since I saw him last that is now occurring twice weekly. I am going to get a stress test to rule out ischemia.  He has a neuropathy in his legs so I will make it a [employer].   He will continue on ASA and BB.  He is statin intolerant.    2.  HTN - his BP is well controlled on exam today.  He will  continue on Lisinopril 20mg  daily, doxzosin 4mg  daily and HCTZ 25mg  daily.  I will check a BMET.  3.  Bilateral carotid artery stenosis (40-50% right and 1-39% left by dopplers 7/18) - repeat in 1 year.  Continue on ASA.  4.  Hyperlipidemia with LDL goal < 70.  He will continue on Zetia.  He is statin intolerant.  I will repeat  FLP and ALT.    Medication Adjustments/Labs and Tests Ordered: Current medicines are reviewed at length with the patient today.  Concerns regarding medicines are outlined above.  No orders of the defined types were placed in this encounter.  No orders of the defined types were placed in this encounter.   Signed, Fransico Him, MD  01/20/2017 8:04 AM    [place]

## 2017-01-20 NOTE — Patient Instructions (Signed)
Medication Instructions:  Your provider recommends that you continue on your current medications as directed. Please refer to the Current Medication list given to you today.    Labwork: TODAY: BMET, LFTs, Lipids  Testing/Procedures: Your provider has requested that you have a lexiscan myoview. For further information please visit [email or web address]. Please follow instruction sheet, as given.  Follow-Up: Your provider wants you to follow-up in: 1 year with Dr. Radford Pax. You will receive a reminder letter in the mail two months in advance. If you don't receive a letter, please call our office to schedule the follow-up appointment.    Any Other Special Instructions Will Be Listed Below (If Applicable).     If you need a refill on your cardiac medications before your next appointment, please call your pharmacy.

## 2017-01-25 ENCOUNTER — Ambulatory Visit (HOSPITAL_COMMUNITY): Payer: Medicare Other | Attending: Cardiology

## 2017-01-25 DIAGNOSIS — R079 Chest pain, unspecified: Secondary | ICD-10-CM

## 2017-01-25 DIAGNOSIS — I2510 Atherosclerotic heart disease of native coronary artery without angina pectoris: Secondary | ICD-10-CM | POA: Insufficient documentation

## 2017-01-25 LAB — MYOCARDIAL PERFUSION IMAGING
LV dias vol: 119 mL (ref 62–150)
LV sys vol: 41 mL
Peak HR: 90 {beats}/min
RATE: 0.32
Rest HR: 61 {beats}/min
SDS: 1
SRS: 2
SSS: 3
TID: 0.86

## 2017-01-25 MED ORDER — TECHNETIUM TC 99M TETROFOSMIN IV KIT
32.9000 | PACK | Freq: Once | INTRAVENOUS | Status: AC | PRN
  Administered 2017-01-25: 32.9 via INTRAVENOUS
  Filled 2017-01-25: qty 33

## 2017-01-25 MED ORDER — TECHNETIUM TC 99M TETROFOSMIN IV KIT
10.6000 | PACK | Freq: Once | INTRAVENOUS | Status: AC | PRN
  Administered 2017-01-25: 10.6 via INTRAVENOUS
  Filled 2017-01-25: qty 11

## 2017-01-25 MED ORDER — REGADENOSON 0.4 MG/5ML IV SOLN
0.4000 mg | Freq: Once | INTRAVENOUS | Status: AC
  Administered 2017-01-25: 0.4 mg via INTRAVENOUS

## 2017-01-26 ENCOUNTER — Encounter: Payer: Self-pay | Admitting: Cardiology

## 2017-02-02 ENCOUNTER — Other Ambulatory Visit: Payer: Self-pay | Admitting: Cardiology

## 2017-02-11 ENCOUNTER — Telehealth: Payer: Self-pay | Admitting: Cardiology

## 2017-02-11 ENCOUNTER — Ambulatory Visit (INDEPENDENT_AMBULATORY_CARE_PROVIDER_SITE_OTHER): Payer: Medicare Other | Admitting: Pharmacist

## 2017-02-11 DIAGNOSIS — I6523 Occlusion and stenosis of bilateral carotid arteries: Secondary | ICD-10-CM

## 2017-02-11 DIAGNOSIS — E7800 Pure hypercholesterolemia, unspecified: Secondary | ICD-10-CM | POA: Diagnosis not present

## 2017-02-11 MED ORDER — ALIROCUMAB 75 MG/ML ~~LOC~~ SOPN: 1.0000 "pen " | PEN_INJECTOR | SUBCUTANEOUS | 11 refills | Status: DC

## 2017-02-11 NOTE — Telephone Encounter (Signed)
New message   Pt calling to let Jinny Blossom know the amount paid out in drugs was $385.00. Pt states no call back needed.

## 2017-02-11 NOTE — Progress Notes (Signed)
Patient ID: Kenneth Poole                 DOB: 1945-11-26                    MRN: [id number]     HPI: Kenneth Poole is a 71 y.o. male patient referred to lipid clinic by Dr Radford Pax. PMH is significant for ASCVD s/p PTCA of cirucmflex lesion in 2004 and cath in June 2009 with 50% LAD, HTN, and HLD. Carotid scan done in July 2018 revealed 40-59% right carotid stenosis and 1-39% left carotid stenosis. Patient has a history of statin intolerance and presents to lipid clinic for further management. He was previously approved for Praluent in 2016 but the cost was prohibitive.  Pt has tried multiple statins including Livalo 1mg  daily, Crestor, Lipitor 10mg  and 20mg  daily but he experienced myalgias and weakness in his legs and shoulders with each statin. These symptoms appeared within 1 week of statin initiation and discontinued upon drug discontinuation. He has also taken Simcor which increased his CK. His LFTs have been mildly chronically elevated no matter if he has been on statin or not.   He is currently tolerating Zetia 10mg  daily and fish oil 4g daily without issues. Now that we have patient assistance options available, pt would like to pursue PCSK9i therapy again and see if it is more affordable.  Current Medications: Zetia 10mg  daily, fish oil 4g daily Intolerances: Livalo 1mg  daily, Crestor ? Dose, Lipitor 10mg  and 20mg  daily - myalgias with all, Simcor - elevated CK Risk Factors: CAD, HTN, age LDL goal: 70mg /dL  Diet: Usually only eats supper. Chicken and rice, sandwiches. Typically eats a meat, starch, and vegetable. Limits red meat. Drinks Gatorade and sweet tea.   Exercise: Works outside in the yard, uses a Chiropractor.   Family History: Heart attack in his father.  Social History: Former smoker, quit in 1991. Denies alcohol and tobacco use.  Labs: 01/20/17: TC 207, TG 168, HDL 35, LDL 138 (Zetia 10mg  daily, fish oil 4g daily)  Past Medical History:  Diagnosis Date  . Back  pain   . Carotid artery occlusion    40-59% stenosis of RICA and 1-39%  left ICA stenosis  . Coronary artery disease    s.p PTCA of circumflex lesion in 2004, cardiologist Dr Radford Pax, nonobstructive CAD on CATH in June 2009 with 50% LAD  . Dermatitis   . Diverticulosis   . Dyslipidemia   . GERD (gastroesophageal reflux disease)   . Hypercholesteremia   . Hypercholesteremia   . Hypertension   . Obesity   . Peripheral neuropathy   . Psoriasis   . Urticaria     Current Outpatient Prescriptions on File Prior to Visit  Medication Sig Dispense Refill  . aspirin 81 MG tablet Take 81 mg by mouth daily.    Marland Kitchen augmented betamethasone dipropionate (DIPROLENE-AF) 0.05 % cream Apply 1 application topically 2 (two) times daily.    . calcipotriene-betamethasone (TACLONEX) ointment Apply 1 application topically 2 (two) times daily.    Marland Kitchen doxazosin (CARDURA) 4 MG tablet Take 4 mg by mouth daily.     Marland Kitchen EPINEPHrine (EPIPEN IJ) Inject as directed as needed (Anaphylaxis).     Marland Kitchen ezetimibe (ZETIA) 10 MG tablet TAKE ONE TABLET BY MOUTH ONCE DAILY 90 tablet 0  . fish oil-omega-3 fatty acids 1000 MG capsule Take 2 g by mouth 2 (two) times daily.     . hydrochlorothiazide (HYDRODIURIL) 25  MG tablet Take 25 mg by mouth daily.    Marland Kitchen ketoconazole (NIZORAL) 2 % cream Apply 1 application topically daily.    Marland Kitchen KLOR-CON M20 20 MEQ tablet TAKE ONE TABLET BY MOUTH ONCE DAILY 90 tablet 0  . lisinopril (PRINIVIL,ZESTRIL) 20 MG tablet TAKE 1/2 (ONE-HALF) TABLET BY MOUTH ONCE DAILY 90 tablet 3  . metoprolol tartrate (LOPRESSOR) 25 MG tablet TAKE 1/2 (ONE-HALF) TABLET BY MOUTH TWICE DAILY 90 tablet 3  . Multiple Vitamin (MULTIVITAMIN) capsule Take 1 capsule by mouth daily.    . nitroGLYCERIN (NITROSTAT) 0.4 MG SL tablet Place 1 tablet (0.4 mg total) under the tongue every 5 (five) minutes as needed for chest pain. 25 tablet 3  . omeprazole (PRILOSEC) 20 MG capsule Take 20 mg by mouth daily. Insurance will not cover the 40mg   capsule.     No current facility-administered medications on file prior to visit.     Allergies  Allergen Reactions  . Crestor [Rosuvastatin Calcium] Other (See Comments)    Severe myalgias on 5mg  twice weekly  . Erythromycin Base Anaphylaxis, Hives and Swelling    To any medication that has mycin ANY MED IN THE MYCIN FAMILY  . Other Anaphylaxis    ORGANIC VINEGAR - severe hypotension  . Ace Inhibitors   . Fenofibrate Other (See Comments)    Muscle aches   . Simcor [Niacin-Simvastatin Er]     Elevated CPK    Assessment/Plan:  1. Hyperlipidemia - LDL currently 138 on Zetia 10mg  daily and fish oil 4g daily above goal 70mg /dL due to hx of ASCVD. Pt is intolerant to Livalo, Crestor, Lipitor, and Simcor (myalgias, elevated CK). PCSK9i therapy is the only option to bring LDL to goal. Pt was previously approved for Praluent in 2016 however cost was prohibitive. We now have additional patient assistance opportunities and will see if we can get pt approved for free Praluent. Discussed expected benefits, side effects, and injection technique with Praluent therapy. Will submit prior authorization and follow up with pt via telephone.  Megan E. Supple, PharmD, CPP, [place] [address], [place], [place] [address] Phone: [phone]; Fax: [phone] 02/11/2017 8:28 AM

## 2017-02-11 NOTE — Telephone Encounter (Signed)
Received prior authorization approval for Praluent. Will send rx to [facility] to check on copay. If prohibitive, will submit patient assistance for Praluent. Noted that pt has spent >$300 out of pocket on meds and should be approved.

## 2017-02-11 NOTE — Patient Instructions (Signed)
It was nice to see you today.  We will start paperwork for Praluent injections.  Continue to take your Zetia and fish oil.  Please stop by your pharmacy and ask for a print out of your medication expense report. To be approved for the Praluent patient assistance, you and your wife need to have spent combined > $300 on medications this year.   Once you bring this form back to clinic, we can try to get you on Praluent at no cost

## 2017-02-14 DIAGNOSIS — L570 Actinic keratosis: Secondary | ICD-10-CM | POA: Diagnosis not present

## 2017-02-14 DIAGNOSIS — X32XXXD Exposure to sunlight, subsequent encounter: Secondary | ICD-10-CM | POA: Diagnosis not present

## 2017-02-14 DIAGNOSIS — L400 Psoriasis vulgaris: Secondary | ICD-10-CM | POA: Diagnosis not present

## 2017-02-16 DIAGNOSIS — H17822 Peripheral opacity of cornea, left eye: Secondary | ICD-10-CM | POA: Diagnosis not present

## 2017-02-16 DIAGNOSIS — H25013 Cortical age-related cataract, bilateral: Secondary | ICD-10-CM | POA: Diagnosis not present

## 2017-02-16 DIAGNOSIS — H02831 Dermatochalasis of right upper eyelid: Secondary | ICD-10-CM | POA: Diagnosis not present

## 2017-02-16 DIAGNOSIS — H04123 Dry eye syndrome of bilateral lacrimal glands: Secondary | ICD-10-CM | POA: Diagnosis not present

## 2017-02-16 DIAGNOSIS — H02834 Dermatochalasis of left upper eyelid: Secondary | ICD-10-CM | POA: Diagnosis not present

## 2017-02-16 NOTE — Telephone Encounter (Signed)
Praluent has been approved however copay is cost prohibitive at $374 per month. LMOM for pt x2 that he needs to bring in copy of out of pocket costs from pharmacy totaling > $300 so that we can submit paperwork through PASS program.

## 2017-02-23 DIAGNOSIS — Z23 Encounter for immunization: Secondary | ICD-10-CM | POA: Diagnosis not present

## 2017-03-01 ENCOUNTER — Other Ambulatory Visit: Payer: Self-pay | Admitting: Cardiology

## 2017-03-02 NOTE — Telephone Encounter (Signed)
PASS application submitted on 1/85, application still pending. Pt will need to provide proof of income. He is aware and will mail this to PASS program.

## 2017-03-07 ENCOUNTER — Other Ambulatory Visit: Payer: Self-pay | Admitting: Cardiology

## 2017-03-24 NOTE — Telephone Encounter (Signed)
Pt approved for Praluent through PASS program. He is aware to call clinic once he receives his first shipment.

## 2017-04-11 ENCOUNTER — Telehealth: Payer: Self-pay | Admitting: Pharmacist

## 2017-04-11 DIAGNOSIS — E782 Mixed hyperlipidemia: Secondary | ICD-10-CM

## 2017-04-11 NOTE — Telephone Encounter (Signed)
Pt presents today for Praluent injection training. Pt successfully self-administered Praluent into the abdomen with no adverse effects. Lab work scheduled in 8 weeks. Pt given patient assistance paperwork for 2019 year. He will return this to clinic once completed.

## 2017-04-20 ENCOUNTER — Other Ambulatory Visit: Payer: Self-pay | Admitting: Gastroenterology

## 2017-04-20 ENCOUNTER — Ambulatory Visit
Admission: RE | Admit: 2017-04-20 | Discharge: 2017-04-20 | Disposition: A | Discharge status: Home / Self Care | Payer: Medicare Other | Source: Ambulatory Visit | Attending: Gastroenterology | Admitting: Gastroenterology

## 2017-04-20 DIAGNOSIS — R140 Abdominal distension (gaseous): Secondary | ICD-10-CM

## 2017-04-20 DIAGNOSIS — R195 Other fecal abnormalities: Secondary | ICD-10-CM | POA: Diagnosis not present

## 2017-04-20 DIAGNOSIS — R197 Diarrhea, unspecified: Secondary | ICD-10-CM | POA: Diagnosis not present

## 2017-04-20 DIAGNOSIS — Z9049 Acquired absence of other specified parts of digestive tract: Secondary | ICD-10-CM | POA: Diagnosis not present

## 2017-04-20 DIAGNOSIS — K219 Gastro-esophageal reflux disease without esophagitis: Secondary | ICD-10-CM | POA: Diagnosis not present

## 2017-04-26 ENCOUNTER — Other Ambulatory Visit: Payer: Self-pay | Admitting: Gastroenterology

## 2017-04-26 DIAGNOSIS — R140 Abdominal distension (gaseous): Secondary | ICD-10-CM

## 2017-04-29 ENCOUNTER — Ambulatory Visit
Admission: RE | Admit: 2017-04-29 | Discharge: 2017-04-29 | Disposition: A | Discharge status: Home / Self Care | Payer: Medicare Other | Source: Ambulatory Visit | Attending: Gastroenterology | Admitting: Gastroenterology

## 2017-04-29 DIAGNOSIS — R140 Abdominal distension (gaseous): Secondary | ICD-10-CM | POA: Diagnosis not present

## 2017-05-20 DIAGNOSIS — R7989 Other specified abnormal findings of blood chemistry: Secondary | ICD-10-CM | POA: Diagnosis not present

## 2017-05-30 ENCOUNTER — Other Ambulatory Visit: Payer: Medicare Other | Admitting: *Deleted

## 2017-05-30 DIAGNOSIS — E782 Mixed hyperlipidemia: Secondary | ICD-10-CM

## 2017-05-30 LAB — LIPID PANEL
Chol/HDL Ratio: 3.8 ratio (ref 0.0–5.0)
Cholesterol, Total: 118 mg/dL (ref 100–199)
HDL: 31 mg/dL — ABNORMAL LOW (ref >39)
LDL Calculated: 66 mg/dL (ref 0–99)
Triglycerides: 107 mg/dL (ref 0–149)
VLDL Cholesterol Cal: 21 mg/dL (ref 5–40)

## 2017-05-30 LAB — HEPATIC FUNCTION PANEL
ALT: 64 IU/L — ABNORMAL HIGH (ref 0–44)
AST: 46 IU/L — ABNORMAL HIGH (ref 0–40)
Albumin: 4.2 g/dL (ref 3.5–4.8)
Alkaline Phosphatase: 83 IU/L (ref 39–117)
Bilirubin Total: 0.9 mg/dL (ref 0.0–1.2)
Bilirubin, Direct: 0.22 mg/dL (ref 0.00–0.40)
Total Protein: 6.5 g/dL (ref 6.0–8.5)

## 2017-05-31 ENCOUNTER — Telehealth: Payer: Self-pay | Admitting: Pharmacist

## 2017-05-31 DIAGNOSIS — E782 Mixed hyperlipidemia: Secondary | ICD-10-CM

## 2017-05-31 NOTE — Telephone Encounter (Signed)
Lipids excellent since starting Praluent. Pt is still waiting to hear on 2019 financial assistance through PASS program. Advised him to call them next week for an update. He is due for his next Praluent injection next week and will call if he needs a sample in the mean time.

## 2017-06-02 DIAGNOSIS — K219 Gastro-esophageal reflux disease without esophagitis: Secondary | ICD-10-CM | POA: Diagnosis not present

## 2017-06-02 DIAGNOSIS — K317 Polyp of stomach and duodenum: Secondary | ICD-10-CM | POA: Diagnosis not present

## 2017-06-02 DIAGNOSIS — K2930 Chronic superficial gastritis without bleeding: Secondary | ICD-10-CM | POA: Diagnosis not present

## 2017-06-02 DIAGNOSIS — K224 Dyskinesia of esophagus: Secondary | ICD-10-CM | POA: Diagnosis not present

## 2017-06-02 DIAGNOSIS — K2950 Unspecified chronic gastritis without bleeding: Secondary | ICD-10-CM | POA: Diagnosis not present

## 2017-06-02 DIAGNOSIS — K921 Melena: Secondary | ICD-10-CM | POA: Diagnosis not present

## 2017-06-06 NOTE — Telephone Encounter (Signed)
Pt was approved for PASS for 2019. He is aware to call them for refills.

## 2017-06-09 DIAGNOSIS — K2930 Chronic superficial gastritis without bleeding: Secondary | ICD-10-CM | POA: Diagnosis not present

## 2017-06-09 DIAGNOSIS — K317 Polyp of stomach and duodenum: Secondary | ICD-10-CM | POA: Diagnosis not present

## 2017-07-12 DIAGNOSIS — K219 Gastro-esophageal reflux disease without esophagitis: Secondary | ICD-10-CM | POA: Diagnosis not present

## 2017-07-12 DIAGNOSIS — R945 Abnormal results of liver function studies: Secondary | ICD-10-CM | POA: Diagnosis not present

## 2017-07-12 DIAGNOSIS — D509 Iron deficiency anemia, unspecified: Secondary | ICD-10-CM | POA: Diagnosis not present

## 2017-07-12 DIAGNOSIS — K3189 Other diseases of stomach and duodenum: Secondary | ICD-10-CM | POA: Diagnosis not present

## 2017-07-22 DIAGNOSIS — N402 Nodular prostate without lower urinary tract symptoms: Secondary | ICD-10-CM | POA: Diagnosis not present

## 2017-07-22 DIAGNOSIS — N401 Enlarged prostate with lower urinary tract symptoms: Secondary | ICD-10-CM | POA: Diagnosis not present

## 2017-08-04 DIAGNOSIS — R946 Abnormal results of thyroid function studies: Secondary | ICD-10-CM | POA: Diagnosis not present

## 2017-08-04 DIAGNOSIS — K760 Fatty (change of) liver, not elsewhere classified: Secondary | ICD-10-CM | POA: Diagnosis not present

## 2017-08-04 DIAGNOSIS — E785 Hyperlipidemia, unspecified: Secondary | ICD-10-CM | POA: Diagnosis not present

## 2017-08-04 DIAGNOSIS — I10 Essential (primary) hypertension: Secondary | ICD-10-CM | POA: Diagnosis not present

## 2017-08-04 DIAGNOSIS — N400 Enlarged prostate without lower urinary tract symptoms: Secondary | ICD-10-CM | POA: Diagnosis not present

## 2017-08-04 DIAGNOSIS — L409 Psoriasis, unspecified: Secondary | ICD-10-CM | POA: Diagnosis not present

## 2017-08-04 DIAGNOSIS — Z125 Encounter for screening for malignant neoplasm of prostate: Secondary | ICD-10-CM | POA: Diagnosis not present

## 2017-08-04 DIAGNOSIS — Z0000 Encounter for general adult medical examination without abnormal findings: Secondary | ICD-10-CM | POA: Diagnosis not present

## 2017-08-04 DIAGNOSIS — D649 Anemia, unspecified: Secondary | ICD-10-CM | POA: Diagnosis not present

## 2017-08-04 DIAGNOSIS — R7301 Impaired fasting glucose: Secondary | ICD-10-CM | POA: Diagnosis not present

## 2017-08-04 DIAGNOSIS — I25119 Atherosclerotic heart disease of native coronary artery with unspecified angina pectoris: Secondary | ICD-10-CM | POA: Diagnosis not present

## 2017-08-04 DIAGNOSIS — K22719 Barrett's esophagus with dysplasia, unspecified: Secondary | ICD-10-CM | POA: Diagnosis not present

## 2017-08-29 ENCOUNTER — Emergency Department (HOSPITAL_COMMUNITY): Payer: Medicare Other

## 2017-08-29 ENCOUNTER — Encounter (HOSPITAL_COMMUNITY): Payer: Self-pay | Admitting: Neurology

## 2017-08-29 ENCOUNTER — Other Ambulatory Visit: Payer: Self-pay

## 2017-08-29 ENCOUNTER — Emergency Department (HOSPITAL_COMMUNITY)
Admission: EM | Admit: 2017-08-29 | Discharge: 2017-08-29 | Disposition: A | Discharge status: Home / Self Care | Payer: Medicare Other | Attending: Emergency Medicine | Admitting: Emergency Medicine

## 2017-08-29 DIAGNOSIS — Z86010 Personal history of colonic polyps: Secondary | ICD-10-CM | POA: Diagnosis not present

## 2017-08-29 DIAGNOSIS — J690 Pneumonitis due to inhalation of food and vomit: Secondary | ICD-10-CM | POA: Diagnosis not present

## 2017-08-29 DIAGNOSIS — D126 Benign neoplasm of colon, unspecified: Secondary | ICD-10-CM | POA: Diagnosis not present

## 2017-08-29 DIAGNOSIS — I10 Essential (primary) hypertension: Secondary | ICD-10-CM | POA: Diagnosis not present

## 2017-08-29 DIAGNOSIS — Z79899 Other long term (current) drug therapy: Secondary | ICD-10-CM | POA: Diagnosis not present

## 2017-08-29 DIAGNOSIS — Z87891 Personal history of nicotine dependence: Secondary | ICD-10-CM | POA: Insufficient documentation

## 2017-08-29 DIAGNOSIS — Z955 Presence of coronary angioplasty implant and graft: Secondary | ICD-10-CM | POA: Insufficient documentation

## 2017-08-29 DIAGNOSIS — R109 Unspecified abdominal pain: Secondary | ICD-10-CM | POA: Diagnosis not present

## 2017-08-29 DIAGNOSIS — R079 Chest pain, unspecified: Secondary | ICD-10-CM | POA: Diagnosis present

## 2017-08-29 DIAGNOSIS — I2510 Atherosclerotic heart disease of native coronary artery without angina pectoris: Secondary | ICD-10-CM | POA: Diagnosis not present

## 2017-08-29 DIAGNOSIS — Z9889 Other specified postprocedural states: Secondary | ICD-10-CM | POA: Insufficient documentation

## 2017-08-29 DIAGNOSIS — R0602 Shortness of breath: Secondary | ICD-10-CM | POA: Diagnosis not present

## 2017-08-29 DIAGNOSIS — R0789 Other chest pain: Secondary | ICD-10-CM | POA: Diagnosis not present

## 2017-08-29 DIAGNOSIS — R195 Other fecal abnormalities: Secondary | ICD-10-CM | POA: Diagnosis not present

## 2017-08-29 DIAGNOSIS — D509 Iron deficiency anemia, unspecified: Secondary | ICD-10-CM | POA: Diagnosis not present

## 2017-08-29 LAB — CBC
HCT: 36.4 % — ABNORMAL LOW (ref 39.0–52.0)
Hemoglobin: 12.3 g/dL — ABNORMAL LOW (ref 13.0–17.0)
MCH: 27.5 pg (ref 26.0–34.0)
MCHC: 33.8 g/dL (ref 30.0–36.0)
MCV: 81.3 fL (ref 78.0–100.0)
Platelets: 152 10*3/uL (ref 150–400)
RBC: 4.48 MIL/uL (ref 4.22–5.81)
RDW: 13.8 % (ref 11.5–15.5)
WBC: 6.1 10*3/uL (ref 4.0–10.5)

## 2017-08-29 LAB — BASIC METABOLIC PANEL
Anion gap: 12 (ref 5–15)
BUN: <5 mg/dL — ABNORMAL LOW (ref 6–20)
CO2: 24 mmol/L (ref 22–32)
Calcium: 9 mg/dL (ref 8.9–10.3)
Chloride: 102 mmol/L (ref 101–111)
Creatinine, Ser: 0.79 mg/dL (ref 0.61–1.24)
GFR calc Af Amer: >60 mL/min (ref >60)
GFR calc non Af Amer: >60 mL/min (ref >60)
Glucose, Bld: 157 mg/dL — ABNORMAL HIGH (ref 65–99)
Potassium: 3.8 mmol/L (ref 3.5–5.1)
Sodium: 138 mmol/L (ref 135–145)

## 2017-08-29 LAB — I-STAT TROPONIN, ED
Troponin i, poc: 0 ng/mL (ref 0.00–0.08)
Troponin i, poc: 0 ng/mL (ref 0.00–0.08)

## 2017-08-29 MED ORDER — IOPAMIDOL (ISOVUE-370) INJECTION 76%
INTRAVENOUS | Status: AC
  Filled 2017-08-29: qty 100

## 2017-08-29 MED ORDER — AMOXICILLIN-POT CLAVULANATE 875-125 MG PO TABS
1.0000 | ORAL_TABLET | Freq: Once | ORAL | Status: AC
  Administered 2017-08-29: 1 via ORAL
  Filled 2017-08-29: qty 1

## 2017-08-29 MED ORDER — IOPAMIDOL (ISOVUE-370) INJECTION 76%
100.0000 mL | Freq: Once | INTRAVENOUS | Status: AC | PRN
  Administered 2017-08-29: 100 mL via INTRAVENOUS

## 2017-08-29 MED ORDER — AMOXICILLIN-POT CLAVULANATE 875-125 MG PO TABS: 1.0000 | ORAL_TABLET | Freq: Two times a day (BID) | ORAL | 0 refills | Status: DC

## 2017-08-29 MED ORDER — SODIUM CHLORIDE 0.9 % IV BOLUS
500.0000 mL | Freq: Once | INTRAVENOUS | Status: AC
  Administered 2017-08-29: 500 mL via INTRAVENOUS

## 2017-08-29 NOTE — ED Notes (Signed)
Patient transported to X-ray 

## 2017-08-29 NOTE — ED Triage Notes (Addendum)
Pt reports colonoscopy this morning, woke up in recovery area c/o CP. Feels tightness in chest, can't take a deep breath. Has cardiac stent, sees Dr. Radford Pax. Is a x 4.

## 2017-08-29 NOTE — ED Provider Notes (Signed)
[facility] EMERGENCY DEPARTMENT Provider Note   CSN: [id number] Arrival date & time: 08/29/17  1761     History   Chief Complaint Chief Complaint  Patient presents with  . Chest Pain    HPI Kenneth Poole is a 72 y.o. male.  Patient with history of coronary artery disease presents the emergency department today with tightness across the top of his chest with an sensation of a feeling difficult to take a deep breath in.  Symptoms started while patient was having a colonoscopy and was sedated.  He awoke with the symptoms.  He denies any acute chest pain or neck pain.  No diaphoresis or vomiting.  No fevers, abdominal pain or distention.  Reports several polyps removed during colonoscopy today.  Patient reports that he woke up during the procedure.  Patient has a history of GERD and aspiration in the past.  He denies any acute aspiration episodes today that he was told of during his sedation.  No recent cough or hemoptysis.  No lower extremity swelling.  The onset of this condition was acute. The course is constant. Aggravating factors: none. Alleviating factors: none.       Past Medical History:  Diagnosis Date  . Back pain   . Carotid artery occlusion    40-59% stenosis of RICA and 1-39%  left ICA stenosis  . Coronary artery disease    s.p PTCA of circumflex lesion in 2004, cardiologist Dr Radford Pax, nonobstructive CAD on CATH in June 2009 with 50% LAD  . Dermatitis   . Diverticulosis   . Dyslipidemia   . GERD (gastroesophageal reflux disease)   . Hypercholesteremia   . Hypercholesteremia   . Hypertension   . Obesity   . Peripheral neuropathy   . Psoriasis   . Urticaria     Patient Active Problem List   Diagnosis Date Noted  . Elevated liver enzymes 05/10/2014  . PVD (peripheral vascular disease) ([place]) 05/11/2013  . Carotid artery occlusion   . Hypertension   . Coronary artery disease   . Hypercholesteremia     Past Surgical History:    Procedure Laterality Date  . APPENDECTOMY    . BACK SURGERY    . CARDIAC CATHETERIZATION  June 2009  . CHOLECYSTECTOMY    . CORONARY STENT PLACEMENT    . KIDNEY STONE SURGERY    . TONSILLECTOMY          Home Medications    Prior to Admission medications   Medication Sig Start Date End Date Taking? Authorizing Provider  Alirocumab (PRALUENT) 75 MG/ML SOPN Inject 1 pen into the skin every 14 (fourteen) days. 02/11/17  Yes Turner, Eber Hong, MD  aluminum hydroxide-magnesium carbonate (GAVISCON) 95-358 MG/15ML SUSP Take 30 mLs by mouth as needed for indigestion or heartburn.   Yes [provider]  aspirin 81 MG tablet Take 81 mg by mouth daily.   Yes [provider]  augmented betamethasone dipropionate (DIPROLENE-AF) 0.05 % cream Apply 1 application topically 2 (two) times daily.   Yes [provider]  calcipotriene-betamethasone (TACLONEX) ointment Apply 1 application topically 2 (two) times daily.   Yes [provider]  calcium carbonate (TUMS - DOSED IN MG ELEMENTAL CALCIUM) 500 MG chewable tablet Chew 2 tablets by mouth as needed for indigestion or heartburn.   Yes [provider]  doxazosin (CARDURA) 4 MG tablet Take 4 mg by mouth daily.    Yes [provider]  EPINEPHrine (EPIPEN IJ) Inject as directed as  needed (Anaphylaxis).    Yes [provider]  ezetimibe (ZETIA) 10 MG tablet TAKE 1 TABLET BY MOUTH ONCE DAILY 03/07/17  Yes Turner, Eber Hong, MD  fish oil-omega-3 fatty acids 1000 MG capsule Take 2 g by mouth 2 (two) times daily.    Yes [provider]  hydrochlorothiazide (HYDRODIURIL) 25 MG tablet Take 25 mg by mouth daily.   Yes [provider]  ketoconazole (NIZORAL) 2 % cream Apply 1 application topically daily.   Yes [provider]  KLOR-CON M20 20 MEQ tablet TAKE 1 TABLET BY MOUTH ONCE DAILY 03/01/17  Yes Turner, Traci R, MD  lisinopril (PRINIVIL,ZESTRIL) 20 MG tablet TAKE 1/2 (ONE-HALF)  TABLET BY MOUTH ONCE DAILY 02/03/17  Yes Turner, Traci R, MD  metoprolol tartrate (LOPRESSOR) 25 MG tablet TAKE 1/2 (ONE-HALF) TABLET BY MOUTH TWICE DAILY 02/03/17  Yes Turner, Eber Hong, MD  Multiple Vitamin (MULTIVITAMIN) capsule Take 1 capsule by mouth daily.   Yes [provider]  nitroGLYCERIN (NITROSTAT) 0.4 MG SL tablet Place 1 tablet (0.4 mg total) under the tongue every 5 (five) minutes as needed for chest pain. 05/23/14  Yes Turner, Eber Hong, MD  omeprazole (PRILOSEC) 20 MG capsule Take 20 mg by mouth daily. Insurance will not cover the 40mg  capsule.   Yes [provider]  amoxicillin-clavulanate (AUGMENTIN) 875-125 MG tablet Take 1 tablet by mouth every 12 (twelve) hours. 08/29/17   Carlisle Cater, PA-C    Family History Family History  Problem Relation Age of Onset  . Alzheimer's disease Mother   . Heart attack Father     Social History Social History   Tobacco Use  . Smoking status: Former Smoker    Last attempt to quit: 05/24/1989    Years since quitting: 28.2  . Smokeless tobacco: Never Used  Substance Use Topics  . Alcohol use: No  . Drug use: No     Allergies   Apple cider vinegar; Crestor [rosuvastatin calcium]; Erythromycin base; Other; Ace inhibitors; Bee venom; Fenofibrate; and Simcor [niacin-simvastatin er]   Review of Systems Review of Systems  Constitutional: Negative for diaphoresis and fever.  Eyes: Negative for redness.  Respiratory: Positive for chest tightness. Negative for cough and shortness of breath.   Cardiovascular: Negative for chest pain, palpitations and leg swelling.  Gastrointestinal: Negative for abdominal pain, nausea and vomiting.  Genitourinary: Negative for dysuria.  Musculoskeletal: Negative for back pain and neck pain.  Skin: Negative for rash.  Neurological: Negative for syncope and light-headedness.  Psychiatric/Behavioral: The patient is not nervous/anxious.      Physical Exam Updated Vital Signs BP 127/76    Pulse 87   Temp 97.7 F (36.5 C) (Oral)   Resp 17   Ht 5\' 8"  (1.727 m)   Wt 90.7 kg (200 lb)   SpO2 96%   BMI 30.41 kg/m   Physical Exam  Constitutional: He appears well-developed and well-nourished.  HENT:  Head: Normocephalic and atraumatic.  Mouth/Throat: Mucous membranes are normal. Mucous membranes are not dry.  Eyes: Conjunctivae are normal.  Neck: Trachea normal and normal range of motion. Neck supple. Normal carotid pulses and no JVD present. No muscular tenderness present. Carotid bruit is not present. No tracheal deviation present.  Cardiovascular: Normal rate, regular rhythm, S1 normal, S2 normal, normal heart sounds and intact distal pulses. Exam reveals no distant heart sounds and no decreased pulses.  No murmur heard. Pulmonary/Chest: Effort normal and breath sounds normal. No respiratory distress. He has no decreased breath sounds. He  has no wheezes. He has no rhonchi. He has no rales. He exhibits no tenderness.  Abdominal: Soft. Normal aorta and bowel sounds are normal. There is no tenderness. There is no rebound and no guarding.  Musculoskeletal: He exhibits no edema.  Neurological: He is alert.  Skin: Skin is warm and dry. He is not diaphoretic. No cyanosis. No pallor.  Psychiatric: He has a normal mood and affect.  Nursing note and vitals reviewed.    ED Treatments / Results  Labs (all labs ordered are listed, but only abnormal results are displayed) Labs Reviewed  BASIC METABOLIC PANEL - Abnormal; Notable for the following components:      Result Value   Glucose, Bld 157 (*)    BUN <5 (*)    All other components within normal limits  CBC - Abnormal; Notable for the following components:   Hemoglobin 12.3 (*)    HCT 36.4 (*)    All other components within normal limits  I-STAT TROPONIN, ED  I-STAT TROPONIN, ED    ED ECG REPORT   Date: 08/29/2017  Rate: 85  Rhythm: normal sinus rhythm  QRS Axis: normal  Intervals: normal  ST/T Wave abnormalities:  normal  Conduction Disutrbances:none  Narrative Interpretation:   Old EKG Reviewed: changes noted, no ectopy compared to earlier today  I have personally reviewed the EKG tracing and agree with the computerized printout as noted.  Radiology Dg Chest 2 View  Result Date: 08/29/2017 CLINICAL DATA:  Chest pain post colonoscopy EXAM: CHEST - 2 VIEW COMPARISON:  02/01/2014 FINDINGS: Upper normal heart size. Mediastinal contours and pulmonary vascularity normal. LEFT basilar infiltrates which could represent pneumonia or aspiration. Remaining lungs clear. No pleural effusion or pneumothorax. Incidentally noted atherosclerotic calcification aorta. Bones demineralized. IMPRESSION: LEFT lower lobe infiltrate question pneumonia versus aspiration. Electronically Signed   By: Lavonia Dana M.D.   On: 08/29/2017 10:42   Ct Angio Chest Pe W And/or Wo Contrast  Result Date: 08/29/2017 CLINICAL DATA:  Chest pain and shortness of breath since colonoscopy this morning. EXAM: CT ANGIOGRAPHY CHEST WITH CONTRAST TECHNIQUE: Multidetector CT imaging of the chest was performed using the standard protocol during bolus administration of intravenous contrast. Multiplanar CT image reconstructions and MIPs were obtained to evaluate the vascular anatomy. CONTRAST:  124mL ISOVUE-370 IOPAMIDOL (ISOVUE-370) INJECTION 76% COMPARISON:  None. FINDINGS: Cardiovascular: The heart is normal in size. No pericardial effusion. The aorta is normal in caliber. No dissection. Scattered atherosclerotic calcifications. The branch vessels are patent. Three-vessel coronary artery calcifications are noted. The pulmonary arterial tree is fairly well opacified. No definite filling defects to suggest pulmonary embolism. Mediastinum/Nodes: No mediastinal or hilar mass or adenopathy. Small scattered lymph nodes are noted. The esophagus is grossly normal. There is a small hiatal hernia noted. Lungs/Pleura: Left upper lobe, lingular and left lower lobe  infiltrates most suggestive of aspiration. The right lung is clear. No pleural effusions. No worrisome pulmonary lesions. Upper Abdomen: No significant upper abdominal findings. Musculoskeletal: No chest wall mass, supraclavicular or axillary adenopathy. Thyroid goiter noted. No significant bony findings. Advanced degenerative changes involving the thoracic spine. Review of the MIP images confirms the above findings. IMPRESSION: 1. Left upper lobe, lingular and left lower lobe infiltrates most suggestive of aspiration. 2. No CT findings for pulmonary embolism. 3. Atherosclerotic calcifications involving the thoracic aorta and coronary arteries but no aneurysm or dissection. Aortic Atherosclerosis (ICD10-I70.0). Electronically Signed   By: Marijo Sanes M.D.   On: 08/29/2017 15:33   Dg Abd 2  Views  Result Date: 08/29/2017 CLINICAL DATA:  Post colonoscopy abdominal pain, initial encounter EXAM: ABDOMEN - 2 VIEW COMPARISON:  04/20/2017 FINDINGS: Scattered large and small bowel gas is noted. No obstructive changes are seen. No free air is noted. Degenerative changes of the lumbar spine are seen as well as postoperative change. IMPRESSION: No acute abnormality noted. Electronically Signed   By: Inez Catalina M.D.   On: 08/29/2017 14:51    Procedures Procedures (including critical care time)  Medications Ordered in ED Medications  iopamidol (ISOVUE-370) 76 % injection (has no administration in time range)  amoxicillin-clavulanate (AUGMENTIN) 875-125 MG per tablet 1 tablet (has no administration in time range)  sodium chloride 0.9 % bolus 500 mL (0 mLs Intravenous Stopped 08/29/17 1500)  iopamidol (ISOVUE-370) 76 % injection 100 mL (100 mLs Intravenous Contrast Given 08/29/17 1509)     Initial Impression / Assessment and Plan / ED Course  I have reviewed the triage vital signs and the nursing notes.  Pertinent labs & imaging results that were available during my care of the patient were reviewed by me and  considered in my medical decision making (see chart for details).     Patient seen and examined. Work-up reviewed.  Patient labs and exam are reassuring.  However patient does have questionable pneumonia versus aspiration on chest x-ray.  Will obtain CT imaging to rule out PE and better evaluate abnormality on x-ray.  Vital signs reviewed and are as follows: BP 127/76   Pulse 87   Temp 97.7 F (36.5 C) (Oral)   Resp 17   Ht 5\' 8"  (1.727 m)   Wt 90.7 kg (200 lb)   SpO2 96%   BMI 30.41 kg/m   CT reviewed by myself.  Radiologist reports likely aspiration in left upper and lower lobes.  Patient exam has remained stable without hypoxia, tachypnea.  He is in good spirits and is complaining of being hungry.  Discussed results with Dr. Venora Maples who has seen patient.  Will start on Augmentin.  Encourage PCP follow-up in 2-3 days for recheck.  Encouraged return to the emergency department with fever, difficulty breathing, increased shortness of breath, chest pain, new symptoms or other concerns.   Final Clinical Impressions(s) / ED Diagnoses   Final diagnoses:  S/P colonoscopy  Aspiration pneumonia of left upper lobe, unspecified aspiration pneumonia type [facility])   Patient presents with chest tightness, suspect aspiration pneumonia.  Normal vital signs.  Troponin negative and EKG nonischemic.  Do not suspect ACS.  No PE noted on CT.  No complications noted from colonoscopy today.  ED Discharge Orders        Ordered    amoxicillin-clavulanate (AUGMENTIN) 875-125 MG tablet  Every 12 hours     08/29/17 1547       Carlisle Cater, PA-C 08/29/17 1600    Jola Schmidt, MD 08/30/17 [phone]

## 2017-08-29 NOTE — ED Notes (Signed)
Patient transported to CT 

## 2017-08-29 NOTE — Discharge Instructions (Signed)
Please read and follow all provided instructions.  Your diagnoses today include:  1. Aspiration pneumonia of left upper lobe, unspecified aspiration pneumonia type ([place])   2. S/P colonoscopy     Tests performed today include:  Blood counts and electrolytes  Chest x-ray/Chest CT -- no blood clots, shows aspiration pneumonia  Vital signs. See below for your results today.   Medications prescribed:   Augmentin - antibiotic  You have been prescribed an antibiotic medicine: take the entire course of medicine even if you are feeling better. Stopping early can cause the antibiotic not to work.  Take any prescribed medications only as directed.  Home care instructions:  Follow any educational materials contained in this packet.  Take the complete course of antibiotics that you were prescribed.   BE VERY CAREFUL not to take multiple medicines containing Tylenol (also called acetaminophen). Doing so can lead to an overdose which can damage your liver and cause liver failure and possibly death.   Follow-up instructions: Please follow-up with your primary care provider in the next 3 days for further evaluation of your symptoms and to ensure resolution of your infection.   Return instructions:   Please return to the Emergency Department if you experience worsening symptoms.   Return immediately with worsening breathing, worsening shortness of breath, or if you feel it is taking you more effort to breathe.   Please return if you have any other emergent concerns.  Additional Information:  Your vital signs today were: BP 127/76    Pulse 87    Temp 97.7 F (36.5 C) (Oral)    Resp 17    Ht 5\' 8"  (1.727 m)    Wt 90.7 kg (200 lb)    SpO2 96%    BMI 30.41 kg/m  If your blood pressure (BP) was elevated above 135/85 this visit, please have this repeated by your doctor within one month. --------------

## 2017-08-31 DIAGNOSIS — J690 Pneumonitis due to inhalation of food and vomit: Secondary | ICD-10-CM | POA: Diagnosis not present

## 2017-08-31 DIAGNOSIS — D126 Benign neoplasm of colon, unspecified: Secondary | ICD-10-CM | POA: Diagnosis not present

## 2017-09-13 DIAGNOSIS — K219 Gastro-esophageal reflux disease without esophagitis: Secondary | ICD-10-CM | POA: Diagnosis not present

## 2017-09-13 DIAGNOSIS — Z86010 Personal history of colonic polyps: Secondary | ICD-10-CM | POA: Diagnosis not present

## 2017-09-13 DIAGNOSIS — K3189 Other diseases of stomach and duodenum: Secondary | ICD-10-CM | POA: Diagnosis not present

## 2017-09-13 DIAGNOSIS — D509 Iron deficiency anemia, unspecified: Secondary | ICD-10-CM | POA: Diagnosis not present

## 2017-09-13 DIAGNOSIS — R945 Abnormal results of liver function studies: Secondary | ICD-10-CM | POA: Diagnosis not present

## 2017-09-28 DIAGNOSIS — J690 Pneumonitis due to inhalation of food and vomit: Secondary | ICD-10-CM | POA: Diagnosis not present

## 2017-10-05 ENCOUNTER — Encounter: Payer: Self-pay | Admitting: Cardiology

## 2017-10-06 ENCOUNTER — Other Ambulatory Visit: Payer: Self-pay

## 2017-10-06 MED ORDER — NITROGLYCERIN 0.4 MG SL SUBL: 0.4000 mg | SUBLINGUAL_TABLET | SUBLINGUAL | 3 refills | Status: DC | PRN

## 2017-10-19 ENCOUNTER — Other Ambulatory Visit: Payer: Self-pay | Admitting: Cardiology

## 2017-10-19 ENCOUNTER — Other Ambulatory Visit (HOSPITAL_COMMUNITY): Payer: Self-pay | Admitting: Family Medicine

## 2017-10-19 DIAGNOSIS — I6523 Occlusion and stenosis of bilateral carotid arteries: Secondary | ICD-10-CM

## 2018-01-06 ENCOUNTER — Ambulatory Visit (HOSPITAL_COMMUNITY)
Admission: RE | Admit: 2018-01-06 | Discharge: 2018-01-06 | Disposition: A | Discharge status: Home / Self Care | Payer: Medicare Other | Source: Ambulatory Visit | Attending: Cardiology | Admitting: Cardiology

## 2018-01-06 ENCOUNTER — Other Ambulatory Visit: Payer: Self-pay | Admitting: Cardiology

## 2018-01-06 DIAGNOSIS — I6523 Occlusion and stenosis of bilateral carotid arteries: Secondary | ICD-10-CM | POA: Diagnosis not present

## 2018-01-06 NOTE — Telephone Encounter (Signed)
Outpatient Medication Detail    Disp Refills Start End   ezetimibe (ZETIA) 10 MG tablet 90 tablet 3 03/07/2017    Sig: TAKE 1 TABLET BY MOUTH ONCE DAILY   Sent to pharmacy as: ezetimibe (ZETIA) 10 MG tablet   E-Prescribing Status: Receipt confirmed by pharmacy (03/07/2017 3:36 PM EDT)   Pharmacy   [place], [place]

## 2018-01-09 ENCOUNTER — Inpatient Hospital Stay (HOSPITAL_COMMUNITY): Admission: RE | Admit: 2018-01-09 | Payer: Medicare Other | Source: Ambulatory Visit

## 2018-01-09 ENCOUNTER — Encounter: Payer: Self-pay | Admitting: Cardiology

## 2018-01-10 ENCOUNTER — Telehealth: Payer: Self-pay | Admitting: Cardiology

## 2018-01-10 DIAGNOSIS — I6523 Occlusion and stenosis of bilateral carotid arteries: Secondary | ICD-10-CM

## 2018-01-10 NOTE — Telephone Encounter (Signed)
Spoke with patient about his test results and he expressed understanding. An order for a repeat carotid doppler in 1 year was placed.   Notes recorded by Sueanne Margarita, MD on 01/09/2018 at 7:34 PM EDT Carotid dopplers showed 1-39% right and left ICA stenosis with < 50% plaque in right CCA - repeat dopplers in 1 year

## 2018-01-10 NOTE — Telephone Encounter (Signed)
New Message:   Pt returning a call for results

## 2018-01-19 DIAGNOSIS — J209 Acute bronchitis, unspecified: Secondary | ICD-10-CM | POA: Diagnosis not present

## 2018-01-19 NOTE — Progress Notes (Signed)
Cardiology Office Note:    Date:  01/20/2018   ID:  Kenneth Poole, DOB 08-Feb-1946, MRN [id number]  PCP:  Hulan Fess, MD  Cardiologist:  No primary care provider on file.    Referring MD: Hulan Fess, MD   Chief Complaint  Patient presents with  . Coronary Artery Disease  . Hypertension  . Hyperlipidemia    History of Present Illness:    Kenneth Poole is a 72 y.o. male with a hx of ASCAD s/p PTCA of circumflex lesion in 2004 and cath in June 2009 with 50% LAD.  He also has HTN and dyslipidemia.  Nuclear stress test for CP 01/2017 showed no ischemia.  He is here today for followup and is doing well.  He denies any chest pain or pressure, SOB, DOE, PND, orthopnea, LE edema, dizziness, palpitations or syncope. He is compliant with his meds and is tolerating meds with no SE.    Past Medical History:  Diagnosis Date  . Back pain   . Carotid artery disease (HCC)    1-39% bilateral carotid stenosis with with < 50% plaque in right CCA by dopplers 12/2017  . Coronary artery disease    s.p PTCA of circumflex lesion in 2004, cardiologist Dr Radford Pax, nonobstructive CAD on CATH in June 2009 with 50% LAD  . Dermatitis   . Diverticulosis   . Dyslipidemia   . GERD (gastroesophageal reflux disease)   . Hypercholesteremia   . Hypercholesteremia   . Hypertension   . Obesity   . Peripheral neuropathy   . Psoriasis   . Urticaria     Past Surgical History:  Procedure Laterality Date  . APPENDECTOMY    . BACK SURGERY    . CARDIAC CATHETERIZATION  June 2009  . CHOLECYSTECTOMY    . CORONARY STENT PLACEMENT    . KIDNEY STONE SURGERY    . TONSILLECTOMY      Current Medications: Current Meds  Medication Sig  . Alirocumab (PRALUENT) 75 MG/ML SOPN Inject 1 pen into the skin every 14 (fourteen) days.  Marland Kitchen aspirin 81 MG tablet Take 81 mg by mouth daily.  Marland Kitchen augmented betamethasone dipropionate (DIPROLENE-AF) 0.05 % cream Apply 1 application topically 2 (two) times daily.  .  calcipotriene-betamethasone (TACLONEX) ointment Apply 1 application topically 2 (two) times daily.  . calcium carbonate (TUMS - DOSED IN MG ELEMENTAL CALCIUM) 500 MG chewable tablet Chew 2 tablets by mouth as needed for indigestion or heartburn.  . doxazosin (CARDURA) 4 MG tablet Take 4 mg by mouth daily.   Marland Kitchen EPINEPHrine (EPIPEN IJ) Inject as directed as needed (Anaphylaxis).   Marland Kitchen ezetimibe (ZETIA) 10 MG tablet TAKE 1 TABLET BY MOUTH ONCE DAILY  . fish oil-omega-3 fatty acids 1000 MG capsule Take 2 g by mouth 2 (two) times daily.   . hydrochlorothiazide (HYDRODIURIL) 25 MG tablet Take 25 mg by mouth daily.  Marland Kitchen ketoconazole (NIZORAL) 2 % cream Apply 1 application topically daily.  Marland Kitchen KLOR-CON M20 20 MEQ tablet TAKE 1 TABLET BY MOUTH ONCE DAILY  . lisinopril (PRINIVIL,ZESTRIL) 20 MG tablet TAKE 1/2 (ONE-HALF) TABLET BY MOUTH ONCE DAILY  . metoprolol tartrate (LOPRESSOR) 25 MG tablet TAKE 1/2 (ONE-HALF) TABLET BY MOUTH TWICE DAILY  . Multiple Vitamin (MULTIVITAMIN) capsule Take 1 capsule by mouth daily.  . nitroGLYCERIN (NITROSTAT) 0.4 MG SL tablet Place 1 tablet (0.4 mg total) under the tongue every 5 (five) minutes as needed for chest pain.  Marland Kitchen omeprazole (PRILOSEC) 20 MG capsule Take 20 mg by mouth  2 (two) times daily. Insurance will not cover the 40mg  capsule. 40 mg bid     Allergies:   Apple cider vinegar; Crestor [rosuvastatin calcium]; Erythromycin base; Other; Ace inhibitors; Bee venom; Fenofibrate; and Simcor [niacin-simvastatin er]   Social History   Socioeconomic History  . Marital status: Married    Spouse name: Not on file  . Number of children: Not on file  . Years of education: Not on file  . Highest education level: Not on file  Occupational History  . Not on file  Social Needs  . Financial resource strain: Not on file  . Food insecurity:    Worry: Not on file    Inability: Not on file  . Transportation needs:    Medical: Not on file    Non-medical: Not on file  Tobacco  Use  . Smoking status: Former Smoker    Last attempt to quit: 05/24/1989    Years since quitting: 28.6  . Smokeless tobacco: Never Used  Substance and Sexual Activity  . Alcohol use: No  . Drug use: No  . Sexual activity: Not on file  Lifestyle  . Physical activity:    Days per week: Not on file    Minutes per session: Not on file  . Stress: Not on file  Relationships  . Social connections:    Talks on phone: Not on file    Gets together: Not on file    Attends religious service: Not on file    Active member of club or organization: Not on file    Attends meetings of clubs or organizations: Not on file    Relationship status: Not on file  Other Topics Concern  . Not on file  Social History Narrative  . Not on file     Family History: The patient's family history includes Alzheimer's disease in his mother; Heart attack in his father.  ROS:   Please see the history of present illness.    ROS  All other systems reviewed and negative.   EKGs/Labs/Other Studies Reviewed:    The following studies were reviewed today: none  EKG:  EKG is not ordered today.    Recent Labs: 05/30/2017: ALT 64 08/29/2017: BUN <5; Creatinine, Ser 0.79; Hemoglobin 12.3; Platelets 152; Potassium 3.8; Sodium 138   Recent Lipid Panel    Component Value Date/Time   CHOL 118 05/30/2017 0808   TRIG 107 05/30/2017 0808   HDL 31 (L) 05/30/2017 0808   CHOLHDL 3.8 05/30/2017 0808   CHOLHDL 5.9 (H) 11/27/2015 0801   VLDL 38 (H) 11/27/2015 0801   LDLCALC 66 05/30/2017 0808   LDLDIRECT 146 (H) 04/09/2015 0802    Physical Exam:    VS:  BP 116/64   Pulse 76   Ht 5\' 8"  (1.727 m)   Wt 203 lb 1.9 oz (92.1 kg)   SpO2 96%   BMI 30.88 kg/m     Wt Readings from Last 3 Encounters:  01/20/18 203 lb 1.9 oz (92.1 kg)  08/29/17 200 lb (90.7 kg)  01/25/17 200 lb (90.7 kg)     GEN: Well nourished, well developed in no acute distress HEENT: Normal NECK: No JVD; No carotid bruits LYMPHATICS: No  lymphadenopathy CARDIAC: RRR, no murmurs, rubs, gallops RESPIRATORY:  Clear to auscultation without rales, wheezing or rhonchi  ABDOMEN: Soft, non-tender, non-distended MUSCULOSKELETAL:  No edema; No deformity  SKIN: Warm and dry NEUROLOGIC:  Alert and oriented x 3 PSYCHIATRIC:  Normal affect   ASSESSMENT:  1. Coronary artery disease involving native coronary artery of native heart without angina pectoris   2. Essential hypertension   3. Bilateral carotid artery stenosis   4. Hypercholesteremia    PLAN:    In order of problems listed above:  1.  ASCAD - ASCAD s/p PTCA of circumflex lesion in 2004 and cath in June 2009 with 50% LAD.  Nuclear stress test 01/2017 showed no ischemia.  He has not had any further anginal CP since I saw him last.  He will continue on ASA 81mg  daily, BB and Praluent.  He is statin intolerant    2.  HTN - BP is controlled on exam today.  He will continue on doxazosin 4mg  daily, HCTZ 25mg  daily, Lisinopril 10mg  daily and lopressor 12.5mg  BID.  Creatinine was normal at 0.790 and K+ 3.8 on 08/29/2017.  3.  Bilateral carotid artery stenosis - recent dopplers showed 1-39% right and left ICA stenosis with < 50% plaque in right CCA.  He will continue on ASA and Praluent.  Repeat dopplers 12/2018.  4.  Hyperlipidemia with LDL goal < 70.  LDL was 66 on 05/30/2017.  He will continue on Zetia 10mg  daily and Praluent.    Medication Adjustments/Labs and Tests Ordered: Current medicines are reviewed at length with the patient today.  Concerns regarding medicines are outlined above.  No orders of the defined types were placed in this encounter.  No orders of the defined types were placed in this encounter.   Signed, Fransico Him, MD  01/20/2018 7:58 AM    [place]

## 2018-01-20 ENCOUNTER — Ambulatory Visit (INDEPENDENT_AMBULATORY_CARE_PROVIDER_SITE_OTHER): Payer: Medicare Other | Admitting: Cardiology

## 2018-01-20 ENCOUNTER — Encounter: Payer: Self-pay | Admitting: Cardiology

## 2018-01-20 VITALS — BP 116/64 | HR 76 | Ht 68.0 in | Wt 203.1 lb

## 2018-01-20 DIAGNOSIS — I6523 Occlusion and stenosis of bilateral carotid arteries: Secondary | ICD-10-CM | POA: Diagnosis not present

## 2018-01-20 DIAGNOSIS — I2510 Atherosclerotic heart disease of native coronary artery without angina pectoris: Secondary | ICD-10-CM

## 2018-01-20 DIAGNOSIS — E7800 Pure hypercholesterolemia, unspecified: Secondary | ICD-10-CM | POA: Diagnosis not present

## 2018-01-20 DIAGNOSIS — I10 Essential (primary) hypertension: Secondary | ICD-10-CM

## 2018-01-20 NOTE — Patient Instructions (Signed)
Your physician recommends that you continue on your current medications as directed. Please refer to the Current Medication list given to you today.   Your physician wants you to follow-up in: YEAR WITH DR TURNER You will receive a reminder letter in the mail two months in advance. If you don't receive a letter, please call our office to schedule the follow-up appointment.  

## 2018-01-25 ENCOUNTER — Other Ambulatory Visit: Payer: Self-pay | Admitting: Cardiology

## 2018-02-22 ENCOUNTER — Other Ambulatory Visit: Payer: Self-pay | Admitting: Cardiology

## 2018-02-27 DIAGNOSIS — Z23 Encounter for immunization: Secondary | ICD-10-CM | POA: Diagnosis not present

## 2018-04-03 NOTE — Addendum Note (Signed)
Addended by: SUPPLE, MEGAN E on: 04/03/2018 08:07 AM   Modules accepted: Orders

## 2018-04-04 ENCOUNTER — Other Ambulatory Visit: Payer: Medicare Other

## 2018-04-04 DIAGNOSIS — E782 Mixed hyperlipidemia: Secondary | ICD-10-CM | POA: Diagnosis not present

## 2018-04-04 LAB — HEPATIC FUNCTION PANEL
ALT: 64 IU/L — ABNORMAL HIGH (ref 0–44)
AST: 54 IU/L — ABNORMAL HIGH (ref 0–40)
Albumin: 4.4 g/dL (ref 3.5–4.8)
Alkaline Phosphatase: 76 IU/L (ref 39–117)
Bilirubin Total: 1.1 mg/dL (ref 0.0–1.2)
Bilirubin, Direct: 0.23 mg/dL (ref 0.00–0.40)
Total Protein: 6.7 g/dL (ref 6.0–8.5)

## 2018-04-04 LAB — LIPID PANEL
Chol/HDL Ratio: 2.7 ratio (ref 0.0–5.0)
Cholesterol, Total: 114 mg/dL (ref 100–199)
HDL: 42 mg/dL (ref >39)
LDL Calculated: 41 mg/dL (ref 0–99)
Triglycerides: 155 mg/dL — ABNORMAL HIGH (ref 0–149)
VLDL Cholesterol Cal: 31 mg/dL (ref 5–40)

## 2018-04-17 IMAGING — CR DG ABDOMEN 2V
2 series · 2 of 2 positions shown · non-contrast
Comparison: 04/20/2017

CLINICAL DATA: Post colonoscopy abdominal pain, initial encounter

EXAM:
ABDOMEN - 2 VIEW

[abdomen erect]
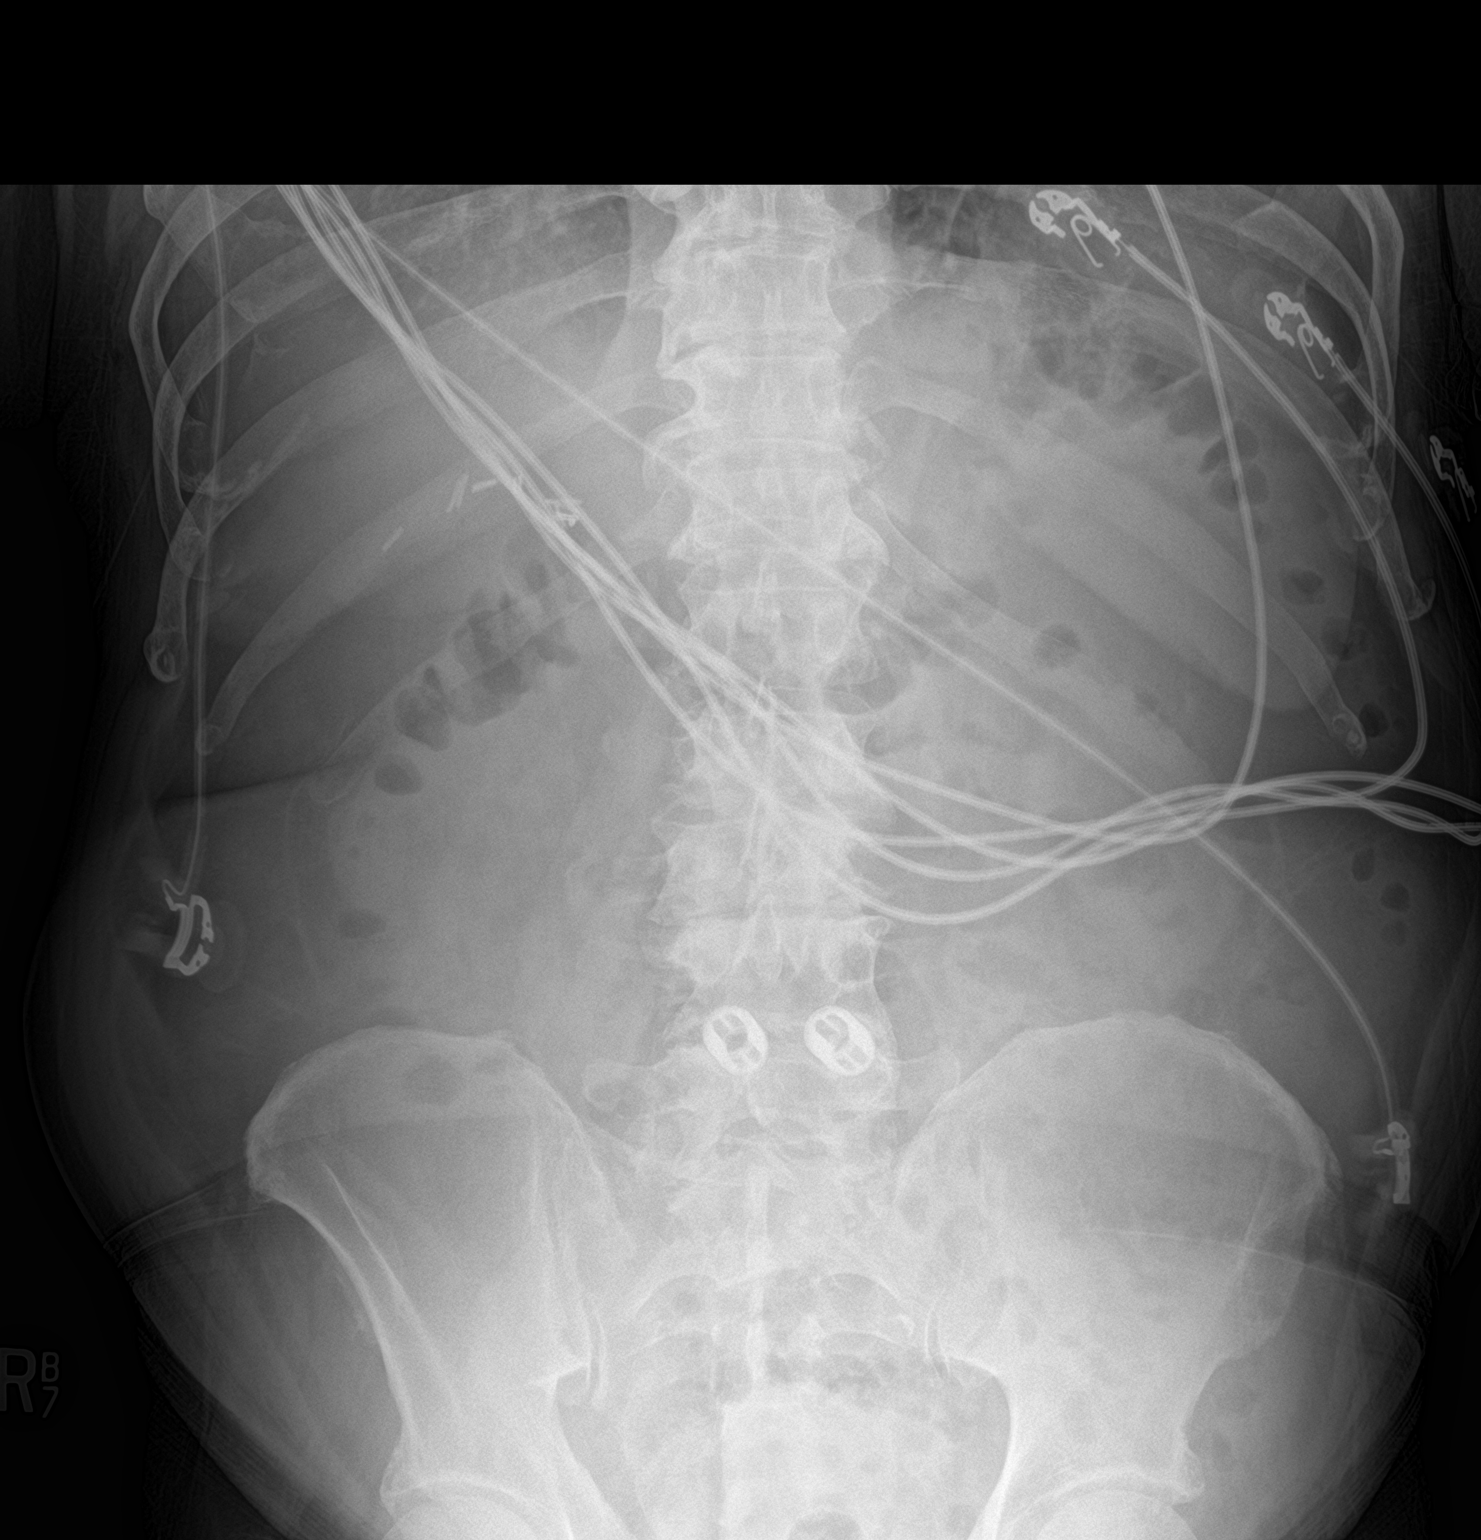

[abdomen supine]
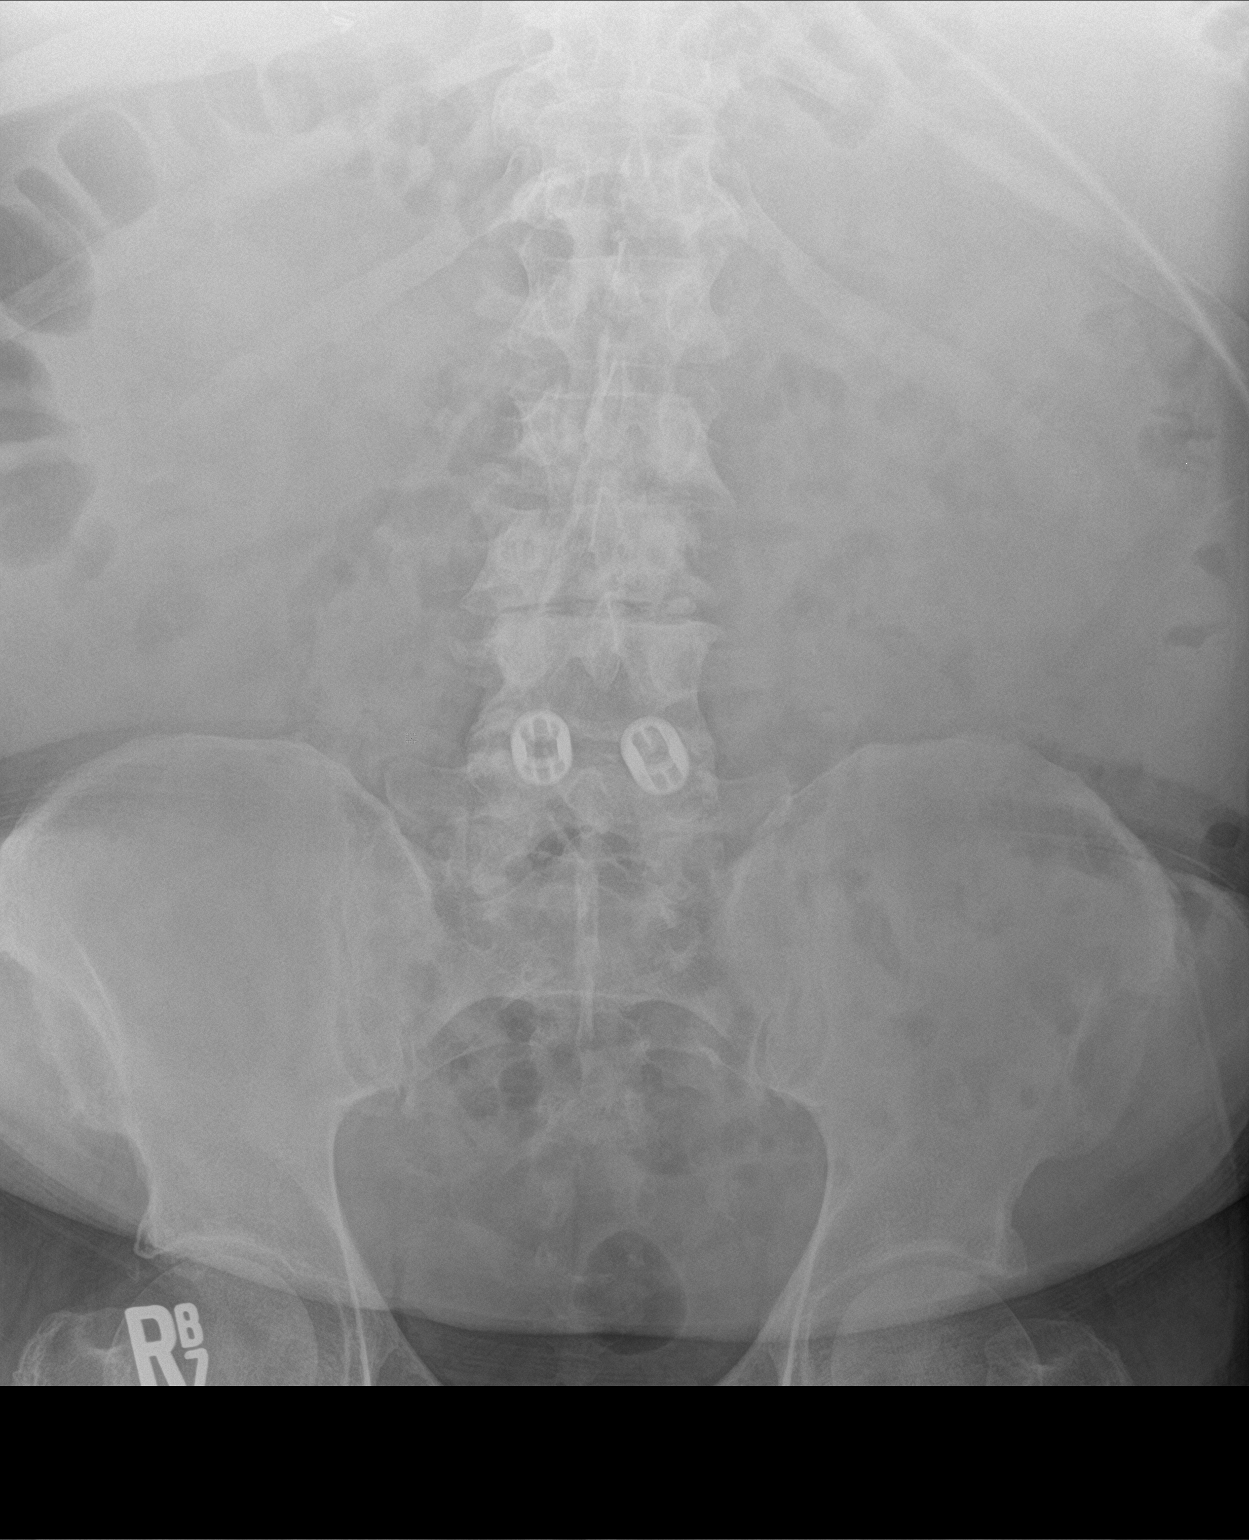

[2 of 2 positions shown; findings below may reference images not displayed]

FINDINGS: Scattered large and small bowel gas is noted. No obstructive changes
are seen. No free air is noted. Degenerative changes of the lumbar
spine are seen as well as postoperative change.
IMPRESSION: No acute abnormality noted.

## 2018-04-17 IMAGING — CT CT ANGIO CHEST
2 of 6 series · 18 of 36 positions shown · IV contrast (Omni 300)
Comparison: None.

CLINICAL DATA: Chest pain and shortness of breath since colonoscopy
this morning.

EXAM:
CT ANGIOGRAPHY CHEST WITH CONTRAST
TECHNIQUE: Multidetector CT imaging of the chest was performed using the
standard protocol during bolus administration of intravenous
contrast. Multiplanar CT image reconstructions and MIPs were
obtained to evaluate the vascular anatomy.
CONTRAST:  100mL [id number] IOPAMIDOL ([id number]) INJECTION 76%

[Series 7: pe thins · axial · 0.75mm/px · z∈[+1107,+1321]mm · 17 of 242 slices shown]
[im 14/242  lung]
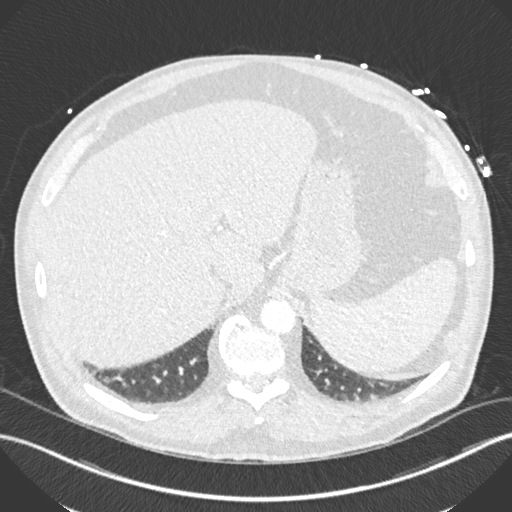
[im 27/242  mediastinal]
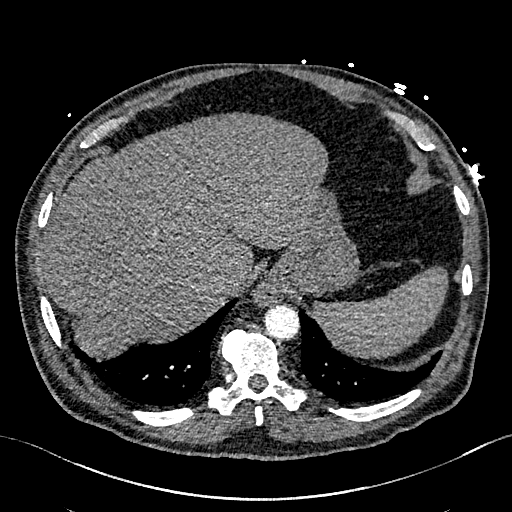
[im 41/242  lung]
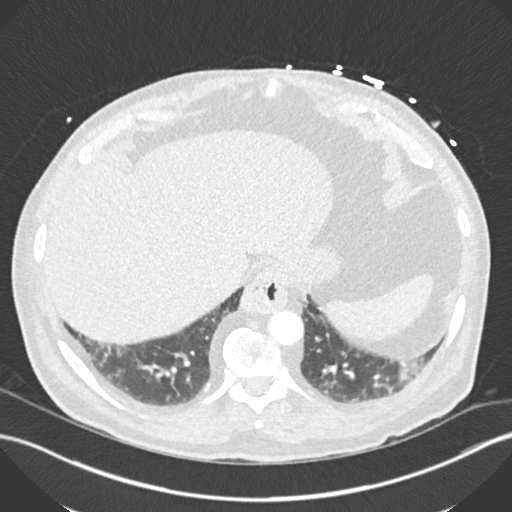
[im 54/242  mediastinal]
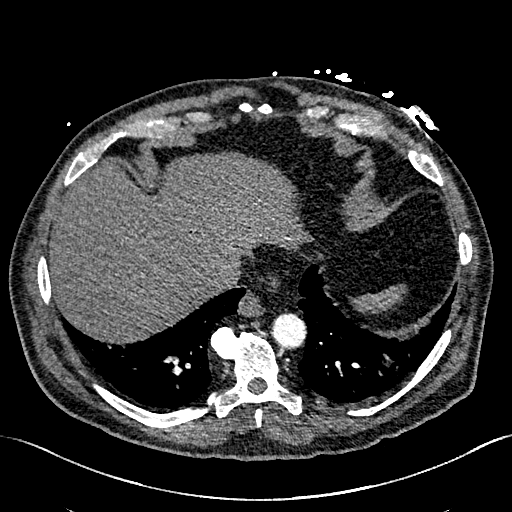
[im 67/242  lung]
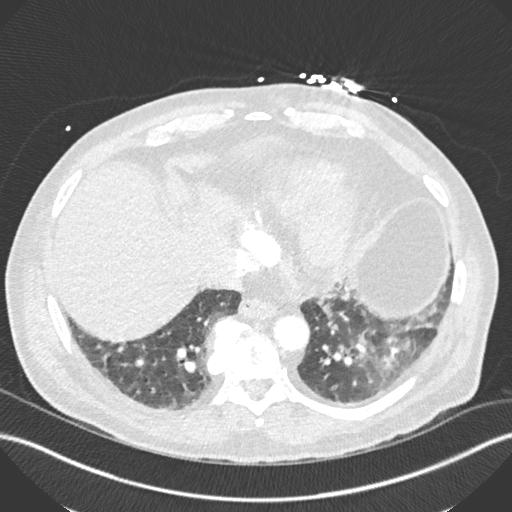
[im 81/242  mediastinal]
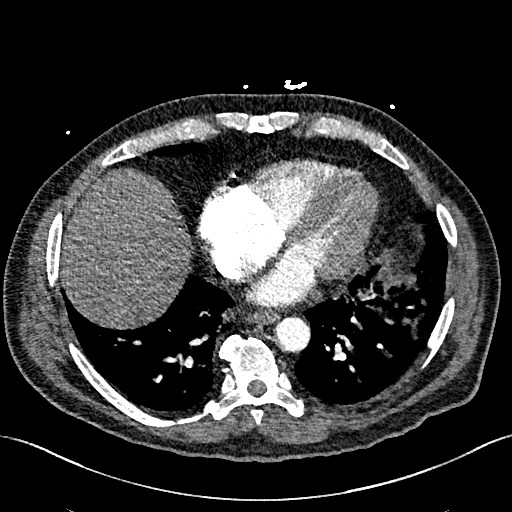
[im 94/242  lung]
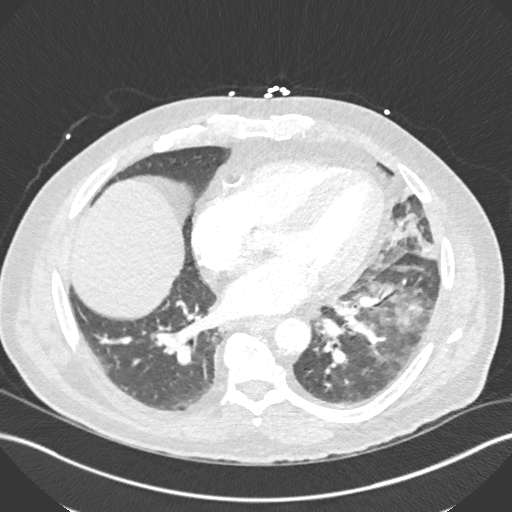
[im 108/242  mediastinal]
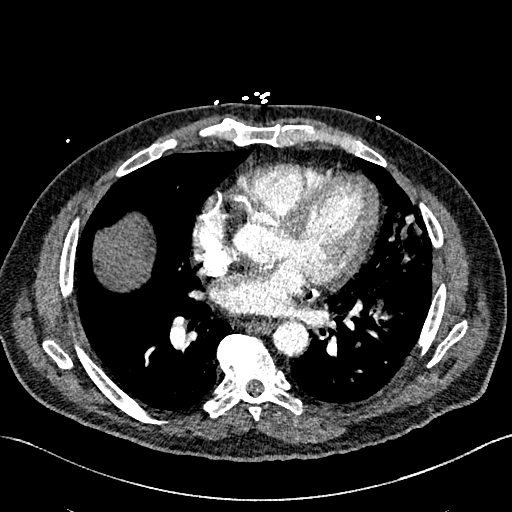
[im 121/242  lung]
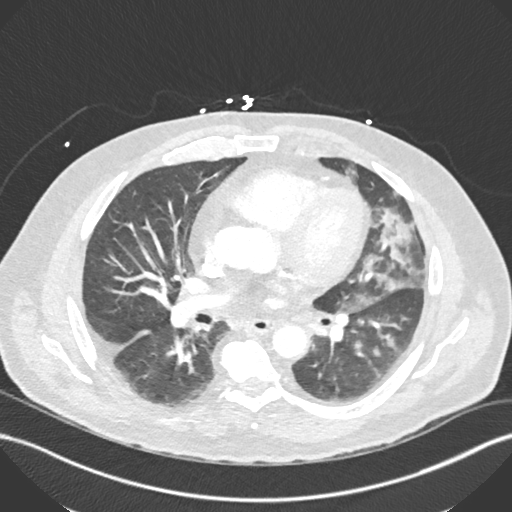
[im 134/242  mediastinal]
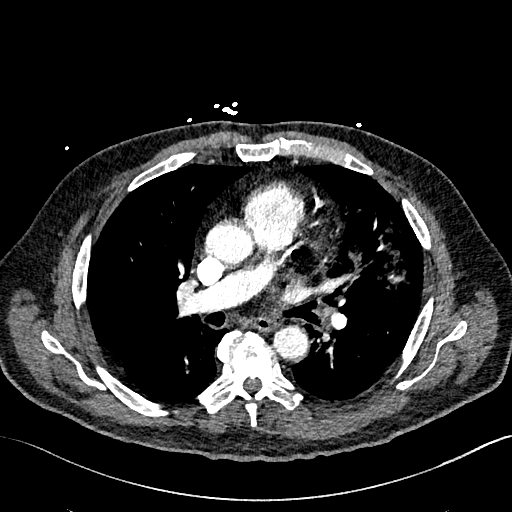
[im 148/242  lung]
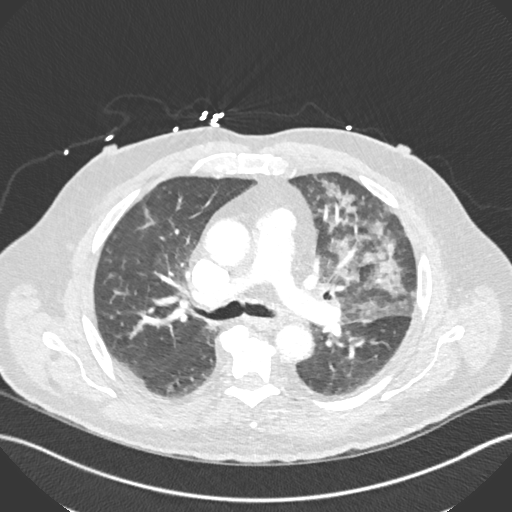
[im 161/242  mediastinal]
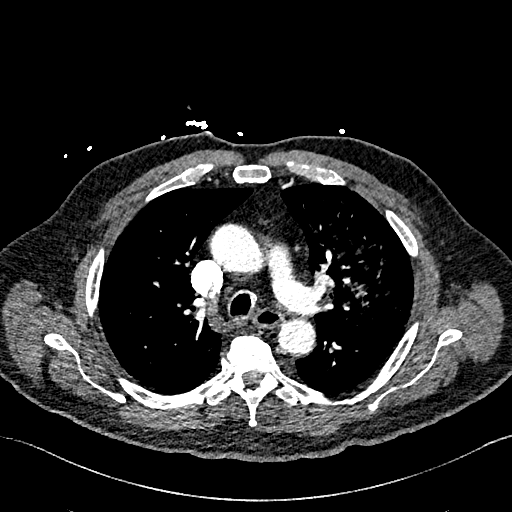
[im 175/242  lung]
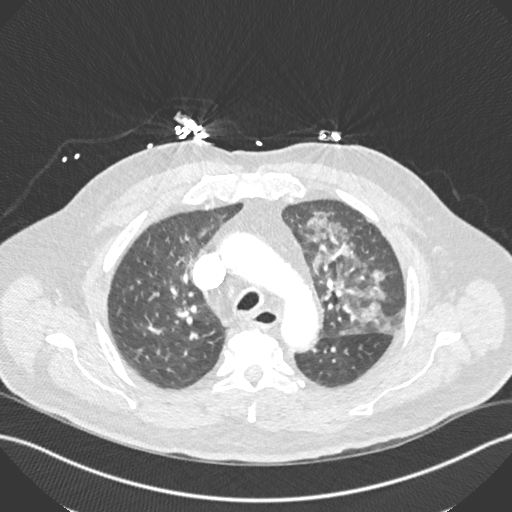
[im 188/242  mediastinal]
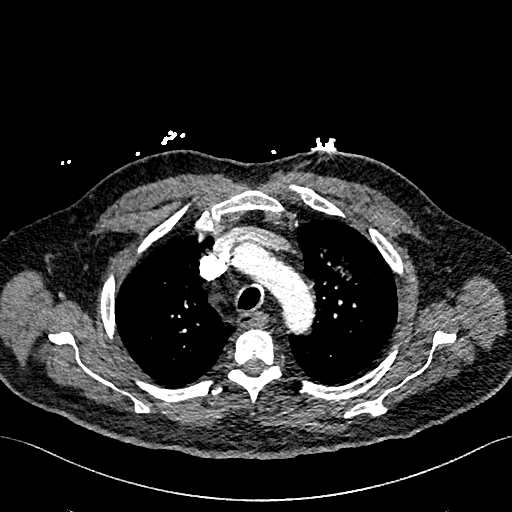
[im 201/242  lung]
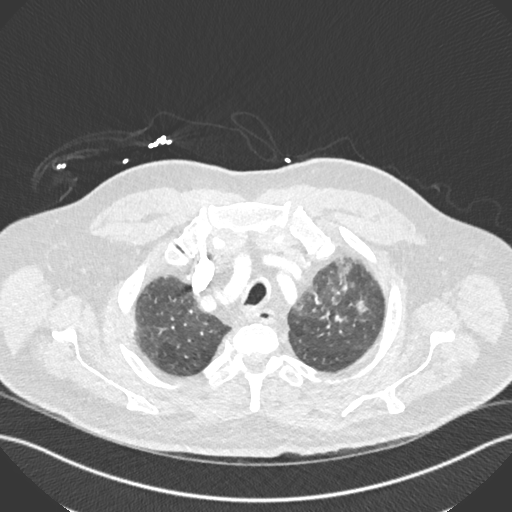
[im 215/242  mediastinal]
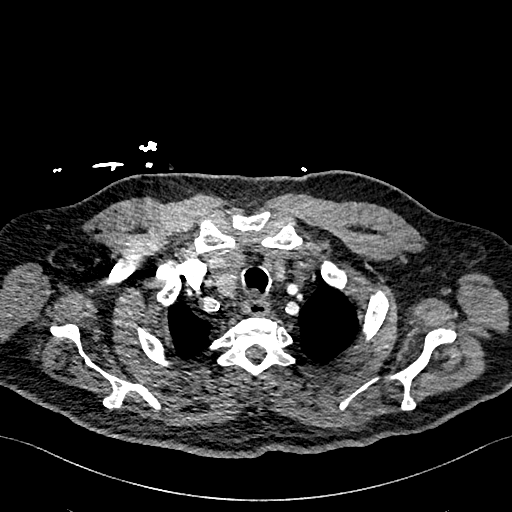
[im 228/242  lung]
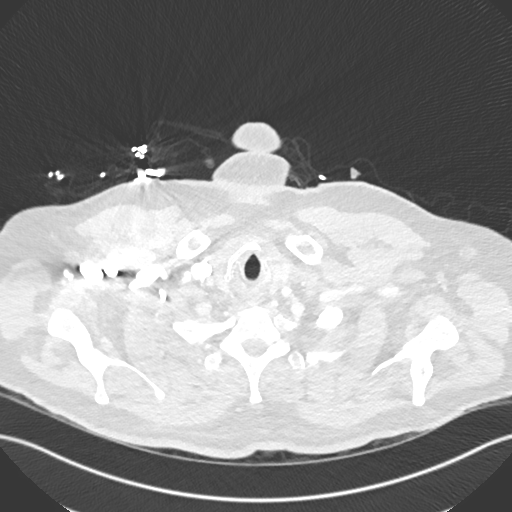

[Series 8: pe 2mm cor · coronal · 0.51mm/px · 1 of 151 slices shown]
[im 76/151  mediastinal]
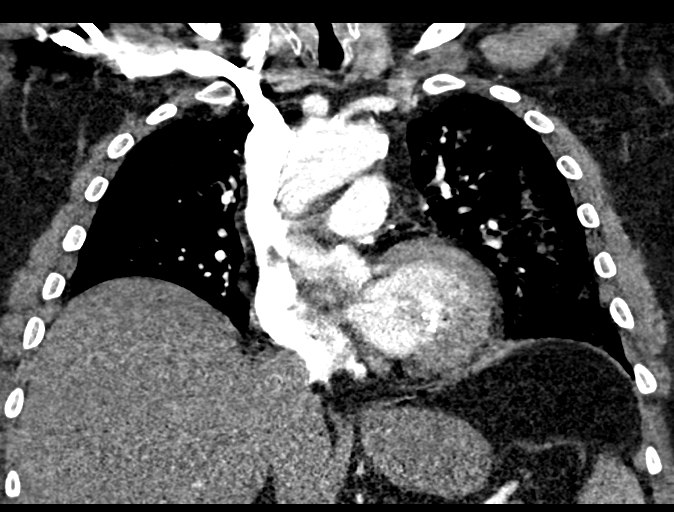

[18 of 36 positions shown; findings below may reference images not displayed]

FINDINGS: Cardiovascular: The heart is normal in size. No pericardial
effusion. The aorta is normal in caliber. No dissection. Scattered
atherosclerotic calcifications. The branch vessels are patent.
Three-vessel coronary artery calcifications are noted.

The pulmonary arterial tree is fairly well opacified. No definite
filling defects to suggest pulmonary embolism.

Mediastinum/Nodes: No mediastinal or hilar mass or adenopathy. Small
scattered lymph nodes are noted. The esophagus is grossly normal.
There is a small hiatal hernia noted.

Lungs/Pleura: Left upper lobe, lingular and left lower lobe
infiltrates most suggestive of aspiration. The right lung is clear.
No pleural effusions. No worrisome pulmonary lesions.

Upper Abdomen: No significant upper abdominal findings.

Musculoskeletal: No chest wall mass, supraclavicular or axillary
adenopathy. Thyroid goiter noted.

No significant bony findings. Advanced degenerative changes
involving the thoracic spine.

Review of the MIP images confirms the above findings.
IMPRESSION: 1. Left upper lobe, lingular and left lower lobe infiltrates most
suggestive of aspiration.
2. No CT findings for pulmonary embolism.
3. Atherosclerotic calcifications involving the thoracic aorta and
coronary arteries but no aneurysm or dissection.

Aortic Atherosclerosis ([id number]).

## 2018-04-24 ENCOUNTER — Other Ambulatory Visit: Payer: Self-pay | Admitting: Cardiology

## 2018-04-27 DIAGNOSIS — Z79899 Other long term (current) drug therapy: Secondary | ICD-10-CM | POA: Diagnosis not present

## 2018-04-27 DIAGNOSIS — R945 Abnormal results of liver function studies: Secondary | ICD-10-CM | POA: Diagnosis not present

## 2018-04-27 DIAGNOSIS — K219 Gastro-esophageal reflux disease without esophagitis: Secondary | ICD-10-CM | POA: Diagnosis not present

## 2018-04-27 DIAGNOSIS — K3189 Other diseases of stomach and duodenum: Secondary | ICD-10-CM | POA: Diagnosis not present

## 2018-04-27 DIAGNOSIS — D509 Iron deficiency anemia, unspecified: Secondary | ICD-10-CM | POA: Diagnosis not present

## 2018-04-27 DIAGNOSIS — Z86010 Personal history of colonic polyps: Secondary | ICD-10-CM | POA: Diagnosis not present

## 2018-05-01 DIAGNOSIS — J0110 Acute frontal sinusitis, unspecified: Secondary | ICD-10-CM | POA: Diagnosis not present

## 2018-05-08 ENCOUNTER — Telehealth: Payer: Self-pay | Admitting: Pharmacist

## 2018-05-08 NOTE — Telephone Encounter (Signed)
Pt has been on Praluent since Sept 2018. Prior Josem Kaufmann was submitted for renewal, however insurance now only covers [place] for 2020. Will submit a new PA, pt is aware of medication change.

## 2018-05-09 DIAGNOSIS — D509 Iron deficiency anemia, unspecified: Secondary | ICD-10-CM | POA: Diagnosis not present

## 2018-06-13 ENCOUNTER — Telehealth: Payer: Self-pay | Admitting: Pharmacist

## 2018-06-13 MED ORDER — EVOLOCUMAB 140 MG/ML ~~LOC~~ SOAJ: 1.0000 "pen " | SUBCUTANEOUS | 11 refills | Status: DC

## 2018-06-13 NOTE — Telephone Encounter (Signed)
Spoke with pt - Repatha not Praluent is covered on his 2020 formulary. Copay is affordable at $30 per month. Pt aware to pick up rx for Repatha after he uses up his last few Praluent pens.

## 2018-06-26 DIAGNOSIS — D509 Iron deficiency anemia, unspecified: Secondary | ICD-10-CM | POA: Diagnosis not present

## 2018-07-18 DIAGNOSIS — L570 Actinic keratosis: Secondary | ICD-10-CM | POA: Diagnosis not present

## 2018-07-18 DIAGNOSIS — L820 Inflamed seborrheic keratosis: Secondary | ICD-10-CM | POA: Diagnosis not present

## 2018-07-18 DIAGNOSIS — L400 Psoriasis vulgaris: Secondary | ICD-10-CM | POA: Diagnosis not present

## 2018-07-18 DIAGNOSIS — L821 Other seborrheic keratosis: Secondary | ICD-10-CM | POA: Diagnosis not present

## 2018-07-18 DIAGNOSIS — D485 Neoplasm of uncertain behavior of skin: Secondary | ICD-10-CM | POA: Diagnosis not present

## 2018-07-24 DIAGNOSIS — L400 Psoriasis vulgaris: Secondary | ICD-10-CM | POA: Diagnosis not present

## 2018-07-25 DIAGNOSIS — N401 Enlarged prostate with lower urinary tract symptoms: Secondary | ICD-10-CM | POA: Diagnosis not present

## 2018-07-25 DIAGNOSIS — N402 Nodular prostate without lower urinary tract symptoms: Secondary | ICD-10-CM | POA: Diagnosis not present

## 2018-07-26 DIAGNOSIS — L400 Psoriasis vulgaris: Secondary | ICD-10-CM | POA: Diagnosis not present

## 2018-07-31 DIAGNOSIS — L400 Psoriasis vulgaris: Secondary | ICD-10-CM | POA: Diagnosis not present

## 2018-08-02 DIAGNOSIS — L400 Psoriasis vulgaris: Secondary | ICD-10-CM | POA: Diagnosis not present

## 2018-08-04 DIAGNOSIS — L400 Psoriasis vulgaris: Secondary | ICD-10-CM | POA: Diagnosis not present

## 2018-08-07 DIAGNOSIS — L400 Psoriasis vulgaris: Secondary | ICD-10-CM | POA: Diagnosis not present

## 2018-08-09 DIAGNOSIS — L400 Psoriasis vulgaris: Secondary | ICD-10-CM | POA: Diagnosis not present

## 2018-10-18 ENCOUNTER — Other Ambulatory Visit: Payer: Self-pay | Admitting: Cardiology

## 2019-01-03 DIAGNOSIS — H02834 Dermatochalasis of left upper eyelid: Secondary | ICD-10-CM | POA: Diagnosis not present

## 2019-01-03 DIAGNOSIS — H02831 Dermatochalasis of right upper eyelid: Secondary | ICD-10-CM | POA: Diagnosis not present

## 2019-01-03 DIAGNOSIS — H04123 Dry eye syndrome of bilateral lacrimal glands: Secondary | ICD-10-CM | POA: Diagnosis not present

## 2019-01-03 DIAGNOSIS — H17822 Peripheral opacity of cornea, left eye: Secondary | ICD-10-CM | POA: Diagnosis not present

## 2019-01-03 DIAGNOSIS — H25813 Combined forms of age-related cataract, bilateral: Secondary | ICD-10-CM | POA: Diagnosis not present

## 2019-01-11 ENCOUNTER — Encounter: Payer: Self-pay | Admitting: Cardiology

## 2019-01-11 ENCOUNTER — Other Ambulatory Visit: Payer: Self-pay

## 2019-01-11 ENCOUNTER — Ambulatory Visit (HOSPITAL_COMMUNITY)
Admission: RE | Admit: 2019-01-11 | Discharge: 2019-01-11 | Disposition: A | Discharge status: Home / Self Care | Payer: Medicare Other | Source: Ambulatory Visit | Attending: Cardiology | Admitting: Cardiology

## 2019-01-11 DIAGNOSIS — I6523 Occlusion and stenosis of bilateral carotid arteries: Secondary | ICD-10-CM | POA: Insufficient documentation

## 2019-01-23 ENCOUNTER — Other Ambulatory Visit: Payer: Self-pay | Admitting: Cardiology

## 2019-02-13 NOTE — Progress Notes (Signed)
Cardiology Office Note:    Date:  02/14/2019   ID:  Kenneth Poole, DOB 18-Aug-1945, MRN [id number]  PCP:  Hulan Fess, MD  Cardiologist:  No primary care provider on file.    Referring MD: Hulan Fess, MD   Chief Complaint  Patient presents with  . Coronary Artery Disease  . Hypertension  . Hyperlipidemia    History of Present Illness:    Kenneth Poole is a 73 y.o. male with a hx of ASCADs/p PTCA of circumflex lesion in 2004and cathin June 2009 with 50% LAD. He also hasHTN and dyslipidemia.  Nuclear stress test for CP 01/2017 showed no ischemia.  He is here today for followup and is doing well.  He denies any chest pain or pressure, SOB, DOE, PND, orthopnea, LE edema, dizziness, palpitations or syncope. He is compliant with his meds and is tolerating meds with no SE.    Past Medical History:  Diagnosis Date  . Back pain   . Carotid artery disease (HCC)    1-39% bilateral carotid stenosis by dopplers 12/2018  . Coronary artery disease    s.p PTCA of circumflex lesion in 2004, cardiologist Dr Radford Pax, nonobstructive CAD on CATH in June 2009 with 50% LAD  . Dermatitis   . Diverticulosis   . Dyslipidemia   . GERD (gastroesophageal reflux disease)   . Hypercholesteremia   . Hypercholesteremia   . Hypertension   . Obesity   . Peripheral neuropathy   . Psoriasis   . Urticaria     Past Surgical History:  Procedure Laterality Date  . APPENDECTOMY    . BACK SURGERY    . CARDIAC CATHETERIZATION  June 2009  . CHOLECYSTECTOMY    . CORONARY STENT PLACEMENT    . KIDNEY STONE SURGERY    . TONSILLECTOMY      Current Medications: Current Meds  Medication Sig  . aspirin 81 MG tablet Take 81 mg by mouth daily.  Marland Kitchen augmented betamethasone dipropionate (DIPROLENE-AF) 0.05 % cream Apply 1 application topically 2 (two) times daily.  . calcipotriene-betamethasone (TACLONEX) ointment Apply 1 application topically 2 (two) times daily.  . calcium carbonate (TUMS - DOSED IN  MG ELEMENTAL CALCIUM) 500 MG chewable tablet Chew 2 tablets by mouth as needed for indigestion or heartburn.  . doxazosin (CARDURA) 4 MG tablet Take 4 mg by mouth daily.   Marland Kitchen EPINEPHrine (EPIPEN IJ) Inject as directed as needed (Anaphylaxis).   . Evolocumab (REPATHA SURECLICK) [id number] MG/ML SOAJ Inject 1 pen into the skin every 14 (fourteen) days.  Marland Kitchen ezetimibe (ZETIA) 10 MG tablet Take 1 tablet (10 mg total) by mouth daily.  . fish oil-omega-3 fatty acids 1000 MG capsule Take 2 g by mouth 2 (two) times daily.   . hydrochlorothiazide (HYDRODIURIL) 25 MG tablet Take 25 mg by mouth daily.  Marland Kitchen ketoconazole (NIZORAL) 2 % cream Apply 1 application topically daily.  Marland Kitchen KLOR-CON M20 20 MEQ tablet TAKE 1 TABLET BY MOUTH ONCE DAILY  . lisinopril (ZESTRIL) 20 MG tablet Take 0.5 tablets (10 mg total) by mouth daily. Please keep upcoming appt in September for future refills. Thank you  . metoprolol tartrate (LOPRESSOR) 25 MG tablet Take 0.5 tablets (12.5 mg total) by mouth 2 (two) times daily. Please keep upcoming appt in September for future refills. Thank you  . Multiple Vitamin (MULTIVITAMIN) capsule Take 1 capsule by mouth daily.  . nitroGLYCERIN (NITROSTAT) 0.4 MG SL tablet Place 1 tablet (0.4 mg total) under the tongue every 5 (five) minutes  as needed for chest pain.  Marland Kitchen omeprazole (PRILOSEC) 20 MG capsule Take 20 mg by mouth 2 (two) times daily. Insurance will not cover the 40mg  capsule. 40 mg bid     Allergies:   Apple cider vinegar, Crestor [rosuvastatin calcium], Erythromycin base, Other, Ace inhibitors, Bee venom, Fenofibrate, and Simcor [niacin-simvastatin er]   Social History   Socioeconomic History  . Marital status: Married    Spouse name: Not on file  . Number of children: Not on file  . Years of education: Not on file  . Highest education level: Not on file  Occupational History  . Not on file  Social Needs  . Financial resource strain: Not on file  . Food insecurity    Worry: Not on file     Inability: Not on file  . Transportation needs    Medical: Not on file    Non-medical: Not on file  Tobacco Use  . Smoking status: Former Smoker    Quit date: 05/24/1989    Years since quitting: 29.7  . Smokeless tobacco: Never Used  Substance and Sexual Activity  . Alcohol use: No  . Drug use: No  . Sexual activity: Not on file  Lifestyle  . Physical activity    Days per week: Not on file    Minutes per session: Not on file  . Stress: Not on file  Relationships  . Social Herbalist on phone: Not on file    Gets together: Not on file    Attends religious service: Not on file    Active member of club or organization: Not on file    Attends meetings of clubs or organizations: Not on file    Relationship status: Not on file  Other Topics Concern  . Not on file  Social History Narrative  . Not on file     Family History: The patient's family history includes Alzheimer's disease in his mother; Heart attack in his father.  ROS:   Please see the history of present illness.    ROS  All other systems reviewed and negative.   EKGs/Labs/Other Studies Reviewed:    The following studies were reviewed today: none  EKG:  EKG is  ordered today.  The ekg ordered today demonstrates NSR at 62 bpm  Recent Labs: 04/04/2018: ALT 64   Recent Lipid Panel    Component Value Date/Time   CHOL 114 04/04/2018 0827   TRIG 155 (H) 04/04/2018 0827   HDL 42 04/04/2018 0827   CHOLHDL 2.7 04/04/2018 0827   CHOLHDL 5.9 (H) 11/27/2015 0801   VLDL 38 (H) 11/27/2015 0801   LDLCALC 41 04/04/2018 0827   LDLDIRECT 146 (H) 04/09/2015 0802    Physical Exam:    VS:  BP 110/62   Pulse 62   Ht 5\' 8"  (1.727 m)   Wt 208 lb (94.3 kg)   SpO2 96%   BMI 31.63 kg/m     Wt Readings from Last 3 Encounters:  02/14/19 208 lb (94.3 kg)  01/20/18 203 lb 1.9 oz (92.1 kg)  08/29/17 200 lb (90.7 kg)     GEN:  Well nourished, well developed in no acute distress HEENT: Normal NECK: No JVD;  No carotid bruits LYMPHATICS: No lymphadenopathy CARDIAC: RRR, no murmurs, rubs, gallops RESPIRATORY:  Clear to auscultation without rales, wheezing or rhonchi  ABDOMEN: Soft, non-tender, non-distended MUSCULOSKELETAL:  No edema; No deformity  SKIN: Warm and dry NEUROLOGIC:  Alert and oriented x 3 PSYCHIATRIC:  Normal affect   ASSESSMENT:    1. Coronary artery disease involving native coronary artery of native heart without angina pectoris   2. Essential hypertension   3. Bilateral carotid artery stenosis   4. Mixed hyperlipidemia    PLAN:    In order of problems listed above:   1.  ASCAD - ASCADs/p PTCA of circumflex lesion in 2004and cathin June 2009 with 50% LAD. Nuclear stress test 01/2017 showed no ischemia. He has not had any anginal sx.  He will continue on ASA 81mg  daily, BB and Repatha,   2.  HTN - BP is well controlled.  He will continue on Doxazosin 4mg  daily, HCTZ 25mg  daily, Lisinopril 10mg  daily and Lopressor 12.5mg  BID.    3.  Bilateral carotid artery stenosis - Carotid dopplers 1-39% by dopplers 12/2018.  Continue ASA and statin. Repeat dopplers in 2 years.   4.  Hyperlipidemia with LDL goal < 70. Check FLP and ALT.  He will continue on Zetia 10mg  daily and Repatha.     Medication Adjustments/Labs and Tests Ordered: Current medicines are reviewed at length with the patient today.  Concerns regarding medicines are outlined above.  No orders of the defined types were placed in this encounter.  No orders of the defined types were placed in this encounter.   Signed, Fransico Him, MD  02/14/2019 8:04 AM    [place]

## 2019-02-14 ENCOUNTER — Encounter: Payer: Self-pay | Admitting: Cardiology

## 2019-02-14 ENCOUNTER — Other Ambulatory Visit: Payer: Self-pay

## 2019-02-14 ENCOUNTER — Ambulatory Visit (INDEPENDENT_AMBULATORY_CARE_PROVIDER_SITE_OTHER): Payer: Medicare Other | Admitting: Cardiology

## 2019-02-14 VITALS — BP 110/62 | HR 62 | Ht 68.0 in | Wt 208.0 lb

## 2019-02-14 DIAGNOSIS — I10 Essential (primary) hypertension: Secondary | ICD-10-CM | POA: Diagnosis not present

## 2019-02-14 DIAGNOSIS — I2510 Atherosclerotic heart disease of native coronary artery without angina pectoris: Secondary | ICD-10-CM

## 2019-02-14 DIAGNOSIS — E782 Mixed hyperlipidemia: Secondary | ICD-10-CM

## 2019-02-14 DIAGNOSIS — Z23 Encounter for immunization: Secondary | ICD-10-CM | POA: Diagnosis not present

## 2019-02-14 DIAGNOSIS — I6523 Occlusion and stenosis of bilateral carotid arteries: Secondary | ICD-10-CM | POA: Diagnosis not present

## 2019-02-14 LAB — LIPID PANEL
Chol/HDL Ratio: 2.3 ratio (ref 0.0–5.0)
Cholesterol, Total: 94 mg/dL — ABNORMAL LOW (ref 100–199)
HDL: 41 mg/dL (ref >39)
LDL Chol Calc (NIH): 29 mg/dL (ref 0–99)
Triglycerides: 138 mg/dL (ref 0–149)
VLDL Cholesterol Cal: 24 mg/dL (ref 5–40)

## 2019-02-14 LAB — BASIC METABOLIC PANEL
BUN/Creatinine Ratio: 15 (ref 10–24)
BUN: 12 mg/dL (ref 8–27)
CO2: 22 mmol/L (ref 20–29)
Calcium: 9.5 mg/dL (ref 8.6–10.2)
Chloride: 105 mmol/L (ref 96–106)
Creatinine, Ser: 0.78 mg/dL (ref 0.76–1.27)
GFR calc Af Amer: 105 mL/min/{1.73_m2} (ref >59)
GFR calc non Af Amer: 91 mL/min/{1.73_m2} (ref >59)
Glucose: 115 mg/dL — ABNORMAL HIGH (ref 65–99)
Potassium: 4 mmol/L (ref 3.5–5.2)
Sodium: 142 mmol/L (ref 134–144)

## 2019-02-14 LAB — ALT: ALT: 54 IU/L — ABNORMAL HIGH (ref 0–44)

## 2019-02-14 MED ORDER — NITROGLYCERIN 0.4 MG SL SUBL: 0.4000 mg | SUBLINGUAL_TABLET | SUBLINGUAL | 3 refills | Status: DC | PRN

## 2019-02-14 NOTE — Patient Instructions (Addendum)
Medication Instructions:  Your physician recommends that you continue on your current medications as directed. Please refer to the Current Medication list given to you today.  If you need a refill on your cardiac medications before your next appointment, please call your pharmacy.   Lab work: TODAY:  LIPID, ALT, & BMET  If you have labs (blood work) drawn today and your tests are completely normal, you will receive your results only by: Marland Kitchen MyChart Message (if you have MyChart) OR . A paper copy in the mail If you have any lab test that is abnormal or we need to change your treatment, we will call you to review the results.  Testing/Procedures: None ordered  Follow-Up: At [facility], you and your health needs are our priority.  As part of our continuing mission to provide you with exceptional heart care, we have created designated Provider Care Teams.  These Care Teams include your primary Cardiologist (physician) and Advanced Practice Providers (APPs -  Physician Assistants and Nurse Practitioners) who all work together to provide you with the care you need, when you need it. You will need a follow up appointment in 12 months.  Please call our office 2 months in advance to schedule this appointment.  You may see Dr. Fransico Him or one of the following Advanced Practice Providers on your designated Care Team:   [place], PA-C Melina Copa, PA-C . Ermalinda Barrios, PA-C  Any Other Special Instructions Will Be Listed Below (If Applicable).

## 2019-02-23 ENCOUNTER — Other Ambulatory Visit: Payer: Self-pay | Admitting: Cardiology

## 2019-03-22 DIAGNOSIS — Z0000 Encounter for general adult medical examination without abnormal findings: Secondary | ICD-10-CM | POA: Diagnosis not present

## 2019-03-22 DIAGNOSIS — L409 Psoriasis, unspecified: Secondary | ICD-10-CM | POA: Diagnosis not present

## 2019-03-22 DIAGNOSIS — Z1159 Encounter for screening for other viral diseases: Secondary | ICD-10-CM | POA: Diagnosis not present

## 2019-03-22 DIAGNOSIS — Z125 Encounter for screening for malignant neoplasm of prostate: Secondary | ICD-10-CM | POA: Diagnosis not present

## 2019-03-22 DIAGNOSIS — K760 Fatty (change of) liver, not elsewhere classified: Secondary | ICD-10-CM | POA: Diagnosis not present

## 2019-03-22 DIAGNOSIS — D509 Iron deficiency anemia, unspecified: Secondary | ICD-10-CM | POA: Diagnosis not present

## 2019-03-22 DIAGNOSIS — E785 Hyperlipidemia, unspecified: Secondary | ICD-10-CM | POA: Diagnosis not present

## 2019-03-22 DIAGNOSIS — R7301 Impaired fasting glucose: Secondary | ICD-10-CM | POA: Diagnosis not present

## 2019-03-22 DIAGNOSIS — K22719 Barrett's esophagus with dysplasia, unspecified: Secondary | ICD-10-CM | POA: Diagnosis not present

## 2019-03-22 DIAGNOSIS — L570 Actinic keratosis: Secondary | ICD-10-CM | POA: Diagnosis not present

## 2019-03-22 DIAGNOSIS — Z86010 Personal history of colonic polyps: Secondary | ICD-10-CM | POA: Diagnosis not present

## 2019-03-22 DIAGNOSIS — I10 Essential (primary) hypertension: Secondary | ICD-10-CM | POA: Diagnosis not present

## 2019-04-23 ENCOUNTER — Telehealth: Payer: Self-pay | Admitting: Pharmacist

## 2019-04-23 NOTE — Telephone Encounter (Signed)
PA for Repatha extended to 04/22/2020

## 2019-04-26 ENCOUNTER — Other Ambulatory Visit: Payer: Self-pay | Admitting: Cardiology

## 2019-05-11 ENCOUNTER — Ambulatory Visit: Payer: Medicare Other | Attending: Internal Medicine

## 2019-05-11 DIAGNOSIS — Z20822 Contact with and (suspected) exposure to covid-19: Secondary | ICD-10-CM

## 2019-05-13 LAB — NOVEL CORONAVIRUS, NAA: SARS-CoV-2, NAA: NOT DETECTED

## 2019-06-20 ENCOUNTER — Ambulatory Visit: Payer: Medicare Other

## 2019-06-25 ENCOUNTER — Other Ambulatory Visit: Payer: Self-pay | Admitting: Cardiology

## 2019-06-29 ENCOUNTER — Ambulatory Visit: Payer: Medicare Other | Attending: Internal Medicine

## 2019-06-29 DIAGNOSIS — Z23 Encounter for immunization: Secondary | ICD-10-CM

## 2019-06-29 NOTE — Progress Notes (Signed)
   Covid-19 Vaccination Clinic  Name:  Kenneth Poole    MRN: [id number] DOB: 04-Apr-1946  06/29/2019  [name] was observed post Covid-19 immunization for 15 minutes without incidence. He was provided with Vaccine Information Sheet and instruction to access the V-Safe system.   [name] was instructed to call 911 with any severe reactions post vaccine: Marland Kitchen Difficulty breathing  . Swelling of your face and throat  . A fast heartbeat  . A bad rash all over your body  . Dizziness and weakness    Immunizations Administered    Name Date Dose VIS Date Route   Pfizer COVID-19 Vaccine 06/29/2019 11:59 AM 0.3 mL 05/04/2019 Intramuscular   Manufacturer: [place]   Lot: [id number]   [place]: [id number]

## 2019-07-01 ENCOUNTER — Ambulatory Visit: Payer: Medicare Other

## 2019-07-03 DIAGNOSIS — K529 Noninfective gastroenteritis and colitis, unspecified: Secondary | ICD-10-CM | POA: Diagnosis not present

## 2019-07-03 DIAGNOSIS — K219 Gastro-esophageal reflux disease without esophagitis: Secondary | ICD-10-CM | POA: Diagnosis not present

## 2019-07-03 DIAGNOSIS — Z86010 Personal history of colonic polyps: Secondary | ICD-10-CM | POA: Diagnosis not present

## 2019-07-03 DIAGNOSIS — K3189 Other diseases of stomach and duodenum: Secondary | ICD-10-CM | POA: Diagnosis not present

## 2019-07-24 ENCOUNTER — Ambulatory Visit: Payer: Medicare Other | Attending: Internal Medicine

## 2019-07-24 DIAGNOSIS — Z23 Encounter for immunization: Secondary | ICD-10-CM | POA: Insufficient documentation

## 2019-07-24 NOTE — Progress Notes (Signed)
   Covid-19 Vaccination Clinic  Name:  LYDON WENZINGER    MRN: [id number] DOB: 01/21/1946  07/24/2019  Mr. Filter was observed post Covid-19 immunization for 15 minutes without incident. He was provided with Vaccine Information Sheet and instruction to access the V-Safe system.   Mr. Duty was instructed to call 911 with any severe reactions post vaccine: Marland Kitchen Difficulty breathing  . Swelling of face and throat  . A fast heartbeat  . A bad rash all over body  . Dizziness and weakness   Immunizations Administered    Name Date Dose VIS Date Route   Pfizer COVID-19 Vaccine 07/24/2019 11:56 AM 0.3 mL 05/04/2019 Intramuscular   Manufacturer: [place]   Lot: [id number]   [place]: [id number]

## 2019-07-28 ENCOUNTER — Other Ambulatory Visit: Payer: Self-pay | Admitting: Cardiology

## 2019-07-30 DIAGNOSIS — K529 Noninfective gastroenteritis and colitis, unspecified: Secondary | ICD-10-CM | POA: Diagnosis not present

## 2019-07-30 DIAGNOSIS — K219 Gastro-esophageal reflux disease without esophagitis: Secondary | ICD-10-CM | POA: Diagnosis not present

## 2019-07-30 DIAGNOSIS — Z86010 Personal history of colonic polyps: Secondary | ICD-10-CM | POA: Diagnosis not present

## 2019-07-30 DIAGNOSIS — K3189 Other diseases of stomach and duodenum: Secondary | ICD-10-CM | POA: Diagnosis not present

## 2019-09-03 DIAGNOSIS — Z1159 Encounter for screening for other viral diseases: Secondary | ICD-10-CM | POA: Diagnosis not present

## 2019-09-06 DIAGNOSIS — K2950 Unspecified chronic gastritis without bleeding: Secondary | ICD-10-CM | POA: Diagnosis not present

## 2019-09-06 DIAGNOSIS — D123 Benign neoplasm of transverse colon: Secondary | ICD-10-CM | POA: Diagnosis not present

## 2019-09-06 DIAGNOSIS — K2940 Chronic atrophic gastritis without bleeding: Secondary | ICD-10-CM | POA: Diagnosis not present

## 2019-09-06 DIAGNOSIS — D122 Benign neoplasm of ascending colon: Secondary | ICD-10-CM | POA: Diagnosis not present

## 2019-09-06 DIAGNOSIS — K2100 Gastro-esophageal reflux disease with esophagitis, without bleeding: Secondary | ICD-10-CM | POA: Diagnosis not present

## 2019-09-06 DIAGNOSIS — Z86010 Personal history of colonic polyps: Secondary | ICD-10-CM | POA: Diagnosis not present

## 2019-09-06 DIAGNOSIS — K317 Polyp of stomach and duodenum: Secondary | ICD-10-CM | POA: Diagnosis not present

## 2019-09-06 DIAGNOSIS — K229 Disease of esophagus, unspecified: Secondary | ICD-10-CM | POA: Diagnosis not present

## 2019-09-06 DIAGNOSIS — K2930 Chronic superficial gastritis without bleeding: Secondary | ICD-10-CM | POA: Diagnosis not present

## 2019-09-12 DIAGNOSIS — D123 Benign neoplasm of transverse colon: Secondary | ICD-10-CM | POA: Diagnosis not present

## 2019-09-12 DIAGNOSIS — K317 Polyp of stomach and duodenum: Secondary | ICD-10-CM | POA: Diagnosis not present

## 2019-09-12 DIAGNOSIS — K2940 Chronic atrophic gastritis without bleeding: Secondary | ICD-10-CM | POA: Diagnosis not present

## 2019-09-12 DIAGNOSIS — D122 Benign neoplasm of ascending colon: Secondary | ICD-10-CM | POA: Diagnosis not present

## 2019-09-12 DIAGNOSIS — K2930 Chronic superficial gastritis without bleeding: Secondary | ICD-10-CM | POA: Diagnosis not present

## 2019-09-12 DIAGNOSIS — K2100 Gastro-esophageal reflux disease with esophagitis, without bleeding: Secondary | ICD-10-CM | POA: Diagnosis not present

## 2019-09-24 ENCOUNTER — Emergency Department (HOSPITAL_BASED_OUTPATIENT_CLINIC_OR_DEPARTMENT_OTHER)
Admission: EM | Admit: 2019-09-24 | Discharge: 2019-09-24 | Disposition: A | Discharge status: Home / Self Care | Payer: Medicare Other | Attending: Emergency Medicine | Admitting: Emergency Medicine

## 2019-09-24 ENCOUNTER — Encounter (HOSPITAL_BASED_OUTPATIENT_CLINIC_OR_DEPARTMENT_OTHER): Payer: Self-pay | Admitting: Emergency Medicine

## 2019-09-24 ENCOUNTER — Emergency Department (HOSPITAL_BASED_OUTPATIENT_CLINIC_OR_DEPARTMENT_OTHER): Payer: Medicare Other

## 2019-09-24 ENCOUNTER — Other Ambulatory Visit: Payer: Self-pay

## 2019-09-24 DIAGNOSIS — I10 Essential (primary) hypertension: Secondary | ICD-10-CM | POA: Insufficient documentation

## 2019-09-24 DIAGNOSIS — Z7982 Long term (current) use of aspirin: Secondary | ICD-10-CM | POA: Diagnosis not present

## 2019-09-24 DIAGNOSIS — M5432 Sciatica, left side: Secondary | ICD-10-CM | POA: Diagnosis not present

## 2019-09-24 DIAGNOSIS — Z87891 Personal history of nicotine dependence: Secondary | ICD-10-CM | POA: Insufficient documentation

## 2019-09-24 DIAGNOSIS — I259 Chronic ischemic heart disease, unspecified: Secondary | ICD-10-CM | POA: Diagnosis not present

## 2019-09-24 DIAGNOSIS — M5442 Lumbago with sciatica, left side: Secondary | ICD-10-CM | POA: Diagnosis not present

## 2019-09-24 DIAGNOSIS — M545 Low back pain: Secondary | ICD-10-CM | POA: Diagnosis not present

## 2019-09-24 DIAGNOSIS — Z79899 Other long term (current) drug therapy: Secondary | ICD-10-CM | POA: Diagnosis not present

## 2019-09-24 DIAGNOSIS — M79605 Pain in left leg: Secondary | ICD-10-CM | POA: Diagnosis not present

## 2019-09-24 MED ORDER — OXYCODONE-ACETAMINOPHEN 5-325 MG PO TABS: 1.0000 | ORAL_TABLET | Freq: Four times a day (QID) | ORAL | 0 refills | Status: DC | PRN

## 2019-09-24 MED ORDER — PREDNISONE 10 MG (21) PO TBPK
ORAL_TABLET | Freq: Every day | ORAL | 0 refills | Status: DC
Start: 2019-09-24 — End: 2020-03-28

## 2019-09-24 MED ORDER — OXYCODONE-ACETAMINOPHEN 5-325 MG PO TABS
1.0000 | ORAL_TABLET | Freq: Once | ORAL | Status: AC
  Administered 2019-09-24: 1 via ORAL
  Filled 2019-09-24: qty 1

## 2019-09-24 NOTE — ED Provider Notes (Signed)
[facility] EMERGENCY DEPARTMENT Provider Note   CSN: [id number] Arrival date & time: 09/24/19  [id number]     History Chief Complaint  Patient presents with  . Leg Pain    Kenneth Poole is a 74 y.o. male.  He has a history of coronary disease and peripheral neuropathy.  Prior back surgery.  Complaining of 2 or 3 days of left buttock pain radiating down his leg into his left ankle.  Burning and electrical shocking in nature.  Causes him to have trouble walking.  Has some neuropathy in his lower leg that has not changed.  No real weakness.  No bowel or bladder incontinence.  No fever.  No known trauma.  The history is provided by the patient.  Leg Pain Location:  Buttock, leg and ankle Time since incident:  3 days Injury: no   Buttock location:  L buttock Leg location:  L leg, L upper leg and L lower leg Ankle location:  L ankle Pain details:    Quality:  Burning   Severity:  Moderate   Onset quality:  Gradual   Timing:  Constant   Progression:  Worsening Chronicity:  New Dislocation: no   Foreign body present:  No foreign bodies Relieved by:  Nothing Worsened by:  Bearing weight Ineffective treatments:  None tried Associated symptoms: back pain and numbness (neuropathy)   Associated symptoms: no decreased ROM, no fatigue, no fever, no muscle weakness and no neck pain        Past Medical History:  Diagnosis Date  . Back pain   . Carotid artery disease (HCC)    1-39% bilateral carotid stenosis by dopplers 12/2018  . Coronary artery disease    s.p PTCA of circumflex lesion in 2004, cardiologist Dr Radford Pax, nonobstructive CAD on CATH in June 2009 with 50% LAD  . Dermatitis   . Diverticulosis   . Dyslipidemia   . GERD (gastroesophageal reflux disease)   . Hypercholesteremia   . Hypercholesteremia   . Hypertension   . Obesity   . Peripheral neuropathy   . Psoriasis   . Urticaria     Patient Active Problem List   Diagnosis Date Noted  . Elevated liver  enzymes 05/10/2014  . PVD (peripheral vascular disease) ([place]) 05/11/2013  . Carotid stenosis   . Hypertension   . Coronary artery disease   . Hypercholesteremia     Past Surgical History:  Procedure Laterality Date  . APPENDECTOMY    . BACK SURGERY    . CARDIAC CATHETERIZATION  June 2009  . CHOLECYSTECTOMY    . CORONARY STENT PLACEMENT    . KIDNEY STONE SURGERY    . TONSILLECTOMY         Family History  Problem Relation Age of Onset  . Alzheimer's disease Mother   . Heart attack Father     Social History   Tobacco Use  . Smoking status: Former Smoker    Quit date: 05/24/1989    Years since quitting: 30.3  . Smokeless tobacco: Never Used  Substance Use Topics  . Alcohol use: No  . Drug use: No    Home Medications Prior to Admission medications   Medication Sig Start Date End Date Taking? Authorizing Provider  albuterol (VENTOLIN HFA) 108 (90 Base) MCG/ACT inhaler Inhale 2 puffs into the lungs every 6 (six) hours as needed for wheezing or shortness of breath.   Yes [provider]  aspirin 81 MG tablet Take 81 mg by mouth daily.  [provider]  calcipotriene-betamethasone (TACLONEX) ointment Apply 1 application topically 2 (two) times daily.    [provider]  colestipol (COLESTID) 1 g tablet Take 2 g by mouth at bedtime. 07/30/19   [provider]  doxazosin (CARDURA) 4 MG tablet Take 4 mg by mouth daily.     [provider]  EPINEPHrine (EPIPEN IJ) Inject as directed as needed (Anaphylaxis).     [provider]  ezetimibe (ZETIA) 10 MG tablet Take 1 tablet by mouth once daily 02/23/19   Turner, Eber Hong, MD  fish oil-omega-3 fatty acids 1000 MG capsule Take 2 g by mouth 2 (two) times daily.     [provider]  hydrochlorothiazide (HYDRODIURIL) 25 MG tablet Take 25 mg by mouth daily.    [provider]  ketoconazole (NIZORAL) 2 % cream Apply 1 application topically daily.    [provider]  lisinopril (ZESTRIL) 20 MG tablet TAKE 1/2 (ONE-HALF) TABLET BY MOUTH ONCE DAILY. 04/26/19   Sueanne Margarita, MD  metoprolol tartrate (LOPRESSOR) 25 MG tablet TAKE 1/2 (ONE-HALF) TABLET BY MOUTH TWICE DAILY 04/26/19   Sueanne Margarita, MD  Multiple Vitamin (MULTIVITAMIN) capsule Take 1 capsule by mouth daily.    [provider]  nitroGLYCERIN (NITROSTAT) 0.4 MG SL tablet Place 1 tablet (0.4 mg total) under the tongue every 5 (five) minutes as needed for chest pain. 02/14/19   Sueanne Margarita, MD  omeprazole (PRILOSEC) 40 MG capsule Take 40 mg by mouth 2 (two) times daily. 08/21/19   [provider]  oxyCODONE-acetaminophen (PERCOCET/ROXICET) 5-325 MG tablet Take 1-2 tablets by mouth every 6 (six) hours as needed for severe pain. 09/24/19   Hayden Rasmussen, MD  potassium chloride SA (KLOR-CON) 20 MEQ tablet Take 1 tablet by mouth once daily 02/23/19   Turner, Eber Hong, MD  predniSONE (STERAPRED UNI-PAK 21 TAB) 10 MG (21) TBPK tablet Take by mouth daily. Take 6 tabs by mouth daily  for 2 days, then 5 tabs for 2 days, then 4 tabs for 2 days, then 3 tabs for 2 days, 2 tabs for 2 days, then 1 tab by mouth daily for 2 days 09/24/19   Hayden Rasmussen, MD  REPATHA SURECLICK [id number] MG/ML SOAJ INJECT 1 PEN INTO THE SKIN EVERY 14 DAYS 07/30/19   Sueanne Margarita, MD  [facility] injection  04/20/19   [provider]    Allergies    Apple cider vinegar, Crestor [rosuvastatin calcium], Erythromycin base, Other, Ace inhibitors, Bee venom, Fenofibrate, and Simcor [niacin-simvastatin er]  Review of Systems   Review of Systems  Constitutional: Negative for fatigue and fever.  HENT: Negative for sore throat.   Eyes: Negative for visual disturbance.  Respiratory: Negative for shortness of breath.   Cardiovascular: Negative for chest pain.  Gastrointestinal: Negative for abdominal pain.  Genitourinary: Negative for dysuria.  Musculoskeletal: Positive for back pain. Negative for neck pain.  Skin:  Negative for rash.  Neurological: Positive for numbness. Negative for weakness.    Physical Exam Updated Vital Signs BP 129/80 (BP Location: Right Arm)   Pulse 79   Temp 97.6 F (36.4 C) (Oral)   Resp 14   Ht 5\' 3"  (1.6 m)   Wt 93 kg   SpO2 100%   BMI 36.31 kg/m   Physical Exam Vitals and nursing note reviewed.  Constitutional:      Appearance: He is well-developed.  HENT:     Head: Normocephalic and atraumatic.  Eyes:  Conjunctiva/sclera: Conjunctivae normal.  Cardiovascular:     Rate and Rhythm: Normal rate and regular rhythm.     Heart sounds: No murmur.  Pulmonary:     Effort: Pulmonary effort is normal. No respiratory distress.     Breath sounds: Normal breath sounds.  Abdominal:     Palpations: Abdomen is soft.     Tenderness: There is no abdominal tenderness.  Musculoskeletal:        General: Tenderness present. No deformity.     Cervical back: Neck supple.     Comments: He has no midline spine tenderness.  He is tender through the left sciatic notch in the buttock.  Full range of motion at hip knee and ankle without any limitations.  Normal ankle reflexes.  Skin:    General: Skin is warm and dry.     Capillary Refill: Capillary refill takes less than 2 seconds.  Neurological:     General: No focal deficit present.     Mental Status: He is alert.     Motor: No weakness.     ED Results / Procedures / Treatments   Labs (all labs ordered are listed, but only abnormal results are displayed) Labs Reviewed - No data to display  EKG None  Radiology DG Lumbar Spine Complete  Result Date: 09/24/2019 CLINICAL DATA:  Left low back pain radiating to the left lower extremity for 3 days. No reported injury. EXAM: LUMBAR SPINE - COMPLETE 4+ VIEW COMPARISON:  03/02/2013 lumbar spine radiographs FINDINGS: This report assumes 5 non rib-bearing lumbar vertebrae. Lumbar vertebral body heights are preserved, with no fracture. Severe multilevel degenerative disc disease  throughout the visualized thoracolumbar spine, most prominent at L2-3 and L3-4, significantly worsened since 2014 radiographs. Stable bone cage in the L4-5 disc space. Minimal 2 mm retrolisthesis at L3-4, unchanged. Mild bilateral lower lumbar facet arthropathy. No aggressive appearing focal osseous lesions. Cholecystectomy clips are seen in the right upper quadrant of the abdomen. Abdominal aortic atherosclerosis. IMPRESSION: Severe multilevel lumbar degenerative disc disease, most prominent at L2-3 and L3-4, significantly worsened since 2014 radiographs. No acute osseous abnormality. Electronically Signed   By: Ilona Sorrel M.D.   On: 09/24/2019 11:03   US Venous Img Lower  Left (DVT Study)  Result Date: 09/24/2019 CLINICAL DATA:  Pain radiating down the left leg for the past 3 days. Evaluate for DVT. EXAM: LEFT LOWER EXTREMITY VENOUS DOPPLER ULTRASOUND TECHNIQUE: Gray-scale sonography with graded compression, as well as color Doppler and duplex ultrasound were performed to evaluate the lower extremity deep venous systems from the level of the common femoral vein and including the common femoral, femoral, profunda femoral, popliteal and calf veins including the posterior tibial, peroneal and gastrocnemius veins when visible. The superficial great saphenous vein was also interrogated. Spectral Doppler was utilized to evaluate flow at rest and with distal augmentation maneuvers in the common femoral, femoral and popliteal veins. COMPARISON:  Left lower extremity venous Doppler ultrasound-07/19/2012 (negative). FINDINGS: Contralateral Common Femoral Vein: Respiratory phasicity is normal and symmetric with the symptomatic side. No evidence of thrombus. Normal compressibility. Common Femoral Vein: No evidence of thrombus. Normal compressibility, respiratory phasicity and response to augmentation. Saphenofemoral Junction: No evidence of thrombus. Normal compressibility and flow on color Doppler imaging. Profunda  Femoral Vein: No evidence of thrombus. Normal compressibility and flow on color Doppler imaging. Femoral Vein: No evidence of thrombus. Normal compressibility, respiratory phasicity and response to augmentation. Popliteal Vein: No evidence of thrombus. Normal compressibility, respiratory phasicity and response to augmentation.  Calf Veins: No evidence of thrombus. Normal compressibility and flow on color Doppler imaging. Superficial Great Saphenous Vein: No evidence of thrombus. Normal compressibility. Venous Reflux:  None. Other Findings:  None. IMPRESSION: No evidence of DVT within the left lower extremity. Electronically Signed   By: Sandi Mariscal M.D.   On: 09/24/2019 11:12    Procedures Procedures (including critical care time)  Medications Ordered in ED Medications  oxyCODONE-acetaminophen (PERCOCET/ROXICET) 5-325 MG per tablet 1 tablet (has no administration in time range)    ED Course  I have reviewed the triage vital signs and the nursing notes.  Pertinent labs & imaging results that were available during my care of the patient were reviewed by me and considered in my medical decision making (see chart for details).  Clinical Course as of Sep 23 1708  Mon Sep 24, 2019  1106 Patient's lumbar spine x-ray interpreted by me as multilevel degenerative changes.   [MB]  1117 I reevaluated the patient and updated him on the results of his imaging.  He said his pain is somewhat improved.  He saw Dr. Trenton Gammon in the past for his back surgery so will give him the contact information to reach out to him again.  We discussed return instructions.  We will place him on steroids and a short prescription of some pain medicine.   [MB]    Clinical Course User Index [MB] Hayden Rasmussen, MD   MDM Rules/Calculators/A&P                     This patient complains of left buttock down left leg pain.; this involves an extensive number of treatment Options and is a complaint that carries with it a high risk  of complications and Morbidity. The differential includes musculoskeletal strain, radiculopathy, disc herniation, spinal fracture, DVT  I ordered medication Percocet with improvement in pain I ordered imaging studies which included lumbar spine x-ray and duplex left lower extremity and I independently    visualized and interpreted imaging which showed significant DJD and disc disease, no DVT Additional history obtained from patient's wife Previous records obtained and reviewed in epic.  Prior back surgery in 2000 with Dr. Trenton Gammon  After the interventions stated above, I reevaluated the patient and found symptoms to be improved.  No neuro deficits.  Will treat with pain medication and steroids and asked patient to closely follow-up with Dr. Trenton Gammon or somebody at [facility].  Return instructions discussed.  With patient's current exam does not require emergent MRI.  We discussed if he has any progression of symptoms would need to present to [facility] longer, which has MRI capabilities   Final Clinical Impression(s) / ED Diagnoses Final diagnoses:  Sciatica of left side    Rx / DC Orders ED Discharge Orders         Ordered    oxyCODONE-acetaminophen (PERCOCET/ROXICET) 5-325 MG tablet  Every 6 hours PRN     09/24/19 1121    predniSONE (STERAPRED UNI-PAK 21 TAB) 10 MG (21) TBPK tablet  Daily     09/24/19 1121           Hayden Rasmussen, MD 09/24/19 1714

## 2019-09-24 NOTE — Discharge Instructions (Signed)
You were seen in the emergency department for worsening left-sided buttock pain radiating down your left leg.  You had an ultrasound that did not show any evidence of a DVT.  You also had regular x-rays of your back that showed a lot of arthritis changes.  Your symptoms are likely related to herniated disc.  Please contact your neurosurgeon for close follow-up.  We are prescribing you a course of steroid taper and some pain medicine.  Please return to the emergency department for any worsening or concerning symptoms.

## 2019-09-24 NOTE — ED Triage Notes (Signed)
Left leg pain for 3 days.  Pt states he did lift some heavy items.  No difficulty with urination.

## 2019-09-24 NOTE — ED Notes (Signed)
ED Provider at bedside. 

## 2019-09-27 DIAGNOSIS — M5416 Radiculopathy, lumbar region: Secondary | ICD-10-CM | POA: Diagnosis not present

## 2019-09-27 DIAGNOSIS — Z6829 Body mass index (BMI) 29.0-29.9, adult: Secondary | ICD-10-CM | POA: Diagnosis not present

## 2019-10-03 DIAGNOSIS — M5117 Intervertebral disc disorders with radiculopathy, lumbosacral region: Secondary | ICD-10-CM | POA: Diagnosis not present

## 2019-10-03 DIAGNOSIS — M48061 Spinal stenosis, lumbar region without neurogenic claudication: Secondary | ICD-10-CM | POA: Diagnosis not present

## 2019-10-03 DIAGNOSIS — M5416 Radiculopathy, lumbar region: Secondary | ICD-10-CM | POA: Diagnosis not present

## 2019-10-03 DIAGNOSIS — M4726 Other spondylosis with radiculopathy, lumbar region: Secondary | ICD-10-CM | POA: Diagnosis not present

## 2019-10-04 DIAGNOSIS — I10 Essential (primary) hypertension: Secondary | ICD-10-CM | POA: Diagnosis not present

## 2019-10-04 DIAGNOSIS — M5416 Radiculopathy, lumbar region: Secondary | ICD-10-CM | POA: Diagnosis not present

## 2019-10-04 DIAGNOSIS — Z6829 Body mass index (BMI) 29.0-29.9, adult: Secondary | ICD-10-CM | POA: Diagnosis not present

## 2019-10-31 DIAGNOSIS — M25512 Pain in left shoulder: Secondary | ICD-10-CM | POA: Diagnosis not present

## 2019-12-17 DIAGNOSIS — M5416 Radiculopathy, lumbar region: Secondary | ICD-10-CM | POA: Diagnosis not present

## 2019-12-19 DIAGNOSIS — M5416 Radiculopathy, lumbar region: Secondary | ICD-10-CM | POA: Diagnosis not present

## 2019-12-19 DIAGNOSIS — M2560 Stiffness of unspecified joint, not elsewhere classified: Secondary | ICD-10-CM | POA: Diagnosis not present

## 2019-12-19 DIAGNOSIS — M6281 Muscle weakness (generalized): Secondary | ICD-10-CM | POA: Diagnosis not present

## 2019-12-19 DIAGNOSIS — M21372 Foot drop, left foot: Secondary | ICD-10-CM | POA: Diagnosis not present

## 2019-12-19 DIAGNOSIS — R262 Difficulty in walking, not elsewhere classified: Secondary | ICD-10-CM | POA: Diagnosis not present

## 2019-12-19 DIAGNOSIS — R292 Abnormal reflex: Secondary | ICD-10-CM | POA: Diagnosis not present

## 2019-12-21 DIAGNOSIS — R292 Abnormal reflex: Secondary | ICD-10-CM | POA: Diagnosis not present

## 2019-12-21 DIAGNOSIS — R262 Difficulty in walking, not elsewhere classified: Secondary | ICD-10-CM | POA: Diagnosis not present

## 2019-12-21 DIAGNOSIS — M2560 Stiffness of unspecified joint, not elsewhere classified: Secondary | ICD-10-CM | POA: Diagnosis not present

## 2019-12-21 DIAGNOSIS — M21372 Foot drop, left foot: Secondary | ICD-10-CM | POA: Diagnosis not present

## 2019-12-21 DIAGNOSIS — M6281 Muscle weakness (generalized): Secondary | ICD-10-CM | POA: Diagnosis not present

## 2019-12-21 DIAGNOSIS — M5416 Radiculopathy, lumbar region: Secondary | ICD-10-CM | POA: Diagnosis not present

## 2019-12-24 DIAGNOSIS — R292 Abnormal reflex: Secondary | ICD-10-CM | POA: Diagnosis not present

## 2019-12-24 DIAGNOSIS — R262 Difficulty in walking, not elsewhere classified: Secondary | ICD-10-CM | POA: Diagnosis not present

## 2019-12-24 DIAGNOSIS — M2560 Stiffness of unspecified joint, not elsewhere classified: Secondary | ICD-10-CM | POA: Diagnosis not present

## 2019-12-24 DIAGNOSIS — M6281 Muscle weakness (generalized): Secondary | ICD-10-CM | POA: Diagnosis not present

## 2019-12-24 DIAGNOSIS — M21372 Foot drop, left foot: Secondary | ICD-10-CM | POA: Diagnosis not present

## 2019-12-24 DIAGNOSIS — M5416 Radiculopathy, lumbar region: Secondary | ICD-10-CM | POA: Diagnosis not present

## 2019-12-28 DIAGNOSIS — M6281 Muscle weakness (generalized): Secondary | ICD-10-CM | POA: Diagnosis not present

## 2019-12-28 DIAGNOSIS — R262 Difficulty in walking, not elsewhere classified: Secondary | ICD-10-CM | POA: Diagnosis not present

## 2019-12-28 DIAGNOSIS — M5416 Radiculopathy, lumbar region: Secondary | ICD-10-CM | POA: Diagnosis not present

## 2019-12-28 DIAGNOSIS — R292 Abnormal reflex: Secondary | ICD-10-CM | POA: Diagnosis not present

## 2019-12-28 DIAGNOSIS — M2560 Stiffness of unspecified joint, not elsewhere classified: Secondary | ICD-10-CM | POA: Diagnosis not present

## 2019-12-28 DIAGNOSIS — M21372 Foot drop, left foot: Secondary | ICD-10-CM | POA: Diagnosis not present

## 2020-01-07 DIAGNOSIS — M6281 Muscle weakness (generalized): Secondary | ICD-10-CM | POA: Diagnosis not present

## 2020-01-07 DIAGNOSIS — R292 Abnormal reflex: Secondary | ICD-10-CM | POA: Diagnosis not present

## 2020-01-07 DIAGNOSIS — M5416 Radiculopathy, lumbar region: Secondary | ICD-10-CM | POA: Diagnosis not present

## 2020-01-07 DIAGNOSIS — M2560 Stiffness of unspecified joint, not elsewhere classified: Secondary | ICD-10-CM | POA: Diagnosis not present

## 2020-01-07 DIAGNOSIS — M21372 Foot drop, left foot: Secondary | ICD-10-CM | POA: Diagnosis not present

## 2020-01-07 DIAGNOSIS — R262 Difficulty in walking, not elsewhere classified: Secondary | ICD-10-CM | POA: Diagnosis not present

## 2020-01-08 DIAGNOSIS — H02831 Dermatochalasis of right upper eyelid: Secondary | ICD-10-CM | POA: Diagnosis not present

## 2020-01-08 DIAGNOSIS — H04123 Dry eye syndrome of bilateral lacrimal glands: Secondary | ICD-10-CM | POA: Diagnosis not present

## 2020-01-08 DIAGNOSIS — H25813 Combined forms of age-related cataract, bilateral: Secondary | ICD-10-CM | POA: Diagnosis not present

## 2020-01-08 DIAGNOSIS — H02834 Dermatochalasis of left upper eyelid: Secondary | ICD-10-CM | POA: Diagnosis not present

## 2020-01-08 DIAGNOSIS — H17822 Peripheral opacity of cornea, left eye: Secondary | ICD-10-CM | POA: Diagnosis not present

## 2020-01-09 DIAGNOSIS — M5416 Radiculopathy, lumbar region: Secondary | ICD-10-CM | POA: Diagnosis not present

## 2020-01-09 DIAGNOSIS — M2560 Stiffness of unspecified joint, not elsewhere classified: Secondary | ICD-10-CM | POA: Diagnosis not present

## 2020-01-09 DIAGNOSIS — M6281 Muscle weakness (generalized): Secondary | ICD-10-CM | POA: Diagnosis not present

## 2020-01-09 DIAGNOSIS — R262 Difficulty in walking, not elsewhere classified: Secondary | ICD-10-CM | POA: Diagnosis not present

## 2020-01-09 DIAGNOSIS — R292 Abnormal reflex: Secondary | ICD-10-CM | POA: Diagnosis not present

## 2020-01-09 DIAGNOSIS — M21372 Foot drop, left foot: Secondary | ICD-10-CM | POA: Diagnosis not present

## 2020-01-18 ENCOUNTER — Other Ambulatory Visit: Payer: Self-pay | Admitting: Cardiology

## 2020-01-18 DIAGNOSIS — R292 Abnormal reflex: Secondary | ICD-10-CM | POA: Diagnosis not present

## 2020-01-18 DIAGNOSIS — R262 Difficulty in walking, not elsewhere classified: Secondary | ICD-10-CM | POA: Diagnosis not present

## 2020-01-18 DIAGNOSIS — M2560 Stiffness of unspecified joint, not elsewhere classified: Secondary | ICD-10-CM | POA: Diagnosis not present

## 2020-01-18 DIAGNOSIS — M21372 Foot drop, left foot: Secondary | ICD-10-CM | POA: Diagnosis not present

## 2020-01-18 DIAGNOSIS — M5416 Radiculopathy, lumbar region: Secondary | ICD-10-CM | POA: Diagnosis not present

## 2020-01-18 DIAGNOSIS — M6281 Muscle weakness (generalized): Secondary | ICD-10-CM | POA: Diagnosis not present

## 2020-01-21 DIAGNOSIS — M6281 Muscle weakness (generalized): Secondary | ICD-10-CM | POA: Diagnosis not present

## 2020-01-21 DIAGNOSIS — R292 Abnormal reflex: Secondary | ICD-10-CM | POA: Diagnosis not present

## 2020-01-21 DIAGNOSIS — M21372 Foot drop, left foot: Secondary | ICD-10-CM | POA: Diagnosis not present

## 2020-01-21 DIAGNOSIS — R262 Difficulty in walking, not elsewhere classified: Secondary | ICD-10-CM | POA: Diagnosis not present

## 2020-01-21 DIAGNOSIS — M2560 Stiffness of unspecified joint, not elsewhere classified: Secondary | ICD-10-CM | POA: Diagnosis not present

## 2020-01-21 DIAGNOSIS — M5416 Radiculopathy, lumbar region: Secondary | ICD-10-CM | POA: Diagnosis not present

## 2020-01-22 ENCOUNTER — Other Ambulatory Visit: Payer: Self-pay | Admitting: Cardiology

## 2020-01-25 DIAGNOSIS — M21372 Foot drop, left foot: Secondary | ICD-10-CM | POA: Diagnosis not present

## 2020-01-25 DIAGNOSIS — M2560 Stiffness of unspecified joint, not elsewhere classified: Secondary | ICD-10-CM | POA: Diagnosis not present

## 2020-01-25 DIAGNOSIS — R292 Abnormal reflex: Secondary | ICD-10-CM | POA: Diagnosis not present

## 2020-01-25 DIAGNOSIS — M5416 Radiculopathy, lumbar region: Secondary | ICD-10-CM | POA: Diagnosis not present

## 2020-01-25 DIAGNOSIS — R262 Difficulty in walking, not elsewhere classified: Secondary | ICD-10-CM | POA: Diagnosis not present

## 2020-01-25 DIAGNOSIS — M6281 Muscle weakness (generalized): Secondary | ICD-10-CM | POA: Diagnosis not present

## 2020-01-30 DIAGNOSIS — R292 Abnormal reflex: Secondary | ICD-10-CM | POA: Diagnosis not present

## 2020-01-30 DIAGNOSIS — M6281 Muscle weakness (generalized): Secondary | ICD-10-CM | POA: Diagnosis not present

## 2020-01-30 DIAGNOSIS — M21372 Foot drop, left foot: Secondary | ICD-10-CM | POA: Diagnosis not present

## 2020-01-30 DIAGNOSIS — M2560 Stiffness of unspecified joint, not elsewhere classified: Secondary | ICD-10-CM | POA: Diagnosis not present

## 2020-01-30 DIAGNOSIS — M5416 Radiculopathy, lumbar region: Secondary | ICD-10-CM | POA: Diagnosis not present

## 2020-01-30 DIAGNOSIS — R262 Difficulty in walking, not elsewhere classified: Secondary | ICD-10-CM | POA: Diagnosis not present

## 2020-02-01 DIAGNOSIS — M21372 Foot drop, left foot: Secondary | ICD-10-CM | POA: Diagnosis not present

## 2020-02-01 DIAGNOSIS — M2560 Stiffness of unspecified joint, not elsewhere classified: Secondary | ICD-10-CM | POA: Diagnosis not present

## 2020-02-01 DIAGNOSIS — R262 Difficulty in walking, not elsewhere classified: Secondary | ICD-10-CM | POA: Diagnosis not present

## 2020-02-01 DIAGNOSIS — M5416 Radiculopathy, lumbar region: Secondary | ICD-10-CM | POA: Diagnosis not present

## 2020-02-01 DIAGNOSIS — M6281 Muscle weakness (generalized): Secondary | ICD-10-CM | POA: Diagnosis not present

## 2020-02-01 DIAGNOSIS — R292 Abnormal reflex: Secondary | ICD-10-CM | POA: Diagnosis not present

## 2020-02-04 DIAGNOSIS — R262 Difficulty in walking, not elsewhere classified: Secondary | ICD-10-CM | POA: Diagnosis not present

## 2020-02-04 DIAGNOSIS — R292 Abnormal reflex: Secondary | ICD-10-CM | POA: Diagnosis not present

## 2020-02-04 DIAGNOSIS — M2560 Stiffness of unspecified joint, not elsewhere classified: Secondary | ICD-10-CM | POA: Diagnosis not present

## 2020-02-04 DIAGNOSIS — M5416 Radiculopathy, lumbar region: Secondary | ICD-10-CM | POA: Diagnosis not present

## 2020-02-04 DIAGNOSIS — M21372 Foot drop, left foot: Secondary | ICD-10-CM | POA: Diagnosis not present

## 2020-02-04 DIAGNOSIS — M6281 Muscle weakness (generalized): Secondary | ICD-10-CM | POA: Diagnosis not present

## 2020-02-08 DIAGNOSIS — R292 Abnormal reflex: Secondary | ICD-10-CM | POA: Diagnosis not present

## 2020-02-08 DIAGNOSIS — M21372 Foot drop, left foot: Secondary | ICD-10-CM | POA: Diagnosis not present

## 2020-02-08 DIAGNOSIS — M2560 Stiffness of unspecified joint, not elsewhere classified: Secondary | ICD-10-CM | POA: Diagnosis not present

## 2020-02-08 DIAGNOSIS — M6281 Muscle weakness (generalized): Secondary | ICD-10-CM | POA: Diagnosis not present

## 2020-02-08 DIAGNOSIS — R262 Difficulty in walking, not elsewhere classified: Secondary | ICD-10-CM | POA: Diagnosis not present

## 2020-02-08 DIAGNOSIS — M5416 Radiculopathy, lumbar region: Secondary | ICD-10-CM | POA: Diagnosis not present

## 2020-02-11 DIAGNOSIS — M21372 Foot drop, left foot: Secondary | ICD-10-CM | POA: Diagnosis not present

## 2020-02-11 DIAGNOSIS — M2560 Stiffness of unspecified joint, not elsewhere classified: Secondary | ICD-10-CM | POA: Diagnosis not present

## 2020-02-11 DIAGNOSIS — R292 Abnormal reflex: Secondary | ICD-10-CM | POA: Diagnosis not present

## 2020-02-11 DIAGNOSIS — M6281 Muscle weakness (generalized): Secondary | ICD-10-CM | POA: Diagnosis not present

## 2020-02-11 DIAGNOSIS — M5416 Radiculopathy, lumbar region: Secondary | ICD-10-CM | POA: Diagnosis not present

## 2020-02-11 DIAGNOSIS — R262 Difficulty in walking, not elsewhere classified: Secondary | ICD-10-CM | POA: Diagnosis not present

## 2020-02-13 DIAGNOSIS — R292 Abnormal reflex: Secondary | ICD-10-CM | POA: Diagnosis not present

## 2020-02-13 DIAGNOSIS — M6281 Muscle weakness (generalized): Secondary | ICD-10-CM | POA: Diagnosis not present

## 2020-02-13 DIAGNOSIS — M21372 Foot drop, left foot: Secondary | ICD-10-CM | POA: Diagnosis not present

## 2020-02-13 DIAGNOSIS — M5416 Radiculopathy, lumbar region: Secondary | ICD-10-CM | POA: Diagnosis not present

## 2020-02-13 DIAGNOSIS — M2560 Stiffness of unspecified joint, not elsewhere classified: Secondary | ICD-10-CM | POA: Diagnosis not present

## 2020-02-13 DIAGNOSIS — R262 Difficulty in walking, not elsewhere classified: Secondary | ICD-10-CM | POA: Diagnosis not present

## 2020-02-18 ENCOUNTER — Other Ambulatory Visit: Payer: Self-pay | Admitting: Cardiology

## 2020-02-18 DIAGNOSIS — M21372 Foot drop, left foot: Secondary | ICD-10-CM | POA: Diagnosis not present

## 2020-02-18 DIAGNOSIS — Z23 Encounter for immunization: Secondary | ICD-10-CM | POA: Diagnosis not present

## 2020-02-18 DIAGNOSIS — M2560 Stiffness of unspecified joint, not elsewhere classified: Secondary | ICD-10-CM | POA: Diagnosis not present

## 2020-02-18 DIAGNOSIS — M6281 Muscle weakness (generalized): Secondary | ICD-10-CM | POA: Diagnosis not present

## 2020-02-18 DIAGNOSIS — M5416 Radiculopathy, lumbar region: Secondary | ICD-10-CM | POA: Diagnosis not present

## 2020-02-18 DIAGNOSIS — R262 Difficulty in walking, not elsewhere classified: Secondary | ICD-10-CM | POA: Diagnosis not present

## 2020-02-18 DIAGNOSIS — R292 Abnormal reflex: Secondary | ICD-10-CM | POA: Diagnosis not present

## 2020-02-21 DIAGNOSIS — M5416 Radiculopathy, lumbar region: Secondary | ICD-10-CM | POA: Diagnosis not present

## 2020-03-05 ENCOUNTER — Ambulatory Visit: Payer: Medicare Other | Admitting: Cardiology

## 2020-03-13 DIAGNOSIS — I10 Essential (primary) hypertension: Secondary | ICD-10-CM | POA: Diagnosis not present

## 2020-03-13 DIAGNOSIS — M5416 Radiculopathy, lumbar region: Secondary | ICD-10-CM | POA: Diagnosis not present

## 2020-03-20 ENCOUNTER — Other Ambulatory Visit: Payer: Self-pay | Admitting: Cardiology

## 2020-03-26 ENCOUNTER — Telehealth: Payer: Self-pay | Admitting: Cardiology

## 2020-03-26 NOTE — Telephone Encounter (Signed)
Spoke with [name] From [place]. She could not see on her end where anyone had tried to contact our office and could not assist me any further with what the call was in regards to.

## 2020-03-26 NOTE — Telephone Encounter (Signed)
New message:      Berton Lan from [facility] calling concering this patient. She would like for for a nurse to call her back today. [phone]

## 2020-03-28 ENCOUNTER — Ambulatory Visit (INDEPENDENT_AMBULATORY_CARE_PROVIDER_SITE_OTHER): Payer: Medicare Other | Admitting: Cardiology

## 2020-03-28 ENCOUNTER — Encounter: Payer: Self-pay | Admitting: Cardiology

## 2020-03-28 ENCOUNTER — Other Ambulatory Visit: Payer: Self-pay

## 2020-03-28 VITALS — BP 116/68 | HR 65 | Ht 69.0 in | Wt 203.4 lb

## 2020-03-28 DIAGNOSIS — I10 Essential (primary) hypertension: Secondary | ICD-10-CM | POA: Diagnosis not present

## 2020-03-28 DIAGNOSIS — I2510 Atherosclerotic heart disease of native coronary artery without angina pectoris: Secondary | ICD-10-CM

## 2020-03-28 DIAGNOSIS — I6523 Occlusion and stenosis of bilateral carotid arteries: Secondary | ICD-10-CM

## 2020-03-28 DIAGNOSIS — E7800 Pure hypercholesterolemia, unspecified: Secondary | ICD-10-CM | POA: Diagnosis not present

## 2020-03-28 LAB — BASIC METABOLIC PANEL
BUN/Creatinine Ratio: 12 (ref 10–24)
BUN: 9 mg/dL (ref 8–27)
CO2: 25 mmol/L (ref 20–29)
Calcium: 9.5 mg/dL (ref 8.6–10.2)
Chloride: 102 mmol/L (ref 96–106)
Creatinine, Ser: 0.75 mg/dL — ABNORMAL LOW (ref 0.76–1.27)
GFR calc Af Amer: 106 mL/min/{1.73_m2} (ref >59)
GFR calc non Af Amer: 92 mL/min/{1.73_m2} (ref >59)
Glucose: 111 mg/dL — ABNORMAL HIGH (ref 65–99)
Potassium: 4.5 mmol/L (ref 3.5–5.2)
Sodium: 138 mmol/L (ref 134–144)

## 2020-03-28 LAB — LIPID PANEL
Chol/HDL Ratio: 2.1 ratio (ref 0.0–5.0)
Cholesterol, Total: 90 mg/dL — ABNORMAL LOW (ref 100–199)
HDL: 43 mg/dL (ref >39)
LDL Chol Calc (NIH): 22 mg/dL (ref 0–99)
Triglycerides: 151 mg/dL — ABNORMAL HIGH (ref 0–149)
VLDL Cholesterol Cal: 25 mg/dL (ref 5–40)

## 2020-03-28 NOTE — Progress Notes (Signed)
Cardiology Office Note:    Date:  03/28/2020   ID:  Kenneth Poole, DOB 25-Apr-1946, MRN [id number]  PCP:  Hulan Fess, MD  Cardiologist:  Fransico Him, MD    Referring MD: Hulan Fess, MD   Chief Complaint  Patient presents with  . Coronary Artery Disease  . Hypertension  . Hyperlipidemia    History of Present Illness:    Kenneth Poole is a 74 y.o. male with a hx of ASCADs/p PTCA of circumflex lesion in 2004and cathin June 2009 with 50% LAD. He also hasHTN and dyslipidemia.  Nuclear stress test for CP 01/2017 showed no ischemia.    He is here today for followup and is doing well.  He denies any chest pain or pressure, SOB, DOE, PND, orthopnea, LE edema, dizziness, palpitations or syncope. He is compliant with his meds and is tolerating meds with no SE.    Past Medical History:  Diagnosis Date  . Back pain   . Carotid artery disease (HCC)    1-39% bilateral carotid stenosis by dopplers 12/2018  . Coronary artery disease    s.p PTCA of circumflex lesion in 2004, cardiologist Dr Radford Pax, nonobstructive CAD on CATH in June 2009 with 50% LAD  . Dermatitis   . Diverticulosis   . Dyslipidemia   . GERD (gastroesophageal reflux disease)   . Hypercholesteremia   . Hypercholesteremia   . Hypertension   . Obesity   . Peripheral neuropathy   . Psoriasis   . Urticaria     Past Surgical History:  Procedure Laterality Date  . APPENDECTOMY    . BACK SURGERY    . CARDIAC CATHETERIZATION  June 2009  . CHOLECYSTECTOMY    . CORONARY STENT PLACEMENT    . KIDNEY STONE SURGERY    . TONSILLECTOMY      Current Medications: Current Meds  Medication Sig  . albuterol (VENTOLIN HFA) 108 (90 Base) MCG/ACT inhaler Inhale 2 puffs into the lungs every 6 (six) hours as needed for wheezing or shortness of breath.  Marland Kitchen aspirin 81 MG tablet Take 81 mg by mouth daily.  . colestipol (COLESTID) 1 g tablet Take 2 g by mouth at bedtime.  Marland Kitchen doxazosin (CARDURA) 4 MG tablet Take 4 mg by  mouth daily.   Marland Kitchen EPINEPHrine (EPIPEN IJ) Inject as directed as needed (Anaphylaxis).   Marland Kitchen ezetimibe (ZETIA) 10 MG tablet Take 1 tablet by mouth once daily  . fish oil-omega-3 fatty acids 1000 MG capsule Take 2 g by mouth 2 (two) times daily.   . hydrochlorothiazide (HYDRODIURIL) 25 MG tablet Take 25 mg by mouth daily.  Marland Kitchen ketoconazole (NIZORAL) 2 % cream Apply 1 application topically daily.  Marland Kitchen lisinopril (ZESTRIL) 20 MG tablet Take 1 tablet (20 mg total) by mouth daily. Please schedule appt for future refills. 1st attempt  . metoprolol tartrate (LOPRESSOR) 25 MG tablet Take 1/2 (one-half) tablet by mouth twice daily  . Multiple Vitamin (MULTIVITAMIN) capsule Take 1 capsule by mouth daily.  . nitroGLYCERIN (NITROSTAT) 0.4 MG SL tablet Place 1 tablet (0.4 mg total) under the tongue every 5 (five) minutes as needed for chest pain.  Marland Kitchen omeprazole (PRILOSEC) 40 MG capsule Take 40 mg by mouth 2 (two) times daily.  . potassium chloride SA (KLOR-CON) 20 MEQ tablet Take 1 tablet by mouth once daily  . REPATHA SURECLICK 010 MG/ML SOAJ INJECT 1 PEN INTO THE SKIN EVERY 14 DAYS     Allergies:   Apple cider vinegar, Bee pollen, Erythromycin base,  Other, Rosuvastatin calcium, Sulfa antibiotics, Tetracycline, Ace inhibitors, Bee venom, Fenofibrate, and Simcor [niacin-simvastatin er]   Social History   Socioeconomic History  . Marital status: Married    Spouse name: Not on file  . Number of children: Not on file  . Years of education: Not on file  . Highest education level: Not on file  Occupational History  . Not on file  Tobacco Use  . Smoking status: Former Smoker    Quit date: 05/24/1989    Years since quitting: 30.8  . Smokeless tobacco: Never Used  Vaping Use  . Vaping Use: Never used  Substance and Sexual Activity  . Alcohol use: No  . Drug use: No  . Sexual activity: Not on file  Other Topics Concern  . Not on file  Social History Narrative  . Not on file   Social Determinants of Health    Financial Resource Strain:   . Difficulty of Paying Living Expenses: Not on file  Food Insecurity:   . Worried About Charity fundraiser in the Last Year: Not on file  . Ran Out of Food in the Last Year: Not on file  Transportation Needs:   . Lack of Transportation (Medical): Not on file  . Lack of Transportation (Non-Medical): Not on file  Physical Activity:   . Days of Exercise per Week: Not on file  . Minutes of Exercise per Session: Not on file  Stress:   . Feeling of Stress : Not on file  Social Connections:   . Frequency of Communication with Friends and Family: Not on file  . Frequency of Social Gatherings with Friends and Family: Not on file  . Attends Religious Services: Not on file  . Active Member of Clubs or Organizations: Not on file  . Attends Archivist Meetings: Not on file  . Marital Status: Not on file     Family History: The patient's family history includes Alzheimer's disease in his mother; Heart attack in his father.  ROS:   Please see the history of present illness.    ROS  All other systems reviewed and negative.   EKGs/Labs/Other Studies Reviewed:    The following studies were reviewed today: EKG  EKG:  EKG is  ordered today.  The ekg ordered today demonstrates NSR with no ST changes  Recent Labs: No results found for requested labs within last 8760 hours.   Recent Lipid Panel    Component Value Date/Time   CHOL 94 (L) 02/14/2019 0822   TRIG 138 02/14/2019 0822   HDL 41 02/14/2019 0822   CHOLHDL 2.3 02/14/2019 0822   CHOLHDL 5.9 (H) 11/27/2015 0801   VLDL 38 (H) 11/27/2015 0801   LDLCALC 29 02/14/2019 0822   LDLDIRECT 146 (H) 04/09/2015 0802    Physical Exam:    VS:  BP 116/68   Pulse 65   Ht 5\' 9"  (1.753 m)   Wt 203 lb 6.4 oz (92.3 kg)   BMI 30.04 kg/m     Wt Readings from Last 3 Encounters:  03/28/20 203 lb 6.4 oz (92.3 kg)  09/24/19 205 lb (93 kg)  02/14/19 208 lb (94.3 kg)     GEN: Well nourished, well  developed in no acute distress HEENT: Normal NECK: No JVD; No carotid bruits LYMPHATICS: No lymphadenopathy CARDIAC:RRR, no murmurs, rubs, gallops RESPIRATORY:  Clear to auscultation without rales, wheezing or rhonchi  ABDOMEN: Soft, non-tender, non-distended MUSCULOSKELETAL:  No edema; No deformity  SKIN: Warm and dry NEUROLOGIC:  Alert and oriented x 3 PSYCHIATRIC:  Normal affect    ASSESSMENT:    1. Coronary artery disease involving native coronary artery of native heart without angina pectoris   2. Primary hypertension   3. Bilateral carotid artery stenosis   4. Hypercholesteremia    PLAN:    In order of problems listed above:   1.  ASCAD -s/p PTCA of circumflex lesion in 2004and cathin June 2009 with 50% LAD.  -Nuclear stress test 01/2017 showed no ischemia.  -he denies any anginal sx since I saw him last -He will continue on ASA 81mg  daily, BB and Repatha  2.  HTN  -BP well controlled on exam today -continue on Doxazosin 4mg  daily, HCTZ 25mg  daily, Lisinopril 20mg  daily and Lopressor 12.5mg  BID.   -repeat BMET  3.  Bilateral carotid artery stenosis  -Carotid dopplers 1-39% by dopplers 12/2018.   -repeat dopplers 12/2020 -continue statin and ASA.   4.  Hyperlipidemia  -LDL goal < 70.  -check FLP and ALT -continue on Zetia 10mg  daily and Repatha.     Medication Adjustments/Labs and Tests Ordered: Current medicines are reviewed at length with the patient today.  Concerns regarding medicines are outlined above.  Orders Placed This Encounter  Procedures  . EKG 12-Lead   No orders of the defined types were placed in this encounter.   Signed, Fransico Him, MD  03/28/2020 9:55 AM    [place]

## 2020-03-28 NOTE — Patient Instructions (Signed)
Medication Instructions:  Your physician recommends that you continue on your current medications as directed. Please refer to the Current Medication list given to you today.  Labwork: You will have labs drawn today: FLP and BMP  Testing/Procedures: None ordered.  Follow-Up: Your physician recommends that you schedule a follow-up appointment in: 12 months with Dr. Radford Pax   Any Other Special Instructions Will Be Listed Below (If Applicable).     If you need a refill on your cardiac medications before your next appointment, please call your pharmacy.

## 2020-03-28 NOTE — Addendum Note (Signed)
Addended by: Dollene Primrose on: 03/28/2020 10:04 AM   Modules accepted: Orders

## 2020-04-07 DIAGNOSIS — J22 Unspecified acute lower respiratory infection: Secondary | ICD-10-CM | POA: Diagnosis not present

## 2020-04-07 DIAGNOSIS — U071 COVID-19: Secondary | ICD-10-CM | POA: Diagnosis not present

## 2020-04-07 DIAGNOSIS — J9801 Acute bronchospasm: Secondary | ICD-10-CM | POA: Diagnosis not present

## 2020-04-08 DIAGNOSIS — U071 COVID-19: Secondary | ICD-10-CM | POA: Diagnosis not present

## 2020-04-08 DIAGNOSIS — J22 Unspecified acute lower respiratory infection: Secondary | ICD-10-CM | POA: Diagnosis not present

## 2020-04-10 ENCOUNTER — Other Ambulatory Visit (HOSPITAL_COMMUNITY): Payer: Self-pay | Admitting: Physician Assistant

## 2020-04-10 ENCOUNTER — Emergency Department (HOSPITAL_COMMUNITY)
Admission: EM | Admit: 2020-04-10 | Discharge: 2020-04-10 | Disposition: A | Discharge status: Home / Self Care | Payer: Medicare Other | Attending: Emergency Medicine | Admitting: Emergency Medicine

## 2020-04-10 ENCOUNTER — Encounter: Payer: Self-pay | Admitting: Physician Assistant

## 2020-04-10 ENCOUNTER — Telehealth (HOSPITAL_COMMUNITY): Payer: Self-pay

## 2020-04-10 ENCOUNTER — Emergency Department (HOSPITAL_COMMUNITY): Payer: Medicare Other

## 2020-04-10 ENCOUNTER — Other Ambulatory Visit: Payer: Self-pay

## 2020-04-10 ENCOUNTER — Encounter (HOSPITAL_COMMUNITY): Payer: Self-pay | Admitting: Emergency Medicine

## 2020-04-10 DIAGNOSIS — Z79899 Other long term (current) drug therapy: Secondary | ICD-10-CM | POA: Diagnosis not present

## 2020-04-10 DIAGNOSIS — U071 COVID-19: Secondary | ICD-10-CM | POA: Insufficient documentation

## 2020-04-10 DIAGNOSIS — R042 Hemoptysis: Secondary | ICD-10-CM | POA: Diagnosis not present

## 2020-04-10 DIAGNOSIS — I10 Essential (primary) hypertension: Secondary | ICD-10-CM | POA: Insufficient documentation

## 2020-04-10 DIAGNOSIS — Z87891 Personal history of nicotine dependence: Secondary | ICD-10-CM | POA: Insufficient documentation

## 2020-04-10 DIAGNOSIS — Z7982 Long term (current) use of aspirin: Secondary | ICD-10-CM | POA: Insufficient documentation

## 2020-04-10 DIAGNOSIS — Z955 Presence of coronary angioplasty implant and graft: Secondary | ICD-10-CM | POA: Diagnosis not present

## 2020-04-10 DIAGNOSIS — R0602 Shortness of breath: Secondary | ICD-10-CM | POA: Diagnosis not present

## 2020-04-10 DIAGNOSIS — I2510 Atherosclerotic heart disease of native coronary artery without angina pectoris: Secondary | ICD-10-CM | POA: Diagnosis not present

## 2020-04-10 DIAGNOSIS — R059 Cough, unspecified: Secondary | ICD-10-CM | POA: Diagnosis not present

## 2020-04-10 DIAGNOSIS — J189 Pneumonia, unspecified organism: Secondary | ICD-10-CM | POA: Diagnosis not present

## 2020-04-10 LAB — CBC
HCT: 34.4 % — ABNORMAL LOW (ref 39.0–52.0)
Hemoglobin: 11.8 g/dL — ABNORMAL LOW (ref 13.0–17.0)
MCH: 28 pg (ref 26.0–34.0)
MCHC: 34.3 g/dL (ref 30.0–36.0)
MCV: 81.7 fL (ref 80.0–100.0)
Platelets: 172 10*3/uL (ref 150–400)
RBC: 4.21 MIL/uL — ABNORMAL LOW (ref 4.22–5.81)
RDW: 13.8 % (ref 11.5–15.5)
WBC: 3.2 10*3/uL — ABNORMAL LOW (ref 4.0–10.5)
nRBC: 0 % (ref 0.0–0.2)

## 2020-04-10 LAB — BASIC METABOLIC PANEL
Anion gap: 14 (ref 5–15)
BUN: 10 mg/dL (ref 8–23)
CO2: 23 mmol/L (ref 22–32)
Calcium: 9.1 mg/dL (ref 8.9–10.3)
Chloride: 103 mmol/L (ref 98–111)
Creatinine, Ser: 0.76 mg/dL (ref 0.61–1.24)
GFR, Estimated: >60 mL/min (ref >60)
Glucose, Bld: 118 mg/dL — ABNORMAL HIGH (ref 70–99)
Potassium: 3.5 mmol/L (ref 3.5–5.1)
Sodium: 140 mmol/L (ref 135–145)

## 2020-04-10 LAB — BRAIN NATRIURETIC PEPTIDE: B Natriuretic Peptide: 77.3 pg/mL (ref 0.0–100.0)

## 2020-04-10 MED ORDER — IOHEXOL 350 MG/ML SOLN
60.0000 mL | Freq: Once | INTRAVENOUS | Status: AC | PRN
  Administered 2020-04-10: 60 mL via INTRAVENOUS

## 2020-04-10 MED ORDER — BENZONATATE 100 MG PO CAPS: 100.0000 mg | ORAL_CAPSULE | Freq: Three times a day (TID) | ORAL | 0 refills | Status: DC

## 2020-04-10 MED ORDER — SODIUM CHLORIDE 0.9 % IV BOLUS
500.0000 mL | Freq: Once | INTRAVENOUS | Status: AC
  Administered 2020-04-10: 500 mL via INTRAVENOUS

## 2020-04-10 NOTE — ED Notes (Signed)
Patient ambulated on room air independently with steady gait. Maintained SpO2 of 98%.

## 2020-04-10 NOTE — Telephone Encounter (Signed)
Called to discuss with patient about Covid symptoms and the use of the monoclonal antibody infusion for those with mild to moderate Covid symptoms and at a high risk of hospitalization.     Pt appears to qualify for this infusion due to co-morbid conditions and/or a member of an at-risk group in accordance with the FDA Emergency Use Authorization.   Spoke with wife, Nathaneal Sommers who gave information for patient.  Risk factors include: CAD, age >31, BMI >25 Symptom onset:04/03/20  Tested positive for COVID 19: 04/08/20 at [place]  Discussed information regarding costs of monoclonal antibody treatment, given both CPT & REV codes, and encouraged wife to call their health insurance company to verify cost of treatment that pt will be financially responsible for.  Pre-screened by RN and ready for APP to call and further discuss and/or schedule pt for antibody infusion appt.

## 2020-04-10 NOTE — ED Triage Notes (Signed)
Patient arrives to ED from home with complaints of hemoptyses that started today. Pt states he has been COVID positive since 11/16, has had all 3 shots. Booster shot last week. States mild SOB, no fevers, mild fatigue.

## 2020-04-10 NOTE — Discharge Instructions (Addendum)
At this time there does not appear to be the presence of an emergent medical condition, however there is always the potential for conditions to change. Please read and follow the below instructions.  Please return to the Emergency Department immediately for any new or worsening symptoms. Please be sure to follow up with your Primary Care Provider within one week regarding your visit today; please call their office to schedule an appointment even if you are feeling better for a follow-up visit. Please go to the monoclonal antibody infusion center for treatment.  I have contacted down and referred you back for infusion.  They should call you in the next day to schedule an appointment for treatment.  Be sure to answer the phone call. Please continue to use your home pulse oximeter to monitor your oxygen level.  If your oxygen level is below 90%, call 911 and return to the emergency department for evaluation. Incidentally your CT scan today showed atherosclerosis.  Please discuss these incidental findings with your primary care provider at your follow-up visit. Continue to quarantine until 10 days after symptom onset to avoid spread of virus to other people.  April 16, 2020. You may use the medication Tessalon as prescribed for cough.  Go to the nearest Emergency Department immediately if: You have fever or chills You have trouble breathing. You have pain or pressure in your chest. You have confusion. You have bluish lips and fingernails. You have difficulty waking from sleep. You have any new/concerning or worsening of symptoms  Please read the additional information packets attached to your discharge summary.  Do not take your medicine if  develop an itchy rash, swelling in your mouth or lips, or difficulty breathing; call 911 and seek immediate emergency medical attention if this occurs.  You may review your lab tests and imaging results in their entirety on your MyChart account.  Please  discuss all results of fully with your primary care provider and other specialist at your follow-up visit.  Note: Portions of this text may have been transcribed using voice recognition software. Every effort was made to ensure accuracy; however, inadvertent computerized transcription errors may still be present.

## 2020-04-10 NOTE — Progress Notes (Signed)
I connected by phone with Kenneth Poole on 04/10/2020 at 4:12 PM to discuss the potential use of a new treatment for mild to moderate COVID-19 viral infection in non-hospitalized patients.  This patient is a 74 y.o. male that meets the FDA criteria for Emergency Use Authorization of COVID monoclonal antibody casirivimab/imdevimab, bamlanivimab/eteseviamb, or sotrovimab.  Has a (+) direct SARS-CoV-2 viral test result  Has mild or moderate COVID-19   Is NOT hospitalized due to COVID-19  Is within 10 days of symptom onset  Has at least one of the high risk factor(s) for progression to severe COVID-19 and/or hospitalization as defined in EUA.  Specific high risk criteria : Older age (>/= 72 yo), BMI > 25 and Cardiovascular disease or hypertension   I have spoken and communicated the following to the patient or parent/caregiver regarding COVID monoclonal antibody treatment:  1. FDA has authorized the emergency use for the treatment of mild to moderate COVID-19 in adults and pediatric patients with positive results of direct SARS-CoV-2 viral testing who are 59 years of age and older weighing at least 40 kg, and who are at high risk for progressing to severe COVID-19 and/or hospitalization.  2. The significant known and potential risks and benefits of COVID monoclonal antibody, and the extent to which such potential risks and benefits are unknown.  3. Information on available alternative treatments and the risks and benefits of those alternatives, including clinical trials.  4. Patients treated with COVID monoclonal antibody should continue to self-isolate and use infection control measures (e.g., wear mask, isolate, social distance, avoid sharing personal items, clean and disinfect "high touch" surfaces, and frequent handwashing) according to CDC guidelines.   5. The patient or parent/caregiver has the option to accept or refuse COVID monoclonal antibody treatment.  After reviewing this  information with the patient, the patient has agreed to receive one of the available covid 19 monoclonal antibodies and will be provided an appropriate fact sheet prior to infusion. Kenneth Felix, PA-C 04/10/2020 4:12 PM

## 2020-04-10 NOTE — ED Provider Notes (Signed)
[facility] EMERGENCY DEPARTMENT Provider Note   CSN: [id number] Arrival date & time: 04/10/20  8786     History Chief Complaint  Patient presents with  . Hemoptysis    Kenneth Poole is a 74 y.o. male history of CAD, hypertension, hyperlipidemia, hypercholesterolemia, obesity.  Patient reports 5 days ago he developed mild nonproductive cough and generalized fatigue.  The symptoms have been constant slightly worsening since onset he developed shortness of breath with his cough starting yesterday.  He reported a small amount of hemoptysis this this morning.  He reports that he tested positive for COVID-19 2 days ago.  He reports he has had 3 Covid vaccines all with Octa, third shot was March 31, 2020.  He reports that shortness of breath is mild primarily with his cough no associated chest pain.  He reports fatigue as generalized without focal weakness.  Denies fall/injury, headache, sore throat, chest pain, pleurisy, abdominal pain, vomiting, diarrhea, dysuria/hematuria, numbness/weakness, tingling, extremity swelling or color change, fever/chills or any additional concerns.  HPI     Past Medical History:  Diagnosis Date  . Back pain   . Carotid artery disease (HCC)    1-39% bilateral carotid stenosis by dopplers 12/2018  . Coronary artery disease    s.p PTCA of circumflex lesion in 2004, cardiologist Dr Radford Pax, nonobstructive CAD on CATH in June 2009 with 50% LAD  . Dermatitis   . Diverticulosis   . Dyslipidemia   . GERD (gastroesophageal reflux disease)   . Hypercholesteremia   . Hypercholesteremia   . Hypertension   . Obesity   . Peripheral neuropathy   . Psoriasis   . Urticaria     Patient Active Problem List   Diagnosis Date Noted  . Elevated liver enzymes 05/10/2014  . PVD (peripheral vascular disease) ([place]) 05/11/2013  . Carotid stenosis   . Hypertension   . Coronary artery disease   . Hypercholesteremia     Past Surgical History:   Procedure Laterality Date  . APPENDECTOMY    . BACK SURGERY    . CARDIAC CATHETERIZATION  June 2009  . CHOLECYSTECTOMY    . CORONARY STENT PLACEMENT    . KIDNEY STONE SURGERY    . TONSILLECTOMY         Family History  Problem Relation Age of Onset  . Alzheimer's disease Mother   . Heart attack Father     Social History   Tobacco Use  . Smoking status: Former Smoker    Quit date: 05/24/1989    Years since quitting: 30.9  . Smokeless tobacco: Never Used  Vaping Use  . Vaping Use: Never used  Substance Use Topics  . Alcohol use: No  . Drug use: No    Home Medications Prior to Admission medications   Medication Sig Start Date End Date Taking? Authorizing Provider  albuterol (VENTOLIN HFA) 108 (90 Base) MCG/ACT inhaler Inhale 2 puffs into the lungs every 6 (six) hours as needed for wheezing or shortness of breath.   Yes [provider]  aspirin 81 MG tablet Take 81 mg by mouth daily.   Yes [provider]  colestipol (COLESTID) 1 g tablet Take 1 g by mouth at bedtime.  07/30/19  Yes [provider]  doxazosin (CARDURA) 4 MG tablet Take 4 mg by mouth daily.    Yes [provider]  EPINEPHrine (EPIPEN IJ) Inject as directed as needed (Anaphylaxis).    Yes [provider]  ezetimibe (ZETIA) 10 MG tablet  Take 1 tablet by mouth once daily Patient taking differently: Take 10 mg by mouth daily.  02/18/20  Yes Turner, Eber Hong, MD  fish oil-omega-3 fatty acids 1000 MG capsule Take 2 g by mouth 2 (two) times daily.    Yes [provider]  hydrochlorothiazide (HYDRODIURIL) 25 MG tablet Take 25 mg by mouth daily.   Yes [provider]  lisinopril (ZESTRIL) 20 MG tablet Take 1 tablet (20 mg total) by mouth daily. Please schedule appt for future refills. 1st attempt Patient taking differently: Take 10 mg by mouth daily. Please schedule appt for future refills. 1st attempt 01/18/20  Yes Turner, Eber Hong, MD  metoprolol tartrate  (LOPRESSOR) 25 MG tablet Take 1/2 (one-half) tablet by mouth twice daily Patient taking differently: Take 12.5 mg by mouth 2 (two) times daily.  03/21/20  Yes Turner, Eber Hong, MD  Multiple Vitamin (MULTIVITAMIN) capsule Take 1 capsule by mouth daily.   Yes [provider]  nitroGLYCERIN (NITROSTAT) 0.4 MG SL tablet Place 1 tablet (0.4 mg total) under the tongue every 5 (five) minutes as needed for chest pain. 02/14/19  Yes Turner, Eber Hong, MD  omeprazole (PRILOSEC) 40 MG capsule Take 40 mg by mouth 2 (two) times daily. 08/21/19  Yes [provider]  potassium chloride SA (KLOR-CON) 20 MEQ tablet Take 1 tablet by mouth once daily Patient taking differently: Take 20 mEq by mouth daily.  02/18/20  Yes Turner, Eber Hong, MD  REPATHA SURECLICK 921 MG/ML SOAJ INJECT 1 PEN INTO THE SKIN EVERY 14 DAYS Patient taking differently: Inject 140 mg into the skin every 14 (fourteen) days.  07/30/19  Yes Turner, Eber Hong, MD    Allergies    Apple cider vinegar, Bee pollen, Erythromycin base, Other, Rosuvastatin calcium, Sulfa antibiotics, Tetracycline, Ace inhibitors, Bee venom, Fenofibrate, and Simcor [niacin-simvastatin er]  Review of Systems   Review of Systems Ten systems are reviewed and are negative for acute change except as noted in the HPI  Physical Exam Updated Vital Signs BP 119/72   Pulse (!) 57   Temp 98.2 F (36.8 C) (Oral)   Resp 12   Ht 5\' 9"  (1.753 m)   Wt 92.1 kg   SpO2 98%   BMI 29.98 kg/m   Physical Exam Constitutional:      General: He is not in acute distress.    Appearance: Normal appearance. He is well-developed. He is not ill-appearing or diaphoretic.  HENT:     Head: Normocephalic and atraumatic.     Mouth/Throat:     Mouth: Mucous membranes are moist.     Pharynx: Oropharynx is clear.  Eyes:     General: Vision grossly intact. Gaze aligned appropriately.     Pupils: Pupils are equal, round, and reactive to light.  Neck:     Trachea: Trachea and  phonation normal.  Cardiovascular:     Rate and Rhythm: Normal rate and regular rhythm.     Pulses: Normal pulses.     Heart sounds: Normal heart sounds.  Pulmonary:     Effort: Pulmonary effort is normal. No respiratory distress.     Breath sounds: Normal breath sounds.  Abdominal:     General: There is no distension.     Palpations: Abdomen is soft.     Tenderness: There is no abdominal tenderness. There is no guarding or rebound.  Musculoskeletal:        General: Normal range of motion.     Cervical back: Normal range of  motion.     Right lower leg: No edema.     Left lower leg: No edema.  Skin:    General: Skin is warm and dry.  Neurological:     Mental Status: He is alert.     GCS: GCS eye subscore is 4. GCS verbal subscore is 5. GCS motor subscore is 6.     Comments: Speech is clear and goal oriented, follows commands Major Cranial nerves without deficit, no facial droop Moves extremities without ataxia, coordination intact  Psychiatric:        Behavior: Behavior normal.     ED Results / Procedures / Treatments   Labs (all labs ordered are listed, but only abnormal results are displayed) Labs Reviewed  CBC - Abnormal; Notable for the following components:      Result Value   WBC 3.2 (*)    RBC 4.21 (*)    Hemoglobin 11.8 (*)    HCT 34.4 (*)    All other components within normal limits  BASIC METABOLIC PANEL - Abnormal; Notable for the following components:   Glucose, Bld 118 (*)    All other components within normal limits  BRAIN NATRIURETIC PEPTIDE    EKG EKG Interpretation  Date/Time:  Thursday April 10 2020 14:36:16 EST Ventricular Rate:  62 PR Interval:    QRS Duration: 88 QT Interval:  423 QTC Calculation: 430 R Axis:   17 Text Interpretation: Sinus rhythm Low voltage, precordial leads RSR' in V1 or V2, right VCD or RVH Baseline wander in lead(s) V4 no acute ST/T changes no significant change since 2019 Confirmed by Sherwood Gambler [phone]) on  04/10/2020 2:40:45 PM   Radiology DG Chest 2 View  Result Date: 04/10/2020 CLINICAL DATA:  Coughing up blood.  COVID positive. EXAM: CHEST - 2 VIEW COMPARISON:  08/29/2017 chest radiograph and CT. FINDINGS: Prior examination had extensive patchy densities in the mid and lower left lung. There are few patchy densities in the left lower lung and not clear if this represents acute or chronic changes. Few densities at the right lung base may represent atelectasis. Low lung volumes. Heart size is within normal limits. No large pleural effusions. Bridging osteophytes in thoracic spine. IMPRESSION: Low lung volumes with patchy densities at the left lung base. Left basilar densities could represent acute or chronic changes. This could be further evaluated with CT or follow-up imaging to evaluate for stability or resolution. Electronically Signed   By: Markus Daft M.D.   On: 04/10/2020 11:03   CT Angio Chest PE W and/or Wo Contrast  Result Date: 04/10/2020 CLINICAL DATA:  Short of breath.  Positive for COVID-19. EXAM: CT ANGIOGRAPHY CHEST WITH CONTRAST TECHNIQUE: Multidetector CT imaging of the chest was performed using the standard protocol during bolus administration of intravenous contrast. Multiplanar CT image reconstructions and MIPs were obtained to evaluate the vascular anatomy. CONTRAST:  44mL OMNIPAQUE IOHEXOL 350 MG/ML SOLN COMPARISON:  08/29/2017. FINDINGS: Cardiovascular: Well opacified pulmonary arteries. No evidence of a pulmonary embolism. Heart top-normal in size. Three-vessel coronary artery calcifications. No pericardial effusion. Aorta is normal in caliber. Mild aortic atherosclerosis. No dissection. Mediastinum/Nodes: No enlarged mediastinal, hilar, or axillary lymph nodes. Thyroid gland, trachea, and esophagus demonstrate no significant findings. Lungs/Pleura: There are patchy bilateral areas of ground-glass lung opacity, most evident in the lower lobes. No evidence of pulmonary edema. No mass  or suspicious nodule. No pleural effusion or pneumothorax. Upper Abdomen: No acute findings. Musculoskeletal: No fracture or acute finding.  No bone lesion.  Review of the MIP images confirms the above findings. IMPRESSION: 1. No evidence of a pulmonary embolism. 2. Bilateral ground-glass lung opacities consistent with multifocal pneumonia and compatible with COVID-19 infection. Aortic Atherosclerosis (ICD10-I70.0). Electronically Signed   By: Lajean Manes M.D.   On: 04/10/2020 14:04    Procedures Procedures (including critical care time)  Medications Ordered in ED Medications  sodium chloride 0.9 % bolus 500 mL (500 mLs Intravenous New Bag/Given 04/10/20 1223)  iohexol (OMNIPAQUE) 350 MG/ML injection 60 mL (60 mLs Intravenous Contrast Given 04/10/20 1346)    ED Course  I have reviewed the triage vital signs and the nursing notes.  Pertinent labs & imaging results that were available during my care of the patient were reviewed by me and considered in my medical decision making (see chart for details).  Clinical Course as of Apr 10 1537  Thu Apr 10, 2020  1114 Patient not in room   [BM]    Clinical Course User Index [BM] Gari Crown   MDM Rules/Calculators/A&P                         Additional history obtained from: 1. Nursing notes from this visit. 2. Review of electronic medical records. ---------------------------- 74 year old male who has had 3 Covid vaccines presents today in his fifth day of illness Covid positive.  He has fatigue, shortness of breath with cough and some scant hemoptysis that occurred this morning.  He denies any associated chest pain or pleurisy.  Chest x-ray and basic labs were obtained in triage.  CBC shows mild leukopenia which is consistent with Covid, no leukocytosis to suggest bacterial infection.  Hemoglobin of 11.8 appears near baseline.  BMP shows glucose 118, otherwise normal limits, no emergent electrolyte derangement, AKI or gap.  Chest  x-ray showed patchy densities and recommended CT scan.  On my initial evaluation patient well-appearing no acute distress vital signs stable on room air without hypoxia tachycardia or hypotension.  Will obtain CT angio for evaluation of PE given hemoptysis and Covid positivity.  BNP added. - BNP within normal limits, doubt CHF exacerbation.  CT Angio PE Study:  IMPRESSION:  1. No evidence of a pulmonary embolism.  2. Bilateral ground-glass lung opacities consistent with multifocal  pneumonia and compatible with COVID-19 infection.    Aortic Atherosclerosis (ICD10-I70.0).  - Patient was ambulated by nursing staff without hypoxia on room air.  On reassessment he is well-appearing no acute distress and requesting discharge.  Suspect patient's cough and shortness of breath is secondary to COVID-19 viral infection.  He had some scant hemoptysis without large amounts of bleeding no indication for further work-up at this time.  Patient was going to go to the monoclonal antibody infusion center today with his wife but has missed that appointment I have encouraged him to go back for the infusion and I have reached out to the map center to schedule the patient.  He has a home pulse oximeter and I advised patient to monitor his oxygen level closely and if it drops below 90% to return to the ER immediately and he stated understanding.  Low suspicion for ACS, dissection, PE, bacterial pneumonia or other emergent pathologies requiring further ED work-up at this time he appears stable for discharge and outpatient treatment.  At this time there does not appear to be any evidence of an acute emergency medical condition and the patient appears stable for discharge with appropriate outpatient follow up. Diagnosis was  discussed with patient who verbalizes understanding of care plan and is agreeable to discharge. I have discussed return precautions with patient who verbalizes understanding. Patient encouraged to  follow-up with their PCP and infusion center. All questions answered.  Patient's case discussed with Dr. Regenia Skeeter who agrees with plan to discharge with follow-up.   Kenneth Poole was evaluated in Emergency Department on 04/10/2020 for the symptoms described in the history of present illness. He was evaluated in the context of the global COVID-19 pandemic, which necessitated consideration that the patient might be at risk for infection with the SARS-CoV-2 virus that causes COVID-19. Institutional protocols and algorithms that pertain to the evaluation of patients at risk for COVID-19 are in a state of rapid change based on information released by regulatory bodies including the CDC and federal and state organizations. These policies and algorithms were followed during the patient's care in the ED.  Note: Portions of this report may have been transcribed using voice recognition software. Every effort was made to ensure accuracy; however, inadvertent computerized transcription errors may still be present. Final Clinical Impression(s) / ED Diagnoses Final diagnoses:  COVID-19 virus infection    Rx / DC Orders ED Discharge Orders    None       Gari Crown 04/10/20 1600    Sherwood Gambler, MD 04/12/20 [phone]

## 2020-04-11 ENCOUNTER — Ambulatory Visit (HOSPITAL_COMMUNITY)
Admission: RE | Admit: 2020-04-11 | Discharge: 2020-04-11 | Disposition: A | Discharge status: Home / Self Care | Payer: Medicare Other | Source: Ambulatory Visit | Attending: Pulmonary Disease | Admitting: Pulmonary Disease

## 2020-04-11 ENCOUNTER — Other Ambulatory Visit (HOSPITAL_COMMUNITY): Payer: Self-pay

## 2020-04-11 DIAGNOSIS — U071 COVID-19: Secondary | ICD-10-CM | POA: Diagnosis not present

## 2020-04-11 MED ORDER — ALBUTEROL SULFATE HFA 108 (90 BASE) MCG/ACT IN AERS: 2.0000 | INHALATION_SPRAY | Freq: Once | RESPIRATORY_TRACT | Status: DC | PRN

## 2020-04-11 MED ORDER — FAMOTIDINE IN NACL 20-0.9 MG/50ML-% IV SOLN: 20.0000 mg | Freq: Once | INTRAVENOUS | Status: DC | PRN

## 2020-04-11 MED ORDER — EPINEPHRINE 0.3 MG/0.3ML IJ SOAJ: 0.3000 mg | Freq: Once | INTRAMUSCULAR | Status: DC | PRN

## 2020-04-11 MED ORDER — DIPHENHYDRAMINE HCL 50 MG/ML IJ SOLN: 50.0000 mg | Freq: Once | INTRAMUSCULAR | Status: DC | PRN

## 2020-04-11 MED ORDER — SOTROVIMAB 500 MG/8ML IV SOLN
500.0000 mg | Freq: Once | INTRAVENOUS | Status: AC
  Administered 2020-04-11: 500 mg via INTRAVENOUS

## 2020-04-11 MED ORDER — METHYLPREDNISOLONE SODIUM SUCC 125 MG IJ SOLR: 125.0000 mg | Freq: Once | INTRAMUSCULAR | Status: DC | PRN

## 2020-04-11 MED ORDER — SODIUM CHLORIDE 0.9 % IV SOLN: INTRAVENOUS | Status: DC | PRN

## 2020-04-11 NOTE — Progress Notes (Signed)
Patient ID: Kenneth Poole, male   DOB: Jun 14, 1945, 74 y.o.   MRN: [id number]   Sotrovimab administered via IV.  Patient provided with medication fact sheet prior to infusion and questions answered.  Patient provided with discharge instructions at discharge and all questions answered.  Diagnosis: COVID-19  Physician: Dr. Asencion Noble  Procedure: Covid Infusion Clinic Med: Sotrovimab infusion - Provided patient with sotrovimab fact sheet for patients, parents, and caregivers prior to infusion.   Complications: No immediate complications noted  Discharge: Discharged home  If after the infusion you have any questions or concerns please call the Advanced Practice Provider at [phone]

## 2020-04-11 NOTE — Discharge Instructions (Signed)
If you have any questions or concerns after the infusion please call the Advanced Practice Provider on call at [phone] you have any questions or concerns after the infusion please call the Advanced Practice Provider on call at [phone] What types of side effects do monoclonal antibody drugs cause?  Common side effects  In general, the more common side effects caused by monoclonal antibody drugs include: . Allergic reactions, such as hives or itching . Flu-like signs and symptoms, including chills, fatigue, fever, and muscle aches and pains . Nausea, vomiting . Diarrhea . Skin rashes . Low blood pressure   The CDC is recommending patients who receive monoclonal antibody treatments wait at least 90 days before being vaccinated.  Currently, there are no data on the safety and efficacy of mRNA COVID-19 vaccines in persons who received monoclonal antibodies or convalescent plasma as part of COVID-19 treatment. Based on the estimated half-life of such therapies as well as evidence suggesting that reinfection is uncommon in the 90 days after initial infection, vaccination should be deferred for at least 90 days, as a precautionary measure until additional information becomes available, to avoid interference of the antibody treatment with vaccine-induced immune responses.  .10 Things You Can Do to Manage Your COVID-19 Symptoms at Home If you have possible or confirmed COVID-19: 1. Stay home from work and school. And stay away from other public places. If you must go out, avoid using any kind of public transportation, ridesharing, or taxis. 2. Monitor your symptoms carefully. If your symptoms get worse, call your healthcare provider immediately. 3. Get rest and stay hydrated. 4. If you have a medical appointment, call the healthcare provider ahead of time and tell them that you have or may have COVID-19. 5. For medical emergencies, call 911 and notify the dispatch personnel that you have or  may have COVID-19. 6. Cover your cough and sneezes with a tissue or use the inside of your elbow. 7. Wash your hands often with soap and water for at least 20 seconds or clean your hands with an alcohol-based hand sanitizer that contains at least 60% alcohol. 8. As much as possible, stay in a specific room and away from other people in your home. Also, you should use a separate bathroom, if available. If you need to be around other people in or outside of the home, wear a mask. 9. Avoid sharing personal items with other people in your household, like dishes, towels, and bedding. 10. Clean all surfaces that are touched often, like counters, tabletops, and doorknobs. Use household cleaning sprays or wipes according to the label instructions. [email or web address] 11/22/2018 This information is not intended to replace advice given to you by your health care provider. Make sure you discuss any questions you have with your health care provider. Document Revised: 04/26/2019 Document Reviewed: 04/26/2019 Elsevier Patient Education  [place].

## 2020-04-11 NOTE — Progress Notes (Signed)
Patient reviewed Fact Sheet for Patients, Parents, and Caregivers for Emergency Use Authorization (EUA) of Sotrovimab for the Treatment of Coronavirus. Patient also reviewed and is agreeable to the estimated cost of treatment. Patient is agreeable to proceed.   

## 2020-04-19 ENCOUNTER — Other Ambulatory Visit: Payer: Self-pay | Admitting: Cardiology

## 2020-04-21 ENCOUNTER — Other Ambulatory Visit: Payer: Self-pay | Admitting: Cardiology

## 2020-04-23 ENCOUNTER — Other Ambulatory Visit: Payer: Self-pay

## 2020-04-23 MED ORDER — METOPROLOL TARTRATE 25 MG PO TABS
12.5000 mg | ORAL_TABLET | Freq: Two times a day (BID) | ORAL | 3 refills | Status: DC
Start: 2020-04-23 — End: 2021-04-02

## 2020-05-19 ENCOUNTER — Other Ambulatory Visit: Payer: Self-pay | Admitting: Cardiology

## 2020-05-23 ENCOUNTER — Other Ambulatory Visit: Payer: Self-pay | Admitting: Cardiology

## 2020-06-05 ENCOUNTER — Telehealth: Payer: Self-pay | Admitting: *Deleted

## 2020-06-05 NOTE — Telephone Encounter (Signed)
   [facility] Pre-operative Risk Assessment    HEARTCARE STAFF: - Please ensure there is not already an duplicate clearance open for this procedure. - Under Visit Info/Reason for Call, type in Other and utilize the format Clearance MM/DD/YY or Clearance TBD. Do not use dashes or single digits. - If request is for dental extraction, please clarify the # of teeth to be extracted.  Request for surgical clearance:  1. What type of surgery is being performed? L3-4 LUMBAR LAMINECTOMY   2. When is this surgery scheduled? 06/13/20   3. What type of clearance is required (medical clearance vs. Pharmacy clearance to hold med vs. Both)? MEDICAL  4. Are there any medications that need to be held prior to surgery and how long? ASA    5. Practice name and name of physician performing surgery? [facility]; DR. Mallie Mussel POOL   6. What is the office phone number? [phone]   7.   What is the office fax number? [place]: VANESSA  8.   Anesthesia type (None, local, MAC, general) ? GENERAL   Julaine Hua 06/05/2020, 3:27 PM  _________________________________________________________________   (provider comments below)

## 2020-06-06 NOTE — Telephone Encounter (Signed)
   Primary Cardiologist: Fransico Him, MD  Chart reviewed as part of pre-operative protocol coverage. Patient was contacted 06/06/2020 in reference to pre-operative risk assessment for pending surgery as outlined below.  Kenneth Poole was last seen on 03/28/2020 by Dr. Radford Pax.  Since that day, Kenneth Poole has done well without chest pain or shortness of breath.  Therefore, based on ACC/AHA guidelines, the patient would be at acceptable risk for the planned procedure without further cardiovascular testing.   Patient has been holding aspirin since 06/04/2020, that is more than enough time prior to the surgery.  He will need to restart aspirin as soon as possible after the surgery at the discretion of the surgeon  The patient was advised that if he develops new symptoms prior to surgery to contact our office to arrange for a follow-up visit, and he verbalized understanding.  I will route this recommendation to the requesting party via Epic fax function and remove from pre-op pool. Please call with questions.  [name], [place] 06/06/2020, 5:04 PM

## 2020-06-10 DIAGNOSIS — Z1152 Encounter for screening for COVID-19: Secondary | ICD-10-CM | POA: Diagnosis not present

## 2020-06-13 DIAGNOSIS — M5116 Intervertebral disc disorders with radiculopathy, lumbar region: Secondary | ICD-10-CM | POA: Diagnosis not present

## 2020-06-13 DIAGNOSIS — M48062 Spinal stenosis, lumbar region with neurogenic claudication: Secondary | ICD-10-CM | POA: Diagnosis not present

## 2020-06-13 DIAGNOSIS — M48061 Spinal stenosis, lumbar region without neurogenic claudication: Secondary | ICD-10-CM | POA: Diagnosis not present

## 2020-07-07 ENCOUNTER — Other Ambulatory Visit: Payer: Self-pay | Admitting: Cardiology

## 2020-07-23 DIAGNOSIS — E785 Hyperlipidemia, unspecified: Secondary | ICD-10-CM | POA: Diagnosis not present

## 2020-07-23 DIAGNOSIS — D509 Iron deficiency anemia, unspecified: Secondary | ICD-10-CM | POA: Diagnosis not present

## 2020-07-23 DIAGNOSIS — K22719 Barrett's esophagus with dysplasia, unspecified: Secondary | ICD-10-CM | POA: Diagnosis not present

## 2020-07-23 DIAGNOSIS — R7301 Impaired fasting glucose: Secondary | ICD-10-CM | POA: Diagnosis not present

## 2020-07-23 DIAGNOSIS — I700 Atherosclerosis of aorta: Secondary | ICD-10-CM | POA: Diagnosis not present

## 2020-07-23 DIAGNOSIS — I25119 Atherosclerotic heart disease of native coronary artery with unspecified angina pectoris: Secondary | ICD-10-CM | POA: Diagnosis not present

## 2020-07-23 DIAGNOSIS — K760 Fatty (change of) liver, not elsewhere classified: Secondary | ICD-10-CM | POA: Diagnosis not present

## 2020-07-23 DIAGNOSIS — N400 Enlarged prostate without lower urinary tract symptoms: Secondary | ICD-10-CM | POA: Diagnosis not present

## 2020-07-23 DIAGNOSIS — I10 Essential (primary) hypertension: Secondary | ICD-10-CM | POA: Diagnosis not present

## 2020-07-23 DIAGNOSIS — Z125 Encounter for screening for malignant neoplasm of prostate: Secondary | ICD-10-CM | POA: Diagnosis not present

## 2020-07-23 DIAGNOSIS — L570 Actinic keratosis: Secondary | ICD-10-CM | POA: Diagnosis not present

## 2020-07-23 DIAGNOSIS — M5416 Radiculopathy, lumbar region: Secondary | ICD-10-CM | POA: Diagnosis not present

## 2020-08-01 DIAGNOSIS — Z0000 Encounter for general adult medical examination without abnormal findings: Secondary | ICD-10-CM | POA: Diagnosis not present

## 2020-08-01 DIAGNOSIS — Z1389 Encounter for screening for other disorder: Secondary | ICD-10-CM | POA: Diagnosis not present

## 2020-08-22 DIAGNOSIS — E039 Hypothyroidism, unspecified: Secondary | ICD-10-CM | POA: Diagnosis not present

## 2020-08-22 DIAGNOSIS — E785 Hyperlipidemia, unspecified: Secondary | ICD-10-CM | POA: Diagnosis not present

## 2020-08-22 DIAGNOSIS — K760 Fatty (change of) liver, not elsewhere classified: Secondary | ICD-10-CM | POA: Diagnosis not present

## 2020-08-22 DIAGNOSIS — D509 Iron deficiency anemia, unspecified: Secondary | ICD-10-CM | POA: Diagnosis not present

## 2020-08-22 DIAGNOSIS — I10 Essential (primary) hypertension: Secondary | ICD-10-CM | POA: Diagnosis not present

## 2020-08-22 DIAGNOSIS — I700 Atherosclerosis of aorta: Secondary | ICD-10-CM | POA: Diagnosis not present

## 2020-10-07 DIAGNOSIS — K219 Gastro-esophageal reflux disease without esophagitis: Secondary | ICD-10-CM | POA: Diagnosis not present

## 2020-10-07 DIAGNOSIS — K3189 Other diseases of stomach and duodenum: Secondary | ICD-10-CM | POA: Diagnosis not present

## 2020-10-07 DIAGNOSIS — Z86010 Personal history of colonic polyps: Secondary | ICD-10-CM | POA: Diagnosis not present

## 2020-10-07 DIAGNOSIS — K529 Noninfective gastroenteritis and colitis, unspecified: Secondary | ICD-10-CM | POA: Diagnosis not present

## 2020-11-21 DIAGNOSIS — I10 Essential (primary) hypertension: Secondary | ICD-10-CM | POA: Diagnosis not present

## 2020-11-21 DIAGNOSIS — D509 Iron deficiency anemia, unspecified: Secondary | ICD-10-CM | POA: Diagnosis not present

## 2020-11-21 DIAGNOSIS — G629 Polyneuropathy, unspecified: Secondary | ICD-10-CM | POA: Diagnosis not present

## 2020-11-21 DIAGNOSIS — E039 Hypothyroidism, unspecified: Secondary | ICD-10-CM | POA: Diagnosis not present

## 2021-02-11 ENCOUNTER — Other Ambulatory Visit (HOSPITAL_COMMUNITY): Payer: Self-pay | Admitting: Cardiology

## 2021-02-11 DIAGNOSIS — H25813 Combined forms of age-related cataract, bilateral: Secondary | ICD-10-CM | POA: Diagnosis not present

## 2021-02-11 DIAGNOSIS — H0102B Squamous blepharitis left eye, upper and lower eyelids: Secondary | ICD-10-CM | POA: Diagnosis not present

## 2021-02-11 DIAGNOSIS — H10023 Other mucopurulent conjunctivitis, bilateral: Secondary | ICD-10-CM | POA: Diagnosis not present

## 2021-02-11 DIAGNOSIS — H02834 Dermatochalasis of left upper eyelid: Secondary | ICD-10-CM | POA: Diagnosis not present

## 2021-02-11 DIAGNOSIS — H17822 Peripheral opacity of cornea, left eye: Secondary | ICD-10-CM | POA: Diagnosis not present

## 2021-02-11 DIAGNOSIS — H02831 Dermatochalasis of right upper eyelid: Secondary | ICD-10-CM | POA: Diagnosis not present

## 2021-02-11 DIAGNOSIS — I6523 Occlusion and stenosis of bilateral carotid arteries: Secondary | ICD-10-CM

## 2021-02-11 DIAGNOSIS — H04123 Dry eye syndrome of bilateral lacrimal glands: Secondary | ICD-10-CM | POA: Diagnosis not present

## 2021-02-11 DIAGNOSIS — H0102A Squamous blepharitis right eye, upper and lower eyelids: Secondary | ICD-10-CM | POA: Diagnosis not present

## 2021-02-12 ENCOUNTER — Ambulatory Visit (HOSPITAL_COMMUNITY)
Admission: RE | Admit: 2021-02-12 | Discharge: 2021-02-12 | Disposition: A | Discharge status: Home / Self Care | Payer: Medicare Other | Source: Ambulatory Visit | Attending: Cardiology | Admitting: Cardiology

## 2021-02-12 ENCOUNTER — Encounter: Payer: Self-pay | Admitting: Cardiology

## 2021-02-12 ENCOUNTER — Other Ambulatory Visit (HOSPITAL_COMMUNITY): Payer: Self-pay | Admitting: Cardiology

## 2021-02-12 ENCOUNTER — Other Ambulatory Visit: Payer: Self-pay

## 2021-02-12 DIAGNOSIS — I6523 Occlusion and stenosis of bilateral carotid arteries: Secondary | ICD-10-CM

## 2021-02-13 ENCOUNTER — Telehealth: Payer: Self-pay | Admitting: Nurse Practitioner

## 2021-02-13 DIAGNOSIS — I6523 Occlusion and stenosis of bilateral carotid arteries: Secondary | ICD-10-CM

## 2021-02-13 NOTE — Telephone Encounter (Signed)
-----   Message from Sueanne Margarita, MD sent at 02/12/2021 10:06 PM EDT ----- 1-39% bilateral carotid stenosis and < 50% right common carotid artery stenosis.  Repeat in 2 years

## 2021-02-13 NOTE — Telephone Encounter (Signed)
Results and plan of care to repeat study in 2 years reviewed with patient who verbalized understanding and agreement. Unable to place order due to > 1 year but have placed 2 year recall.

## 2021-03-29 DIAGNOSIS — R059 Cough, unspecified: Secondary | ICD-10-CM | POA: Diagnosis not present

## 2021-03-29 DIAGNOSIS — R0981 Nasal congestion: Secondary | ICD-10-CM | POA: Diagnosis not present

## 2021-03-29 DIAGNOSIS — J329 Chronic sinusitis, unspecified: Secondary | ICD-10-CM | POA: Diagnosis not present

## 2021-04-02 ENCOUNTER — Ambulatory Visit (INDEPENDENT_AMBULATORY_CARE_PROVIDER_SITE_OTHER): Payer: Medicare Other | Admitting: Cardiology

## 2021-04-02 ENCOUNTER — Encounter: Payer: Self-pay | Admitting: Cardiology

## 2021-04-02 ENCOUNTER — Other Ambulatory Visit: Payer: Self-pay

## 2021-04-02 VITALS — BP 122/74 | HR 67 | Ht 69.0 in | Wt 202.8 lb

## 2021-04-02 DIAGNOSIS — I10 Essential (primary) hypertension: Secondary | ICD-10-CM | POA: Diagnosis not present

## 2021-04-02 DIAGNOSIS — I2510 Atherosclerotic heart disease of native coronary artery without angina pectoris: Secondary | ICD-10-CM | POA: Diagnosis not present

## 2021-04-02 DIAGNOSIS — I6523 Occlusion and stenosis of bilateral carotid arteries: Secondary | ICD-10-CM

## 2021-04-02 DIAGNOSIS — E7800 Pure hypercholesterolemia, unspecified: Secondary | ICD-10-CM

## 2021-04-02 LAB — COMPREHENSIVE METABOLIC PANEL
ALT: 37 IU/L (ref 0–44)
AST: 36 IU/L (ref 0–40)
Albumin/Globulin Ratio: 1.9 (ref 1.2–2.2)
Albumin: 4.6 g/dL (ref 3.7–4.7)
Alkaline Phosphatase: 89 IU/L (ref 44–121)
BUN/Creatinine Ratio: 13 (ref 10–24)
BUN: 10 mg/dL (ref 8–27)
Bilirubin Total: 0.7 mg/dL (ref 0.0–1.2)
CO2: 25 mmol/L (ref 20–29)
Calcium: 9.8 mg/dL (ref 8.6–10.2)
Chloride: 103 mmol/L (ref 96–106)
Creatinine, Ser: 0.77 mg/dL (ref 0.76–1.27)
Globulin, Total: 2.4 g/dL (ref 1.5–4.5)
Glucose: 109 mg/dL — ABNORMAL HIGH (ref 70–99)
Potassium: 5 mmol/L (ref 3.5–5.2)
Sodium: 141 mmol/L (ref 134–144)
Total Protein: 7 g/dL (ref 6.0–8.5)
eGFR: 95 mL/min/{1.73_m2} (ref >59)

## 2021-04-02 LAB — LIPID PANEL
Chol/HDL Ratio: 2.6 ratio (ref 0.0–5.0)
Cholesterol, Total: 97 mg/dL — ABNORMAL LOW (ref 100–199)
HDL: 38 mg/dL — ABNORMAL LOW (ref >39)
LDL Chol Calc (NIH): 29 mg/dL (ref 0–99)
Triglycerides: 185 mg/dL — ABNORMAL HIGH (ref 0–149)
VLDL Cholesterol Cal: 30 mg/dL (ref 5–40)

## 2021-04-02 LAB — MAGNESIUM: Magnesium: 1.9 mg/dL (ref 1.6–2.3)

## 2021-04-02 MED ORDER — DOXAZOSIN MESYLATE 4 MG PO TABS
4.0000 mg | ORAL_TABLET | Freq: Every day | ORAL | 3 refills | Status: DC
Start: 2021-04-02 — End: 2022-03-29

## 2021-04-02 MED ORDER — METOPROLOL TARTRATE 25 MG PO TABS
12.5000 mg | ORAL_TABLET | Freq: Two times a day (BID) | ORAL | 3 refills | Status: DC
Start: 2021-04-02 — End: 2022-04-07

## 2021-04-02 MED ORDER — REPATHA SURECLICK 140 MG/ML ~~LOC~~ SOAJ: SUBCUTANEOUS | 11 refills | Status: DC

## 2021-04-02 MED ORDER — HYDROCHLOROTHIAZIDE 25 MG PO TABS
25.0000 mg | ORAL_TABLET | Freq: Every day | ORAL | 3 refills | Status: DC
Start: 2021-04-02 — End: 2022-06-07

## 2021-04-02 MED ORDER — EZETIMIBE 10 MG PO TABS
10.0000 mg | ORAL_TABLET | Freq: Every day | ORAL | 3 refills | Status: DC
Start: 2021-04-02 — End: 2022-04-26

## 2021-04-02 MED ORDER — LISINOPRIL 20 MG PO TABS
20.0000 mg | ORAL_TABLET | Freq: Every day | ORAL | 3 refills | Status: DC
Start: 2021-04-02 — End: 2022-06-22

## 2021-04-02 MED ORDER — POTASSIUM CHLORIDE CRYS ER 20 MEQ PO TBCR
20.0000 meq | EXTENDED_RELEASE_TABLET | Freq: Every day | ORAL | 3 refills | Status: DC
Start: 2021-04-02 — End: 2022-05-19

## 2021-04-02 NOTE — Progress Notes (Signed)
Cardiology Office Note:    Date:  04/02/2021   ID:  DVID PENDRY, DOB 06-29-1945, MRN [id number]  PCP:  Kristen Loader, FNP  Cardiologist:  Fransico Him, MD    Referring MD: Hulan Fess, MD   Chief Complaint  Patient presents with   Coronary Artery Disease   Hypertension   Hyperlipidemia    History of Present Illness:    Kenneth Poole is a 75 y.o. male with a hx of ASCAD s/p PTCA of circumflex lesion in 2004 and cath in June 2009 with 50% LAD.  He also has HTN and dyslipidemia.  Nuclear stress test for CP 01/2017 showed no ischemia.    He is here today for followup and is doing well.  HE denies any chest pain or pressure, SOB, DOE (except working out in the yard with his allergies), PND, orthopnea, LE edema, dizziness, palpitations or syncope. He is compliant with his meds and is tolerating meds with no SE.     Past Medical History:  Diagnosis Date   Back pain    Carotid artery disease (HCC)    1-39% bilateral carotid stenosis and < 50% right common carotid artery stenosis by dopplers 01/2021   Coronary artery disease    s.p PTCA of circumflex lesion in 2004, cardiologist Dr Radford Pax, nonobstructive CAD on CATH in June 2009 with 50% LAD   Dermatitis    Diverticulosis    Dyslipidemia    GERD (gastroesophageal reflux disease)    Hypercholesteremia    Hypercholesteremia    Hypertension    Obesity    Peripheral neuropathy    Psoriasis    Urticaria     Past Surgical History:  Procedure Laterality Date   APPENDECTOMY     BACK SURGERY     CARDIAC CATHETERIZATION  June 2009   CHOLECYSTECTOMY     CORONARY STENT PLACEMENT     KIDNEY STONE SURGERY     TONSILLECTOMY      Current Medications: Current Meds  Medication Sig   albuterol (VENTOLIN HFA) 108 (90 Base) MCG/ACT inhaler Inhale 2 puffs into the lungs every 6 (six) hours as needed for wheezing or shortness of breath.   amoxicillin-clavulanate (AUGMENTIN) 875-125 MG tablet Take 1 tablet by mouth every 12  (twelve) hours.   aspirin 81 MG tablet Take 81 mg by mouth daily.   benzonatate (TESSALON) 100 MG capsule Take 1 capsule (100 mg total) by mouth every 8 (eight) hours.   colestipol (COLESTID) 1 g tablet Take 1 g by mouth at bedtime.    doxazosin (CARDURA) 4 MG tablet Take 4 mg by mouth daily.    EPINEPHrine (EPIPEN IJ) Inject as directed as needed (Anaphylaxis).    ezetimibe (ZETIA) 10 MG tablet Take 1 tablet by mouth once daily   Ferrous Sulfate (IRON) 325 (65 Fe) MG TABS Take by mouth.   fish oil-omega-3 fatty acids 1000 MG capsule Take 2 g by mouth 2 (two) times daily.    gabapentin (NEURONTIN) 300 MG capsule Take 300 mg by mouth daily.   hydrochlorothiazide (HYDRODIURIL) 25 MG tablet Take 25 mg by mouth daily.   levothyroxine (SYNTHROID) 50 MCG tablet Take 50 mcg by mouth daily before breakfast.   lisinopril (ZESTRIL) 20 MG tablet Take 1 tablet (20 mg total) by mouth daily.   metoprolol tartrate (LOPRESSOR) 25 MG tablet Take 0.5 tablets (12.5 mg total) by mouth 2 (two) times daily.   Multiple Vitamin (MULTIVITAMIN) capsule Take 1 capsule by mouth daily.   nitroGLYCERIN (  NITROSTAT) 0.4 MG SL tablet Place 1 tablet (0.4 mg total) under the tongue every 5 (five) minutes as needed for chest pain.   omeprazole (PRILOSEC) 40 MG capsule Take 40 mg by mouth 2 (two) times daily.   potassium chloride SA (KLOR-CON) 20 MEQ tablet Take 1 tablet by mouth once daily   REPATHA SURECLICK 998 MG/ML SOAJ INJECT ONE PEN INTO THE SKIN EVERY 14 DAYS.     Allergies:   Apple cider vinegar, Bee pollen, Erythromycin base, Other, Rosuvastatin calcium, Sulfa antibiotics, Tetracycline, Ace inhibitors, Bee venom, Fenofibrate, and Simcor [niacin-simvastatin er]   Social History   Socioeconomic History   Marital status: Married    Spouse name: Not on file   Number of children: Not on file   Years of education: Not on file   Highest education level: Not on file  Occupational History   Not on file  Tobacco Use    Smoking status: Former    Types: Cigarettes    Quit date: 05/24/1989    Years since quitting: 31.8   Smokeless tobacco: Never  Vaping Use   Vaping Use: Never used  Substance and Sexual Activity   Alcohol use: No   Drug use: No   Sexual activity: Not on file  Other Topics Concern   Not on file  Social History Narrative   Not on file   Social Determinants of Health   Financial Resource Strain: Not on file  Food Insecurity: Not on file  Transportation Needs: Not on file  Physical Activity: Not on file  Stress: Not on file  Social Connections: Not on file     Family History: The patient's family history includes Alzheimer's disease in his mother; Heart attack in his father.  ROS:   Please see the history of present illness.    ROS  All other systems reviewed and negative.   EKGs/Labs/Other Studies Reviewed:    The following studies were reviewed today: EKG  EKG:  EKG is  ordered today.  The ekg ordered today demonstrates NSR with no ST changes  Recent Labs: 04/10/2020: B Natriuretic Peptide 77.3; BUN 10; Creatinine, Ser 0.76; Hemoglobin 11.8; Platelets 172; Potassium 3.5; Sodium 140   Recent Lipid Panel    Component Value Date/Time   CHOL 90 (L) 03/28/2020 1005   TRIG 151 (H) 03/28/2020 1005   HDL 43 03/28/2020 1005   CHOLHDL 2.1 03/28/2020 1005   CHOLHDL 5.9 (H) 11/27/2015 0801   VLDL 38 (H) 11/27/2015 0801   LDLCALC 22 03/28/2020 1005   LDLDIRECT 146 (H) 04/09/2015 0802    Physical Exam:    VS:  BP 122/74   Pulse 67   Ht 5\' 9"  (1.753 m)   Wt 202 lb 12.8 oz (92 kg)   SpO2 94%   BMI 29.95 kg/m     Wt Readings from Last 3 Encounters:  04/02/21 202 lb 12.8 oz (92 kg)  04/10/20 203 lb (92.1 kg)  03/28/20 203 lb 6.4 oz (92.3 kg)     GEN: Well nourished, well developed in no acute distress HEENT: Normal NECK: No JVD; No carotid bruits LYMPHATICS: No lymphadenopathy CARDIAC:RRR, no murmurs, rubs, gallops RESPIRATORY:  Clear to auscultation without  rales, wheezing or rhonchi  ABDOMEN: Soft, non-tender, non-distended MUSCULOSKELETAL:  No edema; No deformity  SKIN: Warm and dry NEUROLOGIC:  Alert and oriented x 3 PSYCHIATRIC:  Normal affect   ASSESSMENT:    1. Coronary artery disease involving native coronary artery of native heart without angina pectoris  2. Primary hypertension   3. Bilateral carotid artery stenosis   4. Hypercholesteremia     PLAN:    In order of problems listed above:    1.  ASCAD -s/p PTCA of circumflex lesion in 2004 and cath in June 2009 with 50% LAD.   -Nuclear stress test 01/2017 showed no ischemia.  -He has not had any anginal symptoms since I saw him last -Continue prescription drug management with aspirin 81 mg daily, Lopressor 12.5 mg twice daily and Repatha with as needed refills    2.  HTN  -His BP is adequately controlled on exam today -We will continue prescription drug management with doxazosin 4 mg daily, HCTZ 25 mg daily, lisinopril 20 mg daily, Lopressor 12.5 mg twice daily with as needed refills  -I have personally reviewed and interpreted outside labs performed by patient's PCP which showed serum creatinine 0.71, potassium 4.9 on 07/23/2020 -Check bmet today   3.  Bilateral carotid artery stenosis  -Carotid dopplers 1-39% by dopplers 12/2018.   -Carotid Dopplers 02/12/2021 showed 1-39% bilateral carotid stenosis and < 50% right common carotid artery stenosis -Repeat carotid Dopplers in 2024 -continue statin and ASA.    4.  Hyperlipidemia  -LDL goal < 70.  -cI have personally reviewed and interpreted outside labs performed by patient's PCP which showed  LDL 17, HDL 42, triglycerides 205, ALT 27 in March 2022 -Repeat FLP and ALT -He will continue prescription drug management with Zetia 10 mg daily and Repatha refills  -He is statin intolerant  Medication Adjustments/Labs and Tests Ordered: Current medicines are reviewed at length with the patient today.  Concerns regarding medicines  are outlined above.  Orders Placed This Encounter  Procedures   EKG 12-Lead    No orders of the defined types were placed in this encounter.   Signed, Fransico Him, MD  04/02/2021 8:33 AM    [place]

## 2021-04-02 NOTE — Addendum Note (Signed)
Addended by: Antonieta Iba on: 04/02/2021 08:58 AM   Modules accepted: Orders

## 2021-04-02 NOTE — Patient Instructions (Signed)
Medication Instructions:  Your physician recommends that you continue on your current medications as directed. Please refer to the Current Medication list given to you today.  *If you need a refill on your cardiac medications before your next appointment, please call your pharmacy*   Lab Work: TODAY: Lipids, Magnesium, CMET If you have labs (blood work) drawn today and your tests are completely normal, you will receive your results only by: [place] (if you have MyChart) OR A paper copy in the mail If you have any lab test that is abnormal or we need to change your treatment, we will call you to review the results.  Follow-Up: At [facility], you and your health needs are our priority.  As part of our continuing mission to provide you with exceptional heart care, we have created designated Provider Care Teams.  These Care Teams include your primary Cardiologist (physician) and Advanced Practice Providers (APPs -  Physician Assistants and Nurse Practitioners) who all work together to provide you with the care you need, when you need it.  Your next appointment:   1 year(s)  The format for your next appointment:   In Person  Provider:   Fransico Him, MD

## 2021-04-07 ENCOUNTER — Telehealth: Payer: Self-pay

## 2021-04-07 MED ORDER — ICOSAPENT ETHYL 1 G PO CAPS: 2.0000 g | ORAL_CAPSULE | Freq: Two times a day (BID) | ORAL | 3 refills | Status: DC

## 2021-04-07 NOTE — Telephone Encounter (Signed)
The patient has been notified of the result and verbalized understanding.  All questions (if any) were answered. Antonieta Iba, RN 04/07/2021 3:25 PM

## 2021-04-07 NOTE — Telephone Encounter (Signed)
-----   Message from Rollen Sox, [place] sent at 04/07/2021 12:54 PM EST ----- LDL well controlled.  Triglycerides elevated. Recommend starting Vascepa 2g BID and avoiding processed foods, sugars, and alcohol ----- Message ----- From: Antonieta Iba, RN Sent: 04/06/2021  12:21 PM EST To: [facility] Pharmd  Dr. Radford Pax would like recommendations from PharmD.

## 2021-04-18 ENCOUNTER — Other Ambulatory Visit: Payer: Self-pay | Admitting: Cardiology

## 2021-05-05 DIAGNOSIS — Z23 Encounter for immunization: Secondary | ICD-10-CM | POA: Diagnosis not present

## 2021-05-08 DIAGNOSIS — E039 Hypothyroidism, unspecified: Secondary | ICD-10-CM | POA: Diagnosis not present

## 2021-05-08 DIAGNOSIS — I10 Essential (primary) hypertension: Secondary | ICD-10-CM | POA: Diagnosis not present

## 2021-06-08 ENCOUNTER — Other Ambulatory Visit: Payer: Self-pay | Admitting: Cardiology

## 2021-07-18 DIAGNOSIS — U071 COVID-19: Secondary | ICD-10-CM | POA: Diagnosis not present

## 2021-08-06 ENCOUNTER — Encounter: Payer: Self-pay | Admitting: Cardiology

## 2021-08-06 DIAGNOSIS — I700 Atherosclerosis of aorta: Secondary | ICD-10-CM | POA: Diagnosis not present

## 2021-08-06 DIAGNOSIS — N400 Enlarged prostate without lower urinary tract symptoms: Secondary | ICD-10-CM | POA: Diagnosis not present

## 2021-08-06 DIAGNOSIS — E039 Hypothyroidism, unspecified: Secondary | ICD-10-CM | POA: Diagnosis not present

## 2021-08-06 DIAGNOSIS — D509 Iron deficiency anemia, unspecified: Secondary | ICD-10-CM | POA: Diagnosis not present

## 2021-08-06 DIAGNOSIS — Z125 Encounter for screening for malignant neoplasm of prostate: Secondary | ICD-10-CM | POA: Diagnosis not present

## 2021-08-06 DIAGNOSIS — R7301 Impaired fasting glucose: Secondary | ICD-10-CM | POA: Diagnosis not present

## 2021-08-06 DIAGNOSIS — G629 Polyneuropathy, unspecified: Secondary | ICD-10-CM | POA: Diagnosis not present

## 2021-08-06 DIAGNOSIS — I10 Essential (primary) hypertension: Secondary | ICD-10-CM | POA: Diagnosis not present

## 2021-08-06 DIAGNOSIS — Z0000 Encounter for general adult medical examination without abnormal findings: Secondary | ICD-10-CM | POA: Diagnosis not present

## 2021-08-06 DIAGNOSIS — Z1389 Encounter for screening for other disorder: Secondary | ICD-10-CM | POA: Diagnosis not present

## 2021-08-06 DIAGNOSIS — E785 Hyperlipidemia, unspecified: Secondary | ICD-10-CM | POA: Diagnosis not present

## 2021-11-18 DIAGNOSIS — L820 Inflamed seborrheic keratosis: Secondary | ICD-10-CM | POA: Diagnosis not present

## 2021-11-18 DIAGNOSIS — L570 Actinic keratosis: Secondary | ICD-10-CM | POA: Diagnosis not present

## 2021-11-18 DIAGNOSIS — L400 Psoriasis vulgaris: Secondary | ICD-10-CM | POA: Diagnosis not present

## 2021-11-18 DIAGNOSIS — X32XXXD Exposure to sunlight, subsequent encounter: Secondary | ICD-10-CM | POA: Diagnosis not present

## 2021-12-28 ENCOUNTER — Other Ambulatory Visit: Payer: Self-pay | Admitting: *Deleted

## 2022-02-12 ENCOUNTER — Ambulatory Visit (HOSPITAL_COMMUNITY)
Admission: RE | Admit: 2022-02-12 | Discharge: 2022-02-12 | Disposition: A | Discharge status: Home / Self Care | Payer: Medicare Other | Source: Ambulatory Visit | Attending: Cardiology | Admitting: Cardiology

## 2022-02-12 DIAGNOSIS — I6523 Occlusion and stenosis of bilateral carotid arteries: Secondary | ICD-10-CM | POA: Diagnosis not present

## 2022-02-14 ENCOUNTER — Encounter: Payer: Self-pay | Admitting: Cardiology

## 2022-02-16 DIAGNOSIS — H02834 Dermatochalasis of left upper eyelid: Secondary | ICD-10-CM | POA: Diagnosis not present

## 2022-02-16 DIAGNOSIS — H25813 Combined forms of age-related cataract, bilateral: Secondary | ICD-10-CM | POA: Diagnosis not present

## 2022-02-16 DIAGNOSIS — H0102A Squamous blepharitis right eye, upper and lower eyelids: Secondary | ICD-10-CM | POA: Diagnosis not present

## 2022-02-16 DIAGNOSIS — H17822 Peripheral opacity of cornea, left eye: Secondary | ICD-10-CM | POA: Diagnosis not present

## 2022-02-16 DIAGNOSIS — H0102B Squamous blepharitis left eye, upper and lower eyelids: Secondary | ICD-10-CM | POA: Diagnosis not present

## 2022-02-16 DIAGNOSIS — H02831 Dermatochalasis of right upper eyelid: Secondary | ICD-10-CM | POA: Diagnosis not present

## 2022-02-16 DIAGNOSIS — H04123 Dry eye syndrome of bilateral lacrimal glands: Secondary | ICD-10-CM | POA: Diagnosis not present

## 2022-02-18 DIAGNOSIS — K3189 Other diseases of stomach and duodenum: Secondary | ICD-10-CM | POA: Diagnosis not present

## 2022-02-18 DIAGNOSIS — D649 Anemia, unspecified: Secondary | ICD-10-CM | POA: Diagnosis not present

## 2022-02-18 DIAGNOSIS — R1013 Epigastric pain: Secondary | ICD-10-CM | POA: Diagnosis not present

## 2022-02-18 DIAGNOSIS — K219 Gastro-esophageal reflux disease without esophagitis: Secondary | ICD-10-CM | POA: Diagnosis not present

## 2022-02-18 DIAGNOSIS — Z86010 Personal history of colonic polyps: Secondary | ICD-10-CM | POA: Diagnosis not present

## 2022-03-09 ENCOUNTER — Other Ambulatory Visit: Payer: Self-pay | Admitting: Cardiology

## 2022-03-15 DIAGNOSIS — D649 Anemia, unspecified: Secondary | ICD-10-CM | POA: Diagnosis not present

## 2022-03-17 ENCOUNTER — Telehealth: Payer: Self-pay | Admitting: Pharmacist

## 2022-03-17 NOTE — Telephone Encounter (Signed)
PA for Repatha submitted: [facility]

## 2022-03-22 NOTE — Telephone Encounter (Signed)
PA approved through 03/23/23

## 2022-03-22 NOTE — Telephone Encounter (Signed)
Key: [id number] Clinical questions answered and submitted

## 2022-03-27 ENCOUNTER — Other Ambulatory Visit: Payer: Self-pay | Admitting: Cardiology

## 2022-04-07 ENCOUNTER — Other Ambulatory Visit: Payer: Self-pay | Admitting: Cardiology

## 2022-04-26 ENCOUNTER — Other Ambulatory Visit: Payer: Self-pay | Admitting: Cardiology

## 2022-04-26 DIAGNOSIS — D509 Iron deficiency anemia, unspecified: Secondary | ICD-10-CM | POA: Diagnosis not present

## 2022-04-30 DIAGNOSIS — Z6832 Body mass index (BMI) 32.0-32.9, adult: Secondary | ICD-10-CM | POA: Diagnosis not present

## 2022-04-30 DIAGNOSIS — U071 COVID-19: Secondary | ICD-10-CM | POA: Diagnosis not present

## 2022-05-10 ENCOUNTER — Other Ambulatory Visit: Payer: Self-pay | Admitting: Cardiology

## 2022-05-19 ENCOUNTER — Other Ambulatory Visit: Payer: Self-pay | Admitting: Cardiology

## 2022-06-04 ENCOUNTER — Other Ambulatory Visit: Payer: Self-pay | Admitting: Cardiology

## 2022-06-04 ENCOUNTER — Other Ambulatory Visit: Payer: Self-pay | Admitting: Gastroenterology

## 2022-06-04 DIAGNOSIS — Z86010 Personal history of colonic polyps: Secondary | ICD-10-CM | POA: Diagnosis not present

## 2022-06-04 DIAGNOSIS — R140 Abdominal distension (gaseous): Secondary | ICD-10-CM | POA: Diagnosis not present

## 2022-06-04 DIAGNOSIS — K3189 Other diseases of stomach and duodenum: Secondary | ICD-10-CM | POA: Diagnosis not present

## 2022-06-04 DIAGNOSIS — K439 Ventral hernia without obstruction or gangrene: Secondary | ICD-10-CM | POA: Diagnosis not present

## 2022-06-04 DIAGNOSIS — D649 Anemia, unspecified: Secondary | ICD-10-CM

## 2022-06-04 DIAGNOSIS — K219 Gastro-esophageal reflux disease without esophagitis: Secondary | ICD-10-CM | POA: Diagnosis not present

## 2022-06-15 ENCOUNTER — Ambulatory Visit
Admission: RE | Admit: 2022-06-15 | Discharge: 2022-06-15 | Disposition: A | Discharge status: Home / Self Care | Payer: Medicare Other | Source: Ambulatory Visit | Attending: Gastroenterology | Admitting: Gastroenterology

## 2022-06-15 DIAGNOSIS — R140 Abdominal distension (gaseous): Secondary | ICD-10-CM

## 2022-06-15 DIAGNOSIS — K439 Ventral hernia without obstruction or gangrene: Secondary | ICD-10-CM

## 2022-06-15 DIAGNOSIS — D649 Anemia, unspecified: Secondary | ICD-10-CM

## 2022-06-15 MED ORDER — IOPAMIDOL (ISOVUE-300) INJECTION 61%
100.0000 mL | Freq: Once | INTRAVENOUS | Status: AC | PRN
  Administered 2022-06-15: 100 mL via INTRAVENOUS

## 2022-06-22 ENCOUNTER — Encounter: Payer: Self-pay | Admitting: Podiatry

## 2022-06-22 ENCOUNTER — Ambulatory Visit (INDEPENDENT_AMBULATORY_CARE_PROVIDER_SITE_OTHER): Payer: Medicare Other | Admitting: Podiatry

## 2022-06-22 ENCOUNTER — Encounter: Payer: Self-pay | Admitting: Cardiology

## 2022-06-22 ENCOUNTER — Ambulatory Visit: Payer: Medicare Other | Attending: Cardiology | Admitting: Cardiology

## 2022-06-22 VITALS — BP 111/62

## 2022-06-22 VITALS — BP 128/72 | HR 65 | Ht 69.0 in | Wt 207.4 lb

## 2022-06-22 DIAGNOSIS — I2510 Atherosclerotic heart disease of native coronary artery without angina pectoris: Secondary | ICD-10-CM | POA: Insufficient documentation

## 2022-06-22 DIAGNOSIS — B351 Tinea unguium: Secondary | ICD-10-CM

## 2022-06-22 DIAGNOSIS — M79674 Pain in right toe(s): Secondary | ICD-10-CM

## 2022-06-22 DIAGNOSIS — I10 Essential (primary) hypertension: Secondary | ICD-10-CM | POA: Insufficient documentation

## 2022-06-22 DIAGNOSIS — I6523 Occlusion and stenosis of bilateral carotid arteries: Secondary | ICD-10-CM | POA: Insufficient documentation

## 2022-06-22 DIAGNOSIS — E7800 Pure hypercholesterolemia, unspecified: Secondary | ICD-10-CM | POA: Insufficient documentation

## 2022-06-22 DIAGNOSIS — M79675 Pain in left toe(s): Secondary | ICD-10-CM | POA: Diagnosis not present

## 2022-06-22 MED ORDER — ICOSAPENT ETHYL 1 G PO CAPS: 2.0000 g | ORAL_CAPSULE | Freq: Two times a day (BID) | ORAL | 3 refills | Status: DC

## 2022-06-22 MED ORDER — POTASSIUM CHLORIDE CRYS ER 20 MEQ PO TBCR: 20.0000 meq | EXTENDED_RELEASE_TABLET | Freq: Every day | ORAL | 3 refills | Status: DC

## 2022-06-22 MED ORDER — HYDROCHLOROTHIAZIDE 25 MG PO TABS: 25.0000 mg | ORAL_TABLET | Freq: Every day | ORAL | 3 refills | Status: DC

## 2022-06-22 MED ORDER — LISINOPRIL 20 MG PO TABS: 20.0000 mg | ORAL_TABLET | Freq: Every day | ORAL | 3 refills | Status: DC

## 2022-06-22 MED ORDER — DOXAZOSIN MESYLATE 4 MG PO TABS: 4.0000 mg | ORAL_TABLET | Freq: Every day | ORAL | 3 refills | Status: DC

## 2022-06-22 NOTE — Addendum Note (Signed)
Addended by: Sueanne Margarita on: 06/22/2022 10:48 AM   Modules accepted: Orders

## 2022-06-22 NOTE — Addendum Note (Signed)
Addended by: Bernestine Amass on: 06/22/2022 08:32 AM   Modules accepted: Orders

## 2022-06-22 NOTE — Addendum Note (Signed)
Addended by: Bernestine Amass on: 06/22/2022 10:43 AM   Modules accepted: Orders

## 2022-06-22 NOTE — Patient Instructions (Signed)

## 2022-06-22 NOTE — Patient Instructions (Addendum)
Medication Instructions:  Your physician recommends that you continue on your current medications as directed. Please refer to the Current Medication list given to you today.  *If you need a refill on your cardiac medications before your next appointment, please call your pharmacy*  Lab Work: TODAY: FLP and ALT If you have labs (blood work) drawn today and your tests are completely normal, you will receive your results only by: [place] (if you have MyChart) OR A paper copy in the mail If you have any lab test that is abnormal or we need to change your treatment, we will call you to review the results.   Testing/Procedures: Your physician has requested that you have a carotid duplex in September 2024. This test is an ultrasound of the carotid arteries in your neck. It looks at blood flow through these arteries that supply the brain with blood. Allow one hour for this exam. There are no restrictions or special instructions.  Your physician has requested that you have a PET/CT stress test. See instructions below.   Follow-Up: At [facility], you and your health needs are our priority.  As part of our continuing mission to provide you with exceptional heart care, we have created designated Provider Care Teams.  These Care Teams include your primary Cardiologist (physician) and Advanced Practice Providers (APPs -  Physician Assistants and Nurse Practitioners) who all work together to provide you with the care you need, when you need it.  Your next appointment:   1 year(s)  Provider:   Fransico Him, MD     Other Instructions How to Prepare for Your Cardiac PET/CT Stress Test:  1. Please do not take these medications before your test:   Medications that may interfere with the cardiac pharmacological stress agent (ex. nitrates - including erectile dysfunction medications, isosorbide mononitrate or beta-blockers) the day of the exam. (Erectile dysfunction medication should  be held for at least 72 hrs prior to test) - Metoprolol  Your remaining medications may be taken with water.  2. Nothing to eat or drink, except water, 3 hours prior to arrival time.   NO caffeine/decaffeinated products, or chocolate 12 hours prior to arrival.  3. NO perfume, cologne or lotion  4. Total time is 1 to 2 hours; you may want to bring reading material for the waiting time.  5. Please report to Admitting at the [place] Entrance 30 minutes early for your test.  [place], [place] [id number]    IF YOU THINK YOU MAY BE PREGNANT, OR ARE NURSING PLEASE INFORM THE TECHNOLOGIST.  In preparation for your appointment, medication and supplies will be purchased.  Appointment availability is limited, so if you need to cancel or reschedule, please call the Radiology Department at [phone]  24 hours in advance to avoid a cancellation fee of $100.00  What to Expect After you Arrive:  Once you arrive and check in for your appointment, you will be taken to a preparation room within the Radiology Department.  A technologist or Nurse will obtain your medical history, verify that you are correctly prepped for the exam, and explain the procedure.  Afterwards,  an IV will be started in your arm and electrodes will be placed on your skin for EKG monitoring during the stress portion of the exam. Then you will be escorted to the PET/CT scanner.  There, staff will get you positioned on the scanner and obtain a blood pressure and EKG.  During the  exam, you will continue to be connected to the EKG and blood pressure machines.  A small, safe amount of a radioactive tracer will be injected in your IV to obtain a series of pictures of your heart along with an injection of a stress agent.    After your Exam:  It is recommended that you eat a meal and drink a caffeinated beverage to counter act any effects of the stress agent.  Drink plenty of fluids for the remainder of the  day and urinate frequently for the first couple of hours after the exam.  Your doctor will inform you of your test results within 7-10 business days.  For questions about your test or how to prepare for your test, please call: Marchia Bond, Cardiac Imaging Nurse Navigator  Gordy Clement, Cardiac Imaging Nurse Navigator Office: [phone]

## 2022-06-22 NOTE — Progress Notes (Signed)
Subjective: Kenneth Poole presents today with chief concern of elongated, thickened, painful, discolored toenails for years. Patient and wife would like to discuss treatment options available. Chief Complaint  Patient presents with   Nail Problem    [facility] PCP-Brake, Andrew PCP VST-"Couple months ago"   Past Medical History:  Diagnosis Date   Back pain    Carotid artery disease (HCC)    1-39% bilateral carotid stenosis  by dopplers 01/2022   Coronary artery disease    s.p PTCA of circumflex lesion in 2004, cardiologist Dr Radford Pax, nonobstructive CAD on CATH in June 2009 with 50% LAD   Dermatitis    Diverticulosis    Dyslipidemia    GERD (gastroesophageal reflux disease)    Hypercholesteremia    Hypercholesteremia    Hypertension    Obesity    Peripheral neuropathy    Psoriasis    Urticaria      Patient Active Problem List   Diagnosis Date Noted   Elevated liver enzymes 05/10/2014   PVD (peripheral vascular disease) ([place]) 05/11/2013   Carotid stenosis    Hypertension    Coronary artery disease    Hypercholesteremia      Past Surgical History:  Procedure Laterality Date   APPENDECTOMY     BACK SURGERY     CARDIAC CATHETERIZATION  June 2009   CHOLECYSTECTOMY     CORONARY STENT PLACEMENT     KIDNEY STONE SURGERY     TONSILLECTOMY       Current Outpatient Medications on File Prior to Visit  Medication Sig Dispense Refill   albuterol (VENTOLIN HFA) 108 (90 Base) MCG/ACT inhaler Inhale 2 puffs into the lungs every 6 (six) hours as needed for wheezing or shortness of breath.     aspirin 81 MG tablet Take 81 mg by mouth daily.     benzonatate (TESSALON) 100 MG capsule Take 1 capsule (100 mg total) by mouth every 8 (eight) hours. 21 capsule 0   colestipol (COLESTID) 1 g tablet Take 1 g by mouth at bedtime.      EPINEPHrine (EPIPEN IJ) Inject as directed as needed (Anaphylaxis).      Evolocumab (REPATHA SURECLICK) 622 MG/ML SOAJ INJECT 1 PEN INTO THE SKIN EVERY 14 DAYS 2  mL 11   ezetimibe (ZETIA) 10 MG tablet Take 1 tablet by mouth once daily 90 tablet 0   Ferrous Sulfate (IRON) 325 (65 Fe) MG TABS Take by mouth.     gabapentin (NEURONTIN) 300 MG capsule Take 300 mg by mouth daily.     levothyroxine (SYNTHROID) 50 MCG tablet Take 50 mcg by mouth daily before breakfast.     metoprolol tartrate (LOPRESSOR) 25 MG tablet Take 1/2 (one-half) tablet by mouth twice daily 90 tablet 0   Multiple Vitamin (MULTIVITAMIN) capsule Take 1 capsule by mouth daily.     nitroGLYCERIN (NITROSTAT) 0.4 MG SL tablet DISSOLVE ONE TABLET UNDER THE TONGUE EVERY 5 MINUTES AS NEEDED FOR CHEST PAIN.  DO NOT EXCEED A TOTAL OF 3 DOSES IN 15 MINUTES 25 tablet 11   omeprazole (PRILOSEC) 40 MG capsule Take 40 mg by mouth 2 (two) times daily.     No current facility-administered medications on file prior to visit.     Allergies  Allergen Reactions   Apple Cider Vinegar Anaphylaxis, Other (See Comments) and Shortness Of Breath    No organic    Bee Pollen Anaphylaxis, Swelling and Rash   Erythromycin Base Anaphylaxis, Hives and Swelling    To any medication that  has mycin ANY MED IN THE [facility] FAMILY   Other Anaphylaxis    ORGANIC VINEGAR - severe hypotension   Rosuvastatin Calcium Other (See Comments)    Severe myalgias on '5mg'$  twice weekly   Sulfa Antibiotics Other (See Comments)   Tetracycline Swelling   Ace Inhibitors    Bee Venom Swelling   Fenofibrate Other (See Comments)    Muscle aches    Simcor [Niacin-Simvastatin Er]     Elevated CPK     Social History   Occupational History   Not on file  Tobacco Use   Smoking status: Former    Types: Cigarettes    Quit date: 05/24/1989    Years since quitting: 33.1   Smokeless tobacco: Never  Vaping Use   Vaping Use: Never used  Substance and Sexual Activity   Alcohol use: No   Drug use: No   Sexual activity: Not on file     Family History  Problem Relation Age of Onset   Alzheimer's disease Mother    Heart attack Father       Immunization History  Administered Date(s) Administered   Fluad Quad(high Dose 65+) 02/14/2019   PFIZER(Purple Top)SARS-COV-2 Vaccination 06/29/2019, 07/24/2019     Objective: Vitals:   06/22/22 1458  BP: 111/62    Kenneth Poole is a pleasant 77 y.o. male WD, WN in NAD. AAO x 3.  Vascular Examination: Vascular status intact b/l with palpable pedal pulses. Pedal hair present b/l. CFT immediate b/l. No edema. No pain with calf compression b/l. Skin temperature gradient WNL b/l. No cyanosis or clubbing noted b/l LE.  Neurological Examination: Sensation grossly intact b/l with 10 gram monofilament. Vibratory sensation intact b/l. Proprioception intact bilaterally.  Dermatological Examination: Pedal skin with normal turgor, texture and tone b/l. Toenails 1-5 b/l thick, discolored, elongated with subungual debris and pain on dorsal palpation. No hyperkeratotic lesions noted b/l. No open wounds b/l LE. No interdigital macerations noted b/l LE.  Musculoskeletal Examination: Muscle strength 5/5 to b/l LE. No pain, crepitus or joint limitation noted with ROM bilateral LE. No gross bony deformities bilaterally. Patient ambulates independent of any assistive aids.  Radiographs: None  Assessment: 1. Pain due to onychomycosis of toenails of both feet     Plan: -Patient's family member present. All questions/concerns addressed on today's visit. -Patient to continue soft, supportive shoe gear daily. -Discussed topical, laser and oral medication. Patient opted for laser therapy. Discussed cost and treatment schedule. Patient will schedule first session at his/her convenience. -Patient instructed to continue use of Formula 7 Emulsion to affected toenails once daily. -Mycotic toenails 1-5 bilaterally were debrided in length and girth with sterile nail nippers and dremel without incident. -Patient/POA to call should there be question/concern in the interim.  Return in about 3 months  (around 09/21/2022).  Marzetta Board, DPM

## 2022-06-22 NOTE — Progress Notes (Signed)
Cardiology Office Note:    Date:  06/22/2022   ID:  Kenneth Poole, DOB 07-Jun-1945, MRN [id number]  PCP:  Kristen Loader, FNP  Cardiologist:  Fransico Him, MD    Referring MD: Kristen Loader, FNP   Chief Complaint  Patient presents with   Coronary Artery Disease   Hypertension   Hyperlipidemia    History of Present Illness:    Kenneth Poole is a 77 y.o. male with a hx of ASCAD s/p PTCA of circumflex lesion in 2004 and cath in June 2009 with 50% LAD.  He also has HTN and dyslipidemia.  Nuclear stress test for CP 01/2017 showed no ischemia.    He is here today for followup and is doing well. He has been having some chest pain that is sporadic and lasts a few minutes at a time and feels like pressure.  It is exertional and sometimes after he eats with no radiation of the pain.   He denies any SOB, DOE, PND, orthopnea, LE edema, dizziness, palpitations or syncope. He is compliant with his meds and is tolerating meds with no SE.    Past Medical History:  Diagnosis Date   Back pain    Carotid artery disease (HCC)    1-39% bilateral carotid stenosis  by dopplers 01/2022   Coronary artery disease    s.p PTCA of circumflex lesion in 2004, cardiologist Dr Radford Pax, nonobstructive CAD on CATH in June 2009 with 50% LAD   Dermatitis    Diverticulosis    Dyslipidemia    GERD (gastroesophageal reflux disease)    Hypercholesteremia    Hypercholesteremia    Hypertension    Obesity    Peripheral neuropathy    Psoriasis    Urticaria     Past Surgical History:  Procedure Laterality Date   APPENDECTOMY     BACK SURGERY     CARDIAC CATHETERIZATION  June 2009   CHOLECYSTECTOMY     CORONARY STENT PLACEMENT     KIDNEY STONE SURGERY     TONSILLECTOMY      Current Medications: Current Meds  Medication Sig   albuterol (VENTOLIN HFA) 108 (90 Base) MCG/ACT inhaler Inhale 2 puffs into the lungs every 6 (six) hours as needed for wheezing or shortness of breath.   aspirin 81 MG tablet  Take 81 mg by mouth daily.   benzonatate (TESSALON) 100 MG capsule Take 1 capsule (100 mg total) by mouth every 8 (eight) hours.   colestipol (COLESTID) 1 g tablet Take 1 g by mouth at bedtime.    EPINEPHrine (EPIPEN IJ) Inject as directed as needed (Anaphylaxis).    Evolocumab (REPATHA SURECLICK) 350 MG/ML SOAJ INJECT 1 PEN INTO THE SKIN EVERY 14 DAYS   ezetimibe (ZETIA) 10 MG tablet Take 1 tablet by mouth once daily   Ferrous Sulfate (IRON) 325 (65 Fe) MG TABS Take by mouth.   gabapentin (NEURONTIN) 300 MG capsule Take 300 mg by mouth daily.   levothyroxine (SYNTHROID) 50 MCG tablet Take 50 mcg by mouth daily before breakfast.   metoprolol tartrate (LOPRESSOR) 25 MG tablet Take 1/2 (one-half) tablet by mouth twice daily   Multiple Vitamin (MULTIVITAMIN) capsule Take 1 capsule by mouth daily.   nitroGLYCERIN (NITROSTAT) 0.4 MG SL tablet DISSOLVE ONE TABLET UNDER THE TONGUE EVERY 5 MINUTES AS NEEDED FOR CHEST PAIN.  DO NOT EXCEED A TOTAL OF 3 DOSES IN 15 MINUTES   omeprazole (PRILOSEC) 40 MG capsule Take 40 mg by mouth 2 (two) times daily.   [  DISCONTINUED] amoxicillin-clavulanate (AUGMENTIN) 875-125 MG tablet Take 1 tablet by mouth every 12 (twelve) hours.   [DISCONTINUED] doxazosin (CARDURA) 4 MG tablet Take 1 tablet (4 mg total) by mouth daily. Please keep January 2024 appt further refills   [DISCONTINUED] hydrochlorothiazide (HYDRODIURIL) 25 MG tablet Take 1 tablet by mouth once daily   [DISCONTINUED] icosapent Ethyl (VASCEPA) 1 g capsule Take 2 capsules by mouth twice daily   [DISCONTINUED] lisinopril (ZESTRIL) 20 MG tablet Take 1 tablet (20 mg total) by mouth daily.   [DISCONTINUED] potassium chloride SA (KLOR-CON M) 20 MEQ tablet Take 1 tablet (20 mEq total) by mouth daily. Please keep appointment in January 2024 for any more refills. Thank you     Allergies:   Apple cider vinegar, Bee pollen, Erythromycin base, Other, Rosuvastatin calcium, Sulfa antibiotics, Tetracycline, Ace inhibitors,  Bee venom, Fenofibrate, and Simcor [niacin-simvastatin er]   Social History   Socioeconomic History   Marital status: Married    Spouse name: Not on file   Number of children: Not on file   Years of education: Not on file   Highest education level: Not on file  Occupational History   Not on file  Tobacco Use   Smoking status: Former    Types: Cigarettes    Quit date: 05/24/1989    Years since quitting: 33.1   Smokeless tobacco: Never  Vaping Use   Vaping Use: Never used  Substance and Sexual Activity   Alcohol use: No   Drug use: No   Sexual activity: Not on file  Other Topics Concern   Not on file  Social History Narrative   Not on file   Social Determinants of Health   Financial Resource Strain: Not on file  Food Insecurity: Not on file  Transportation Needs: Not on file  Physical Activity: Not on file  Stress: Not on file  Social Connections: Not on file     Family History: The patient's family history includes Alzheimer's disease in his mother; Heart attack in his father.  ROS:   Please see the history of present illness.    ROS  All other systems reviewed and negative.   EKGs/Labs/Other Studies Reviewed:    The following studies were reviewed today: EKG  EKG:  EKG is  ordered today and demonstrates NSR with inferior infarct and no ST changes  Recent Labs: No results found for requested labs within last 365 days.   Recent Lipid Panel    Component Value Date/Time   CHOL 97 (L) 04/02/2021 0841   TRIG 185 (H) 04/02/2021 0841   HDL 38 (L) 04/02/2021 0841   CHOLHDL 2.6 04/02/2021 0841   CHOLHDL 5.9 (H) 11/27/2015 0801   VLDL 38 (H) 11/27/2015 0801   LDLCALC 29 04/02/2021 0841   LDLDIRECT 146 (H) 04/09/2015 0802    Physical Exam:    VS:  BP 128/72   Pulse 65   Ht '5\' 9"'$  (1.753 m)   Wt 207 lb 6.4 oz (94.1 kg)   SpO2 96%   BMI 30.63 kg/m     Wt Readings from Last 3 Encounters:  06/22/22 207 lb 6.4 oz (94.1 kg)  04/02/21 202 lb 12.8 oz (92 kg)   04/10/20 203 lb (92.1 kg)    GEN: Well nourished, well developed in no acute distress HEENT: Normal NECK: No JVD; No carotid bruits LYMPHATICS: No lymphadenopathy CARDIAC:RRR, no murmurs, rubs, gallops RESPIRATORY:  Clear to auscultation without rales, wheezing or rhonchi  ABDOMEN: Soft, non-tender, non-distended MUSCULOSKELETAL:  No edema;  No deformity  SKIN: Warm and dry NEUROLOGIC:  Alert and oriented x 3 PSYCHIATRIC:  Normal affect  ASSESSMENT:    1. Coronary artery disease involving native coronary artery of native heart without angina pectoris   2. Primary hypertension   3. Bilateral carotid artery stenosis   4. Hypercholesteremia     PLAN:    In order of problems listed above:    1.  ASCAD -s/p PTCA of circumflex lesion in 2004 and cath in June 2009 with 50% LAD.   -Nuclear stress test 01/2017 showed no ischemia.  -He has been having intermittent chest pain with exertion -recommend Stress PET CT to rule out ischemia -Shared Decision Making/Informed Consent The risks [chest pain, shortness of breath, cardiac arrhythmias, dizziness, blood pressure fluctuations, myocardial infarction, stroke/transient ischemic attack, nausea, vomiting, allergic reaction, radiation exposure, metallic taste sensation and life-threatening complications (estimated to be 1 in 10,000)], benefits (risk stratification, diagnosing coronary artery disease, treatment guidance) and alternatives of a cardiac PET stress test were discussed in detail with Mr. Horacek and he agrees to proceed. -Continue prescription drug management with aspirin 81 mg daily, Lopressor 12.5 mg twice daily and Repatha with as needed refills   2.  HTN  -BP is controlled on exam today -Continue drug management with doxazosin 4 mg daily, HCTZ 25 mg daily, lisinopril 20 mg daily, Lopressor 12.5 mg twice daily with as needed refills -I have personally reviewed and interpreted outside labs performed by patient's PCP which showed  serum creatinine 0.79 and potassium 4.6 on 02/18/2022  3.  Bilateral carotid artery stenosis  -Carotid Dopplers 02/12/2021 and 9/23 showed 1-39% bilateral carotid stenosis and < 50% right common carotid artery stenosis -Repeat carotid Dopplers in 9/24 -continue statin and ASA.    4.  Hyperlipidemia  -LDL goal < 70.  -I have personally reviewed and interpreted outside labs performed by patient's PCP which showed  -Check FLP and ALT -Continue prescription drug management with Repatha, Vascepa 2 g twice daily and Zetia 10 mg daily with as needed refills -He is statin intolerant  Medication Adjustments/Labs and Tests Ordered: Current medicines are reviewed at length with the patient today.  Concerns regarding medicines are outlined above.  Orders Placed This Encounter  Procedures   EKG 12-Lead    Meds ordered this encounter  Medications   icosapent Ethyl (VASCEPA) 1 g capsule    Sig: Take 2 capsules (2 g total) by mouth 2 (two) times daily.    Dispense:  360 capsule    Refill:  3   doxazosin (CARDURA) 4 MG tablet    Sig: Take 1 tablet (4 mg total) by mouth daily. Please keep January 2024 appt further refills    Dispense:  90 tablet    Refill:  3   hydrochlorothiazide (HYDRODIURIL) 25 MG tablet    Sig: Take 1 tablet (25 mg total) by mouth daily.    Dispense:  90 tablet    Refill:  3   potassium chloride SA (KLOR-CON M) 20 MEQ tablet    Sig: Take 1 tablet (20 mEq total) by mouth daily. Please keep appointment in January 2024 for any more refills. Thank you    Dispense:  90 tablet    Refill:  3   lisinopril (ZESTRIL) 20 MG tablet    Sig: Take 1 tablet (20 mg total) by mouth daily.    Dispense:  90 tablet    Refill:  3    Signed, Fransico Him, MD  06/22/2022 8:16 AM  [facility]

## 2022-06-23 ENCOUNTER — Telehealth: Payer: Self-pay

## 2022-06-23 LAB — LIPID PANEL
Chol/HDL Ratio: 2.4 ratio (ref 0.0–5.0)
Cholesterol, Total: 97 mg/dL — ABNORMAL LOW (ref 100–199)
HDL: 40 mg/dL (ref >39)
LDL Chol Calc (NIH): 24 mg/dL (ref 0–99)
Triglycerides: 210 mg/dL — ABNORMAL HIGH (ref 0–149)
VLDL Cholesterol Cal: 33 mg/dL (ref 5–40)

## 2022-06-23 LAB — ALT: ALT: 39 IU/L (ref 0–44)

## 2022-06-23 NOTE — Addendum Note (Signed)
Addended by: Callie Fielding D on: 06/23/2022 05:16 PM   Modules accepted: Orders

## 2022-06-23 NOTE — Telephone Encounter (Signed)
-----  Message from Sueanne Margarita, MD sent at 06/23/2022  8:02 AM EST ----- Please verify patient was fasting for these labs

## 2022-06-23 NOTE — Telephone Encounter (Signed)
Patient stated he did fast 8 hours prior to having his blood drawn.

## 2022-06-29 ENCOUNTER — Other Ambulatory Visit: Payer: Self-pay | Admitting: Cardiology

## 2022-07-02 ENCOUNTER — Telehealth (HOSPITAL_COMMUNITY): Payer: Self-pay | Admitting: *Deleted

## 2022-07-02 NOTE — Telephone Encounter (Signed)
Reaching out to patient to offer assistance regarding upcoming cardiac imaging study; pt verbalizes understanding of appt date/time, parking situation and where to check in, pre-test NPO status and verified current allergies; name and call back number provided for further questions should they arise  Kenneth Clement RN Navigator Cardiac Imaging [facility] [phone] office [phone] cell  Patient aware to avoid caffeine 12 hours prior to his cardiac PET scan.

## 2022-07-06 ENCOUNTER — Other Ambulatory Visit: Payer: Medicare Other

## 2022-07-06 ENCOUNTER — Encounter (HOSPITAL_COMMUNITY)
Admission: RE | Admit: 2022-07-06 | Discharge: 2022-07-06 | Disposition: A | Discharge status: Home / Self Care | Payer: Medicare Other | Source: Ambulatory Visit | Attending: Cardiology | Admitting: Cardiology

## 2022-07-06 ENCOUNTER — Telehealth: Payer: Self-pay

## 2022-07-06 DIAGNOSIS — I2510 Atherosclerotic heart disease of native coronary artery without angina pectoris: Secondary | ICD-10-CM | POA: Insufficient documentation

## 2022-07-06 DIAGNOSIS — I6523 Occlusion and stenosis of bilateral carotid arteries: Secondary | ICD-10-CM | POA: Diagnosis not present

## 2022-07-06 DIAGNOSIS — I10 Essential (primary) hypertension: Secondary | ICD-10-CM | POA: Insufficient documentation

## 2022-07-06 DIAGNOSIS — E7800 Pure hypercholesterolemia, unspecified: Secondary | ICD-10-CM | POA: Diagnosis not present

## 2022-07-06 LAB — NM PET CT CARDIAC PERFUSION MULTI W/ABSOLUTE BLOODFLOW
LV dias vol: 98 mL (ref 62–150)
LV sys vol: 32 mL
Nuc Rest EF: 65 %
Nuc Stress EF: 67 %
Rest Nuclear Isotope Dose: 24.4 mCi
ST Depression (mm): 0 mm
Stress Nuclear Isotope Dose: 24.4 mCi
TID: 0.93

## 2022-07-06 MED ORDER — RUBIDIUM RB82 GENERATOR (RUBYFILL)
24.4000 | PACK | Freq: Once | INTRAVENOUS | Status: AC
  Administered 2022-07-06: 24.4 via INTRAVENOUS

## 2022-07-06 MED ORDER — CAFFEINE CITRATE BASE COMPONENT 10 MG/ML IV SOLN
INTRAVENOUS | Status: AC
  Filled 2022-07-06: qty 3

## 2022-07-06 MED ORDER — REGADENOSON 0.4 MG/5ML IV SOLN
0.4000 mg | Freq: Once | INTRAVENOUS | Status: AC
  Administered 2022-07-06: 0.4 mg via INTRAVENOUS

## 2022-07-06 MED ORDER — REGADENOSON 0.4 MG/5ML IV SOLN
INTRAVENOUS | Status: AC
  Filled 2022-07-06: qty 5

## 2022-07-06 MED ORDER — DEXTROSE 5 % IV SOLN
INTRAVENOUS | Status: AC
  Filled 2022-07-06: qty 50

## 2022-07-06 NOTE — Telephone Encounter (Signed)
Patient wanted to know if it's okay to schedule his upper endoscopy and colonoscopy. The patient has been notified of the result and verbalized understanding.  All questions (if any) were answered. Gershon Crane, LPN [id number] PM

## 2022-07-06 NOTE — Telephone Encounter (Signed)
-----   Message from Sueanne Margarita, MD sent at 07/06/2022  1:16 PM EST ----- Normal non cardiac portion of stress PET CT

## 2022-07-06 NOTE — Progress Notes (Signed)
Tolerated test well

## 2022-07-07 ENCOUNTER — Telehealth: Payer: Self-pay

## 2022-07-07 NOTE — Telephone Encounter (Signed)
Called to discuss results with patient, no answer, left message per [facility] asking patient to call back for results.

## 2022-07-07 NOTE — Telephone Encounter (Signed)
-----   Message from Gershon Crane, LPN sent at 0/01/3817  1:32 PM EST ----- Please advise ----- Message ----- From: Sueanne Margarita, MD Sent: 07/06/2022   1:16 PM EST To: [facility] Triage  Normal non cardiac portion of stress PET CT

## 2022-07-09 ENCOUNTER — Ambulatory Visit (INDEPENDENT_AMBULATORY_CARE_PROVIDER_SITE_OTHER): Payer: Medicare Other | Admitting: *Deleted

## 2022-07-09 DIAGNOSIS — B351 Tinea unguium: Secondary | ICD-10-CM

## 2022-07-09 NOTE — Progress Notes (Signed)
Patient presents today for the 1st laser treatment. Diagnosed with mycotic nail infection by Dr. Elisha Ponder.   Toenail most affected all ten nails.  All other systems are negative.  Nails were filed thin. Laser therapy was administered to 1-5 toenails bilateral and patient tolerated the treatment well. All safety precautions were in place.   Single laser pass was done on non-affected nails.   Follow up in 4 weeks for laser #2

## 2022-07-09 NOTE — Patient Instructions (Signed)

## 2022-07-14 ENCOUNTER — Telehealth: Payer: Self-pay

## 2022-07-14 NOTE — Telephone Encounter (Signed)
-----   Message from Michae Kava, [place] sent at 07/12/2022  8:05 AM EST ----- Regarding: FW: Pre-op clearance? Good morning Danae Chen,   You sent this to me asking if I had a pre op clearance for this pt.  Do not and have not seen a clearance request come in. Pt will need to contact the surgeon office and ask them to please fax over the clearance request to fax [phone] attn: pre op team.   Thank you  Arbie Cookey  ----- Message ----- From: Joni Reining, RN Sent: 07/09/2022   6:43 PM EST To: Michae Kava, CMA; April Garrison, [place] Subject: Pre-op clearance?                              Just curious, have you received a pre-op clearance request on this patient for endoscopy/colonoscopy? Danae Chen ----- Message ----- From: Sueanne Margarita, MD Sent: 07/08/2022   9:00 AM EST To: Joni Reining, RN  Yes okay to schedule endoscopy and colonoscopy ----- Message ----- From: Joni Reining, RN Sent: 07/07/2022   1:42 PM EST To: Sueanne Margarita, MD   ----- Message ----- From: Gershon Crane, LPN Sent: [phone]   1:30 PM EST To: Sueanne Margarita, MD; Joni Reining, RN   ----- Message ----- From: Sueanne Margarita, MD Sent: 07/07/2022   5:42 AM EST To: [facility] Triage  Low risk stress test please find out if still having CP

## 2022-07-14 NOTE — Telephone Encounter (Signed)
Called patient and reviewed with him that we have not received any pre-op clearance requests for an endoscopy/colonoscopy. Provided patient with Pre-op team fax # and advised he contact his surgeon's office and let him/her know he has been seen by his cardiologist and all testing has been completed. Advised patient to have surgeon fax over any pre-op clearance requests. Patient verbalizes understanding and agrees to plan.

## 2022-07-20 ENCOUNTER — Telehealth: Payer: Self-pay

## 2022-07-20 NOTE — Telephone Encounter (Signed)
Covering preop today. Patient had recent office visit 06/22/22 for chest pain. Per chart h/o prior hx of PTCA to Cx 2004 with residual 50% LAD by cath 2009, HTN, dyslipidemia. Nuclear PET scan 07/06/22 showed "no infarction, though cannot exclude mild, basal inferior stress perfusion defect, in the setting of suboptimal study this is more consistent with artifact. The study is low risk. Myocardial blood flow reserve is not reported in this patient due to technical or patient-specific concerns (significant motion artifact even after motion correction with suboptimal acquisition) that affect accuracy. Severe coronary calcifications were present. Coronary calcifications were present in the left anterior descending artery, left circumflex artery and right coronary artery distribution(s)." Based on study Dr. Radford Pax had nurse reach out to pt to find out if having any chest pain, he replied "Patient stated he hasn't had any chest pain. Its feels like indigestion , he stated certain foods trigger his indigestion."  Given recent symptoms, we typically require f/u visit to re-evaluate prior to clearing but since this is just colonoscopy will route to Dr. Radford Pax to see if she is OK clearing to proceed and hold aspirin based on recent study. Dr. Radford Pax - Please route response to P CV DIV PREOP (the pre-op pool). Thank you.

## 2022-07-20 NOTE — Telephone Encounter (Signed)
   Patient Name: Kenneth Poole  DOB: [birth year] MRN: [id number]  Primary Cardiologist: Fransico Him, MD  Chart reviewed as part of pre-operative protocol coverage. I reviewed with Dr. Radford Pax who feels it is OK to proceed with colonoscopy as requested. We generally recommend ASA be continued for colonoscopy but per secure chat she has also granted permission to hold aspirin if GI feels needed.  Will route this bundled recommendation to requesting provider via Epic fax function. Please call with questions.  Charlie Pitter, PA-C 07/20/2022, 3:02 PM

## 2022-07-20 NOTE — Telephone Encounter (Signed)
..     Pre-operative Risk Assessment    Patient Name: Kenneth Poole  DOB: [birth year] MRN: [id number]      Request for Surgical Clearance    Procedure:   COLONOSCOPY  Date of Surgery:  Clearance TBD                                 Surgeon:  DR Otis Brace Surgeon's Group or Practice Name:  [facility] Phone number:  [phone] Fax number:  [phone]   Type of Clearance Requested:   - Medical  - Pharmacy:  Hold Aspirin     Type of Anesthesia:   PROPOFOL   Additional requests/questions:    Gwenlyn Found   07/20/2022, 1:06 PM

## 2022-07-21 ENCOUNTER — Telehealth: Payer: Self-pay

## 2022-07-21 NOTE — Telephone Encounter (Signed)
-----   Message from Sueanne Margarita, MD sent at 06/23/2022 12:28 PM EST ----- Lipids still not at goal.  Please forward to lipid clinic for further recommendations.

## 2022-07-21 NOTE — Telephone Encounter (Signed)
Reviewed Pharm D recommendations with patient. Patient states he does not drink alcohol and he has not been watching his sugar or carbs lately. He states he has an appointment with the lipid clinic coming up and he will have his questions ready about how to better balance his diet.

## 2022-07-27 ENCOUNTER — Other Ambulatory Visit: Payer: Self-pay | Admitting: Cardiology

## 2022-08-03 NOTE — Progress Notes (Unsigned)
Patient ID: Kenneth Poole                 DOB: [birth year]                    MRN: [id number]     HPI: Kenneth Poole is a 77 y.o. male patient referred to lipid clinic by Dr Radford Pax. PMH is significant for CAD s/p PTCA of circumflex lesion in 2004, cath in 2009 with 50% LAD, 1-39% bilateral carotid stenosis on 01/2022 dopplers, HTN, HLD, and statin intolerance. LDL currently well controlled on Repatha and ezetimibe. TG remain mildly elevated on Vascepa and pt presents to clinic for follow up.  Pt has tried multiple statins including Livalo '1mg'$  daily, Crestor, Lipitor '10mg'$  and '20mg'$  daily but he experienced myalgias and weakness in his legs and shoulders with each statin. These symptoms appeared within 1 week of statin initiation and discontinued upon drug discontinuation. He has also taken Simcor which increased his CK. Prior chronic elevation of LFTs on or off statin therapy. ALT normal on most recent 2 checks. Previously took Insurance risk surveyor but insurance formulary changed to [place] - has tolerated both of these medications well.  Pt presents today with his wife. He was fasting when his labs were drawn. Adherent to current meds and tolerating well. GI is prescribing colestipol. Cost of Repatha gets high when in the donut hole. No alcohol intake. Likes carbs - bread and potatoes. Drinks sweetened drinks - liquid IV and sweet tea, diet soda.  Current Medications:  Repatha '140mg'$  Q2W Ezetimibe '10mg'$  daily  Vascepa 2g BID Colestipol 1g daily  Intolerances:  Rosuvastatin '5mg'$  twice weekly - myalgias Livalo '1mg'$  - myalgias Atorvastatin 10-'20mg'$  - myalgias Fenofibrate - myalgias Simvastatin-niacin - elevated CK  Risk Factors: premature CAD  LDL goal: '55mg'$ /dL  Diet: likes carbs and potatoes, drinks liquid IV, no alcohol, 2-3 diet Pepsi, 2 sweet tea   Exercise: lots of yardwork, mowing.  Family History: MI in his father.  Social History: Former tobacco use, quit in 1991, no alcohol or drug  use.  Labs: 06/22/22: TC 97, TG 210, HDL 40, LDL 24, ALT 39  Past Medical History:  Diagnosis Date   Back pain    Carotid artery disease (HCC)    1-39% bilateral carotid stenosis  by dopplers 01/2022   Coronary artery disease    s.p PTCA of circumflex lesion in 2004, cardiologist Dr Radford Pax, nonobstructive CAD on CATH in June 2009 with 50% LAD   Dermatitis    Diverticulosis    Dyslipidemia    GERD (gastroesophageal reflux disease)    Hypercholesteremia    Hypercholesteremia    Hypertension    Obesity    Peripheral neuropathy    Psoriasis    Urticaria     Current Outpatient Medications on File Prior to Visit  Medication Sig Dispense Refill   albuterol (VENTOLIN HFA) 108 (90 Base) MCG/ACT inhaler Inhale 2 puffs into the lungs every 6 (six) hours as needed for wheezing or shortness of breath.     aspirin 81 MG tablet Take 81 mg by mouth daily.     benzonatate (TESSALON) 100 MG capsule Take 1 capsule (100 mg total) by mouth every 8 (eight) hours. 21 capsule 0   colestipol (COLESTID) 1 g tablet Take 1 g by mouth at bedtime.      doxazosin (CARDURA) 4 MG tablet Take 1 tablet (4 mg total) by mouth daily. Please keep January 2024 appt further refills 90 tablet 3  EPINEPHrine (EPIPEN IJ) Inject as directed as needed (Anaphylaxis).      Evolocumab (REPATHA SURECLICK) [id number] MG/ML SOAJ INJECT 1 PEN INTO THE SKIN EVERY 14 DAYS 2 mL 11   ezetimibe (ZETIA) 10 MG tablet TAKE 1 TABLET BY MOUTH ONCE DAILY . APPOINTMENT REQUIRED FOR FUTURE REFILLS 90 tablet 3   Ferrous Sulfate (IRON) 325 (65 Fe) MG TABS Take by mouth.     gabapentin (NEURONTIN) 300 MG capsule Take 300 mg by mouth daily.     hydrochlorothiazide (HYDRODIURIL) 25 MG tablet Take 1 tablet (25 mg total) by mouth daily. 90 tablet 3   icosapent Ethyl (VASCEPA) 1 g capsule Take 2 capsules (2 g total) by mouth 2 (two) times daily. 360 capsule 3   levothyroxine (SYNTHROID) 50 MCG tablet Take 50 mcg by mouth daily before breakfast.      lisinopril (ZESTRIL) 20 MG tablet Take 1 tablet (20 mg total) by mouth daily. 90 tablet 3   metoprolol tartrate (LOPRESSOR) 25 MG tablet Take 1/2 (one-half) tablet by mouth twice daily 90 tablet 3   Multiple Vitamin (MULTIVITAMIN) capsule Take 1 capsule by mouth daily.     nitroGLYCERIN (NITROSTAT) 0.4 MG SL tablet DISSOLVE ONE TABLET UNDER THE TONGUE EVERY 5 MINUTES AS NEEDED FOR CHEST PAIN.  DO NOT EXCEED A TOTAL OF 3 DOSES IN 15 MINUTES 25 tablet 11   omeprazole (PRILOSEC) 40 MG capsule Take 40 mg by mouth 2 (two) times daily.     potassium chloride SA (KLOR-CON M) 20 MEQ tablet Take 1 tablet (20 mEq total) by mouth daily. Please keep appointment in January 2024 for any more refills. Thank you 90 tablet 3   No current facility-administered medications on file prior to visit.    Allergies  Allergen Reactions   Apple Cider Vinegar Anaphylaxis, Other (See Comments) and Shortness Of Breath    No organic    Bee Pollen Anaphylaxis, Swelling and Rash   Erythromycin Base Anaphylaxis, Hives and Swelling    To any medication that has mycin ANY MED IN THE MYCIN FAMILY   Other Anaphylaxis    ORGANIC VINEGAR - severe hypotension   Rosuvastatin Calcium Other (See Comments)    Severe myalgias on '5mg'$  twice weekly   Sulfa Antibiotics Other (See Comments)   Tetracycline Swelling   Ace Inhibitors    Bee Venom Swelling   Fenofibrate Other (See Comments)    Muscle aches    Simcor [Niacin-Simvastatin Er]     Elevated CPK    Assessment/Plan:  1. Hyperlipidemia - LDL excellent at 24 at goal < 55 given premature CAD, will continue Repatha '140mg'$  Q2W and ezetimibe '10mg'$  daily, previously intolerant to statins. TG mildly elevated at 210 above goal < 150. Will continue Vascepa 2g BID, previously intolerant to fenofibrate. He was fasting when labs were drawn. No alcohol intake but does like carbs and sweet drinks. He will change from sweet tea to unsweet tea, and will change from liquid IV drinks to the sugar  free formulation which should normalize his TG. Encouraged him to stay active. Enrolled pt in [place] to help with copays on his branded lipid meds. Recheck lipids with next annual check.  Lacheryl Niesen E. Panda Crossin, PharmD, BCACP, [place]. [address], [place], [place] [address] Phone: [phone]; Fax: [phone] 08/04/2022 9:19 AM

## 2022-08-04 ENCOUNTER — Encounter: Payer: Self-pay | Admitting: Pharmacist

## 2022-08-04 ENCOUNTER — Ambulatory Visit: Payer: Medicare Other | Attending: Cardiovascular Disease | Admitting: Pharmacist

## 2022-08-04 DIAGNOSIS — E7800 Pure hypercholesterolemia, unspecified: Secondary | ICD-10-CM | POA: Diagnosis not present

## 2022-08-04 NOTE — Patient Instructions (Addendum)
Your LDL cholesterol is excellent at 24 and is at your goal < 55  Your triglycerides are mildly elevated at 210 (goal < 150)  Change your liquid IV to the sugar free formulation, and change from sweet tea to unsweet tea (you can add your own sweetener like Stevia, Monia Pouch, Splenda, etc)  I'll send you information about the [employer] which will bring the cost of your Repatha and Vascepa to $0

## 2022-08-06 ENCOUNTER — Ambulatory Visit (INDEPENDENT_AMBULATORY_CARE_PROVIDER_SITE_OTHER): Payer: Medicare Other | Admitting: *Deleted

## 2022-08-06 DIAGNOSIS — M79674 Pain in right toe(s): Secondary | ICD-10-CM

## 2022-08-06 DIAGNOSIS — M79675 Pain in left toe(s): Secondary | ICD-10-CM

## 2022-08-06 DIAGNOSIS — B351 Tinea unguium: Secondary | ICD-10-CM

## 2022-08-06 NOTE — Progress Notes (Signed)
Patient presents today for the 2nd laser treatment. Diagnosed with mycotic nail infection by Dr. Elisha Ponder.   Toenail most affected all ten nails. Not much change as of yet.  All other systems are negative.  Nails were filed thin. Laser therapy was administered to 1-5 toenails bilateral and patient tolerated the treatment well. All safety precautions were in place.   Single laser pass was done on non-affected nails.   Follow up in 4 weeks for laser #3

## 2022-08-12 DIAGNOSIS — I10 Essential (primary) hypertension: Secondary | ICD-10-CM | POA: Diagnosis not present

## 2022-08-12 DIAGNOSIS — D509 Iron deficiency anemia, unspecified: Secondary | ICD-10-CM | POA: Diagnosis not present

## 2022-08-12 DIAGNOSIS — R7301 Impaired fasting glucose: Secondary | ICD-10-CM | POA: Diagnosis not present

## 2022-08-12 DIAGNOSIS — K760 Fatty (change of) liver, not elsewhere classified: Secondary | ICD-10-CM | POA: Diagnosis not present

## 2022-08-12 DIAGNOSIS — K219 Gastro-esophageal reflux disease without esophagitis: Secondary | ICD-10-CM | POA: Diagnosis not present

## 2022-08-12 DIAGNOSIS — Z125 Encounter for screening for malignant neoplasm of prostate: Secondary | ICD-10-CM | POA: Diagnosis not present

## 2022-08-12 DIAGNOSIS — E785 Hyperlipidemia, unspecified: Secondary | ICD-10-CM | POA: Diagnosis not present

## 2022-08-12 DIAGNOSIS — I700 Atherosclerosis of aorta: Secondary | ICD-10-CM | POA: Diagnosis not present

## 2022-08-12 DIAGNOSIS — E039 Hypothyroidism, unspecified: Secondary | ICD-10-CM | POA: Diagnosis not present

## 2022-08-12 DIAGNOSIS — Z0000 Encounter for general adult medical examination without abnormal findings: Secondary | ICD-10-CM | POA: Diagnosis not present

## 2022-08-19 DIAGNOSIS — K2930 Chronic superficial gastritis without bleeding: Secondary | ICD-10-CM | POA: Diagnosis not present

## 2022-08-19 DIAGNOSIS — Z86010 Personal history of colonic polyps: Secondary | ICD-10-CM | POA: Diagnosis not present

## 2022-08-19 DIAGNOSIS — K31A0 Gastric intestinal metaplasia, unspecified: Secondary | ICD-10-CM | POA: Diagnosis not present

## 2022-08-19 DIAGNOSIS — K2289 Other specified disease of esophagus: Secondary | ICD-10-CM | POA: Diagnosis not present

## 2022-08-19 DIAGNOSIS — Z09 Encounter for follow-up examination after completed treatment for conditions other than malignant neoplasm: Secondary | ICD-10-CM | POA: Diagnosis not present

## 2022-08-19 DIAGNOSIS — K259 Gastric ulcer, unspecified as acute or chronic, without hemorrhage or perforation: Secondary | ICD-10-CM | POA: Diagnosis not present

## 2022-08-19 DIAGNOSIS — K317 Polyp of stomach and duodenum: Secondary | ICD-10-CM | POA: Diagnosis not present

## 2022-08-27 DIAGNOSIS — K2930 Chronic superficial gastritis without bleeding: Secondary | ICD-10-CM | POA: Diagnosis not present

## 2022-09-03 ENCOUNTER — Ambulatory Visit (INDEPENDENT_AMBULATORY_CARE_PROVIDER_SITE_OTHER): Payer: Medicare Other

## 2022-09-03 DIAGNOSIS — B351 Tinea unguium: Secondary | ICD-10-CM

## 2022-09-03 NOTE — Progress Notes (Signed)
Patient presents today for the 3rd laser treatment. Diagnosed with mycotic nail infection by Dr. Eloy End.   Toenail most affected 1st.  All other systems are negative.  Nails were filed thin. Laser therapy was administered to 1-5 toenails bialaterally and patient tolerated the treatment well. All safety precautions were in place.   Single laser pass was done on non-affected nails.   Follow up in 6 weeks for laser # 4.

## 2022-09-27 DIAGNOSIS — H43391 Other vitreous opacities, right eye: Secondary | ICD-10-CM | POA: Diagnosis not present

## 2022-10-05 ENCOUNTER — Ambulatory Visit: Payer: Medicare Other | Admitting: Podiatry

## 2022-10-06 DIAGNOSIS — M19031 Primary osteoarthritis, right wrist: Secondary | ICD-10-CM | POA: Insufficient documentation

## 2022-10-06 DIAGNOSIS — M13831 Other specified arthritis, right wrist: Secondary | ICD-10-CM | POA: Diagnosis not present

## 2022-10-06 DIAGNOSIS — M25531 Pain in right wrist: Secondary | ICD-10-CM | POA: Diagnosis not present

## 2022-10-06 DIAGNOSIS — M19032 Primary osteoarthritis, left wrist: Secondary | ICD-10-CM | POA: Insufficient documentation

## 2022-10-14 DIAGNOSIS — H43391 Other vitreous opacities, right eye: Secondary | ICD-10-CM | POA: Diagnosis not present

## 2022-10-15 ENCOUNTER — Ambulatory Visit (INDEPENDENT_AMBULATORY_CARE_PROVIDER_SITE_OTHER): Payer: Medicare Other

## 2022-10-15 DIAGNOSIS — B351 Tinea unguium: Secondary | ICD-10-CM

## 2022-10-15 DIAGNOSIS — M79675 Pain in left toe(s): Secondary | ICD-10-CM

## 2022-10-15 DIAGNOSIS — M79674 Pain in right toe(s): Secondary | ICD-10-CM

## 2022-10-15 NOTE — Progress Notes (Signed)
Patient presents today for the 4th laser treatment. Diagnosed with mycotic nail infection by Dr. Eloy End.   Toenail most affected 1st.  All other systems are negative.  Nails were filed thin. Laser therapy was administered to 1-5 toenails bialaterally and patient tolerated the treatment well. All safety precautions were in place.   Single laser pass was done on non-affected nails.   Follow up in 6 weeks for laser # 5

## 2022-10-20 ENCOUNTER — Telehealth: Payer: Self-pay | Admitting: Cardiology

## 2022-10-20 DIAGNOSIS — H43811 Vitreous degeneration, right eye: Secondary | ICD-10-CM | POA: Diagnosis not present

## 2022-10-20 DIAGNOSIS — R7301 Impaired fasting glucose: Secondary | ICD-10-CM | POA: Diagnosis not present

## 2022-10-20 DIAGNOSIS — S70369A Insect bite (nonvenomous), unspecified thigh, initial encounter: Secondary | ICD-10-CM | POA: Diagnosis not present

## 2022-10-20 DIAGNOSIS — I10 Essential (primary) hypertension: Secondary | ICD-10-CM | POA: Diagnosis not present

## 2022-10-20 DIAGNOSIS — K760 Fatty (change of) liver, not elsewhere classified: Secondary | ICD-10-CM | POA: Diagnosis not present

## 2022-10-20 DIAGNOSIS — N529 Male erectile dysfunction, unspecified: Secondary | ICD-10-CM | POA: Diagnosis not present

## 2022-10-20 DIAGNOSIS — R5383 Other fatigue: Secondary | ICD-10-CM | POA: Diagnosis not present

## 2022-10-20 DIAGNOSIS — R0602 Shortness of breath: Secondary | ICD-10-CM | POA: Diagnosis not present

## 2022-10-20 DIAGNOSIS — E039 Hypothyroidism, unspecified: Secondary | ICD-10-CM | POA: Diagnosis not present

## 2022-10-20 DIAGNOSIS — I6523 Occlusion and stenosis of bilateral carotid arteries: Secondary | ICD-10-CM | POA: Diagnosis not present

## 2022-10-20 DIAGNOSIS — D509 Iron deficiency anemia, unspecified: Secondary | ICD-10-CM | POA: Diagnosis not present

## 2022-10-20 DIAGNOSIS — R079 Chest pain, unspecified: Secondary | ICD-10-CM | POA: Diagnosis not present

## 2022-10-20 LAB — COMPREHENSIVE METABOLIC PANEL: EGFR: 93

## 2022-10-20 NOTE — Telephone Encounter (Signed)
Dr. Elberta Fortis received DOD call from PCP office. Per Dr. Harold Hedge pt needs appt w/ Dr. Mayford Knife or APP within the next week. Forwarding to schedulers to aid in scheduling pt appt

## 2022-10-20 NOTE — Telephone Encounter (Signed)
Drema Pry, FNP saw patient in office today. He has concerns with ongoing fatigue and elevated troponin. Would like to discuss with MD.

## 2022-10-25 NOTE — Progress Notes (Unsigned)
Office Visit    Patient Name: Kenneth Poole Date of Encounter: 10/26/2022  PCP:  Soundra Pilon, FNP   [facility]  Cardiologist:  Armanda Magic, MD  Advanced Practice Provider:  No care team member to display Electrophysiologist:  None  HPI   Kenneth Poole is a 77 y.o. male with a past medical history of AAS CAD status post PTCA of circumflex lesion in 2004 and cardiac cath June 2009 with 50% LAD stenosis, hypertension, hyperlipidemia presents today for follow-up appointment.  Nuclear stress test for chest pain 01/2017 showed no ischemia.  He was recently seen by Dr. Mayford Knife 06/22/2022 and at that time he had been having some sporadic chest pain that lasted a few minutes at a time which felt like pressure.  It was exertional and sometimes after he ate with no radiation of pain.  He denied SOB, DOE, PND, orthopnea, lower extremity edema, dizziness, palpitations or syncope.  Compliant with medications.  At that time, she recommended stress PET/CT to rule out ischemia.  Stress PET/CT showed no infarct.  Left ventricular ejection fraction is 65%.  Coronary calcium was present.  Severe coronary calcifications were present as well.  Aortic atherosclerosis was noted.  Today, he has been doing good. Sometimes he has some tightness from eating but happens when he not eating too. He does have a GI doctor that diagnosed him with an ulcer. He does not think it was bleeding. We went through some labs with him today. He later described the tightness in his chest when he is working outside. It would maybe last for 30 seconds and then resolve on its own. He never needed to use nitro. We decided to hold off on a further ischemic w/o at this time. If episodes last longer or become more frequent he is to give [place] a call. He has never needed his nitros.   Reports no shortness of breath nor dyspnea on exertion. No edema, orthopnea, PND. Reports no palpitations.   Past Medical  History    Past Medical History:  Diagnosis Date   Back pain    Carotid artery disease (HCC)    1-39% bilateral carotid stenosis  by dopplers 01/2022   Coronary artery disease    s.p PTCA of circumflex lesion in 2004, cardiologist Dr Mayford Knife, nonobstructive CAD on CATH in June 2009 with 50% LAD   Dermatitis    Diverticulosis    Dyslipidemia    GERD (gastroesophageal reflux disease)    Hypercholesteremia    Hypercholesteremia    Hypertension    Obesity    Peripheral neuropathy    Psoriasis    Urticaria    Past Surgical History:  Procedure Laterality Date   APPENDECTOMY     BACK SURGERY     CARDIAC CATHETERIZATION  June 2009   CHOLECYSTECTOMY     CORONARY STENT PLACEMENT     KIDNEY STONE SURGERY     TONSILLECTOMY      Allergies  Allergies  Allergen Reactions   Apple Cider Vinegar Anaphylaxis, Other (See Comments) and Shortness Of Breath    No organic    Bee Pollen Anaphylaxis, Swelling and Rash   Erythromycin Base Anaphylaxis, Hives and Swelling    To any medication that has mycin ANY MED IN THE MYCIN FAMILY   Other Anaphylaxis    ORGANIC VINEGAR - severe hypotension   Rosuvastatin Calcium Other (See Comments)    Severe myalgias on 5mg  twice weekly   Sulfa Antibiotics Other (  See Comments)   Tetracycline Swelling    Swelling in hands and face   Ace Inhibitors    Atorvastatin     Myalgias on 10-20mg     Bee Venom Swelling   Fenofibrate Other (See Comments)    Muscle aches    Livalo [Pitavastatin]     Myalgias on 1mg    Simcor [Niacin-Simvastatin Er]     Elevated CPK    EKGs/Labs/Other Studies Reviewed:   The following studies were reviewed today: Cardiac Studies & Procedures     STRESS TESTS  NM PET CT CARDIAC PERFUSION MULTI W/ABSOLUTE BLOODFLOW 07/06/2022  Narrative   Findings are consistent with no infarction. Though cannot exclude mild, basal inferior stress perfusion defect, in the setting of suboptimal study this is more consistent with artifact.  The study is low risk.   Rest left ventricular function is normal. Rest EF: 65 %. Stress left ventricular function is normal. Stress EF: 67 %. End diastolic cavity size is normal. End systolic cavity size is normal.   Myocardial blood flow reserve is not reported in this patient due to technical or patient-specific concerns (significant motion artifact even after motion correction with suboptimal acquisition) that affect accuracy.   Coronary calcium was present on the attenuation correction CT images. Severe coronary calcifications were present. Coronary calcifications were present in the left anterior descending artery, left circumflex artery and right coronary artery distribution(s). Prior LCX intervention noted. Aortic atherosclerosis noted.   Electronically Signed by Riley Lam MD  CLINICAL DATA:  This over-read does not include interpretation of cardiac or coronary anatomy or pathology. No interpretation the PET data set. The cardiac PET-CT interpretation by the cardiologist is attached.  COMPARISON:  None Available.  FINDINGS: Limited view of the lung parenchyma demonstrates no suspicious nodularity. Airways are normal.  Limited view of the mediastinum demonstrates no adenopathy. Esophagus normal.  Limited view of the upper abdomen unremarkable.  Limited view of the skeleton and chest wall is unremarkable.  IMPRESSION: No significant extracardiac findings.   Electronically Signed By: Genevive Bi M.D. On: 07/06/2022 12:14               EKG:  EKG is not ordered today.    Recent Labs: 06/22/2022: ALT 39  Recent Lipid Panel    Component Value Date/Time   CHOL 97 (L) 06/22/2022 0842   TRIG 210 (H) 06/22/2022 0842   HDL 40 06/22/2022 0842   CHOLHDL 2.4 06/22/2022 0842   CHOLHDL 5.9 (H) 11/27/2015 0801   VLDL 38 (H) 11/27/2015 0801   LDLCALC 24 06/22/2022 0842   LDLDIRECT 146 (H) 04/09/2015 0802    Home Medications   Current Meds  Medication Sig    albuterol (VENTOLIN HFA) 108 (90 Base) MCG/ACT inhaler Inhale 2 puffs into the lungs every 6 (six) hours as needed for wheezing or shortness of breath.   aspirin 81 MG tablet Take 81 mg by mouth daily.   betamethasone dipropionate 0.05 % cream Apply 1 application  topically as needed.   colestipol (COLESTID) 1 g tablet Take 1 g by mouth at bedtime.    doxazosin (CARDURA) 4 MG tablet Take 1 tablet (4 mg total) by mouth daily. Please keep January 2024 appt further refills   doxycycline (VIBRAMYCIN) 100 MG capsule Take 100 mg by mouth 2 (two) times daily. Pt. Directed to take for 10 days   EPINEPHrine (EPIPEN IJ) Inject as directed as needed (Anaphylaxis).    Evolocumab (REPATHA SURECLICK) 140 MG/ML SOAJ INJECT 1 PEN INTO THE  SKIN EVERY 14 DAYS   ezetimibe (ZETIA) 10 MG tablet TAKE 1 TABLET BY MOUTH ONCE DAILY . APPOINTMENT REQUIRED FOR FUTURE REFILLS   Ferrous Sulfate (IRON) 325 (65 Fe) MG TABS Take 1 tablet by mouth in the morning and at bedtime.   gabapentin (NEURONTIN) 300 MG capsule Take 300 mg by mouth daily.   hydrochlorothiazide (HYDRODIURIL) 25 MG tablet Take 1 tablet (25 mg total) by mouth daily.   icosapent Ethyl (VASCEPA) 1 g capsule Take 2 capsules (2 g total) by mouth 2 (two) times daily.   levothyroxine (SYNTHROID) 50 MCG tablet Take 50 mcg by mouth daily before breakfast.   lisinopril (ZESTRIL) 20 MG tablet Take 1 tablet (20 mg total) by mouth daily.   metoprolol tartrate (LOPRESSOR) 25 MG tablet Take 1/2 (one-half) tablet by mouth twice daily   Multiple Vitamin (MULTIVITAMIN) capsule Take 1 capsule by mouth daily.   nitroGLYCERIN (NITROSTAT) 0.4 MG SL tablet DISSOLVE ONE TABLET UNDER THE TONGUE EVERY 5 MINUTES AS NEEDED FOR CHEST PAIN.  DO NOT EXCEED A TOTAL OF 3 DOSES IN 15 MINUTES   omeprazole (PRILOSEC) 40 MG capsule Take 40 mg by mouth daily.   potassium chloride SA (KLOR-CON M) 20 MEQ tablet Take 1 tablet (20 mEq total) by mouth daily. Please keep appointment in January 2024 for  any more refills. Thank you   sucralfate (CARAFATE) 1 g tablet Take 1 g by mouth 2 (two) times daily.     Review of Systems      All other systems reviewed and are otherwise negative except as noted above.  Physical Exam    VS:  BP (!) 99/56   Pulse 65   Ht 5\' 9"  (1.753 m)   Wt 201 lb 6.4 oz (91.4 kg)   SpO2 94%   BMI 29.74 kg/m  , BMI Body mass index is 29.74 kg/m.  Wt Readings from Last 3 Encounters:  10/26/22 201 lb 6.4 oz (91.4 kg)  06/22/22 207 lb 6.4 oz (94.1 kg)  04/02/21 202 lb 12.8 oz (92 kg)     GEN: Well nourished, well developed, in no acute distress. HEENT: normal. Neck: Supple, no JVD, carotid bruits, or masses. Cardiac: RRR, no murmurs, rubs, or gallops. No clubbing, cyanosis, edema.  Radials/PT 2+ and equal bilaterally.  Respiratory:  Respirations regular and unlabored, clear to auscultation bilaterally. GI: Soft, nontender, nondistended. MS: No deformity or atrophy. Skin: Warm and dry, no rash. Neuro:  Strength and sensation are intact. Psych: Normal affect.  Assessment & Plan    CAD with negative stress PET/CT -Reviewed the study again with him today.  No need for ischemic workup -He has not needed to take his nitro but does endorse some chest tightness with outdoor activity -He is to call us if the tightness lasts for longer or becomes more frequent or if he is needing to use nitroglycerin -Blood pressure is too low to start Imdur -Continue current medications which include aspirin 81 mg daily, Zetia 10 mg daily, Repatha 140 mg/milliliters every 14 days, HCTZ 25 mg daily, lisinopril 20 mg daily, Lopressor 12.5 mg twice a day, nitro as needed, potassium 20 mEq daily  HTN -Blood pressure is borderline low today -Typically, per patient his blood pressure is much higher -Would recommend monitoring blood pressure at home for 1 to 2 weeks and send me those values -We could consider decreasing lisinopril to 10 mg daily if needed  Bilateral carotid artery  stenosis -Recently was checked 01/2022, will be due for  repeat ultrasound in the fall. -Mild bilateral stenosis (1 to 39%)  HLD -Labs from [place] was friendly reviewed today unfortunately, no cholesterol panel but LFTs are normal -He did have a lipid panel back in January with LDL 24, HDL 40, total cholesterol 97, and triglycerides 210 -He is already on Vascepa 2 g twice a day, consider fenofibrate at his next appointment       Disposition: Follow up 3-4 months with Armanda Magic, MD or APP.  Signed, Sharlene Dory, PA-C 10/26/2022, 9:55 AM [facility]

## 2022-10-26 ENCOUNTER — Ambulatory Visit: Payer: Medicare Other | Attending: Physician Assistant | Admitting: Physician Assistant

## 2022-10-26 ENCOUNTER — Encounter: Payer: Self-pay | Admitting: Physician Assistant

## 2022-10-26 VITALS — BP 99/56 | HR 65 | Ht 69.0 in | Wt 201.4 lb

## 2022-10-26 DIAGNOSIS — I6523 Occlusion and stenosis of bilateral carotid arteries: Secondary | ICD-10-CM | POA: Diagnosis not present

## 2022-10-26 DIAGNOSIS — I10 Essential (primary) hypertension: Secondary | ICD-10-CM | POA: Insufficient documentation

## 2022-10-26 DIAGNOSIS — E785 Hyperlipidemia, unspecified: Secondary | ICD-10-CM | POA: Diagnosis not present

## 2022-10-26 DIAGNOSIS — I2510 Atherosclerotic heart disease of native coronary artery without angina pectoris: Secondary | ICD-10-CM

## 2022-10-26 NOTE — Patient Instructions (Signed)
Medication Instructions:  Your physician recommends that you continue on your current medications as directed. Please refer to the Current Medication list given to you today.  *If you need a refill on your cardiac medications before your next appointment, please call your pharmacy*  Lab Work: None ordered If you have labs (blood work) drawn today and your tests are completely normal, you will receive your results only by: MyChart Message (if you have MyChart) OR A paper copy in the mail If you have any lab test that is abnormal or we need to change your treatment, we will call you to review the results.  Follow-Up: At [facility], you and your health needs are our priority.  As part of our continuing mission to provide you with exceptional heart care, we have created designated Provider Care Teams.  These Care Teams include your primary Cardiologist (physician) and Advanced Practice Providers (APPs -  Physician Assistants and Nurse Practitioners) who all work together to provide you with the care you need, when you need it.  Your next appointment:   6 month(s)  Provider:   Armanda Magic, MD    Other Instructions 1.Check your blood pressure daily, 1 hr after morning medications for 2 weeks, keep a log and send [place] the readings through mychart at the end of the 2 weeks. 2.Call us if you are having to use more nitroglycerin  Heart-Healthy Eating Plan Many factors influence your heart health, including eating and exercise habits. Heart health is also called coronary health. Coronary risk increases with abnormal blood fat (lipid) levels. A heart-healthy eating plan includes limiting unhealthy fats, increasing healthy fats, limiting salt (sodium) intake, and making other diet and lifestyle changes. What is my plan? Your health care provider may recommend that: You limit your fat intake to _________% or less of your total calories each day. You limit your saturated fat intake to  _________% or less of your total calories each day. You limit the amount of cholesterol in your diet to less than _________ mg per day. You limit the amount of sodium in your diet to less than _________ mg per day. What are tips for following this plan? Cooking Cook foods using methods other than frying. Baking, boiling, grilling, and broiling are all good options. Other ways to reduce fat include: Removing the skin from poultry. Removing all visible fats from meats. Steaming vegetables in water or broth. Meal planning  At meals, imagine dividing your plate into fourths: Fill one-half of your plate with vegetables and green salads. Fill one-fourth of your plate with whole grains. Fill one-fourth of your plate with lean protein foods. Eat 2-4 cups of vegetables per day. One cup of vegetables equals 1 cup (91 g) broccoli or cauliflower florets, 2 medium carrots, 1 large bell pepper, 1 large sweet potato, 1 large tomato, 1 medium white potato, 2 cups (150 g) raw leafy greens. Eat 1-2 cups of fruit per day. One cup of fruit equals 1 small apple, 1 large banana, 1 cup (237 g) mixed fruit, 1 large orange,  cup (82 g) dried fruit, 1 cup (240 mL) 100% fruit juice. Eat more foods that contain soluble fiber. Examples include apples, broccoli, carrots, beans, peas, and barley. Aim to get 25-30 g of fiber per day. Increase your consumption of legumes, nuts, and seeds to 4-5 servings per week. One serving of dried beans or legumes equals  cup (90 g) cooked, 1 serving of nuts is  oz (12 almonds, 24 pistachios, or  7 walnut halves), and 1 serving of seeds equals  oz (8 g). Fats Choose healthy fats more often. Choose monounsaturated and polyunsaturated fats, such as olive and canola oils, avocado oil, flaxseeds, walnuts, almonds, and seeds. Eat more omega-3 fats. Choose salmon, mackerel, sardines, tuna, flaxseed oil, and ground flaxseeds. Aim to eat fish at least 2 times each week. Check food labels  carefully to identify foods with trans fats or high amounts of saturated fat. Limit saturated fats. These are found in animal products, such as meats, butter, and cream. Plant sources of saturated fats include palm oil, palm kernel oil, and coconut oil. Avoid foods with partially hydrogenated oils in them. These contain trans fats. Examples are stick margarine, some tub margarines, cookies, crackers, and other baked goods. Avoid fried foods. General information Eat more home-cooked food and less restaurant, buffet, and fast food. Limit or avoid alcohol. Limit foods that are high in added sugar and simple starches such as foods made using white refined flour (white breads, pastries, sweets). Lose weight if you are overweight. Losing just 5-10% of your body weight can help your overall health and prevent diseases such as diabetes and heart disease. Monitor your sodium intake, especially if you have high blood pressure. Talk with your health care provider about your sodium intake. Try to incorporate more vegetarian meals weekly. What foods should I eat? Fruits All fresh, canned (in natural juice), or frozen fruits. Vegetables Fresh or frozen vegetables (raw, steamed, roasted, or grilled). Green salads. Grains Most grains. Choose whole wheat and whole grains most of the time. Rice and pasta, including brown rice and pastas made with whole wheat. Meats and other proteins Lean, well-trimmed beef, veal, pork, and lamb. Chicken and [place] without skin. All fish and shellfish. Wild duck, rabbit, pheasant, and venison. Egg whites or low-cholesterol egg substitutes. Dried beans, peas, lentils, and tofu. Seeds and most nuts. Dairy Low-fat or nonfat cheeses, including ricotta and mozzarella. Skim or 1% milk (liquid, powdered, or evaporated). Buttermilk made with low-fat milk. Nonfat or low-fat yogurt. Fats and oils Non-hydrogenated (trans-free) margarines. Vegetable oils, including soybean, sesame,  sunflower, olive, avocado, peanut, safflower, corn, canola, and cottonseed. Salad dressings or mayonnaise made with a vegetable oil. Beverages Water (mineral or sparkling). Coffee and tea. Unsweetened ice tea. Diet beverages. Sweets and desserts Sherbet, gelatin, and fruit ice. Small amounts of dark chocolate. Limit all sweets and desserts. Seasonings and condiments All seasonings and condiments. The items listed above may not be a complete list of foods and beverages you can eat. Contact a dietitian for more options. What foods should I avoid? Fruits Canned fruit in heavy syrup. Fruit in cream or butter sauce. Fried fruit. Limit coconut. Vegetables Vegetables cooked in cheese, cream, or butter sauce. Fried vegetables. Grains Breads made with saturated or trans fats, oils, or whole milk. Croissants. Sweet rolls. Donuts. High-fat crackers, such as cheese crackers and chips. Meats and other proteins Fatty meats, such as hot dogs, ribs, sausage, bacon, rib-eye roast or steak. High-fat deli meats, such as salami and bologna. Caviar. Domestic duck and goose. Organ meats, such as liver. Dairy Cream, sour cream, cream cheese, and creamed cottage cheese. Whole-milk cheeses. Whole or 2% milk (liquid, evaporated, or condensed). Whole buttermilk. Cream sauce or high-fat cheese sauce. Whole-milk yogurt. Fats and oils Meat fat, or shortening. Cocoa butter, hydrogenated oils, palm oil, coconut oil, palm kernel oil. Solid fats and shortenings, including bacon fat, salt pork, lard, and butter. Nondairy cream substitutes. Salad dressings with cheese or  sour cream. Beverages Regular sodas and any drinks with added sugar. Sweets and desserts Frosting. Pudding. Cookies. Cakes. Pies. Milk chocolate or white chocolate. Buttered syrups. Full-fat ice cream or ice cream drinks. The items listed above may not be a complete list of foods and beverages to avoid. Contact a dietitian for more  information. Summary Heart-healthy meal planning includes limiting unhealthy fats, increasing healthy fats, limiting salt (sodium) intake and making other diet and lifestyle changes. Lose weight if you are overweight. Losing just 5-10% of your body weight can help your overall health and prevent diseases such as diabetes and heart disease. Focus on eating a balance of foods, including fruits and vegetables, low-fat or nonfat dairy, lean protein, nuts and legumes, whole grains, and heart-healthy oils and fats. This information is not intended to replace advice given to you by your health care provider. Make sure you discuss any questions you have with your health care provider. Document Revised: 06/15/2021 Document Reviewed: 06/15/2021 Elsevier Patient Education  2024 [employer]  Low-Sodium Eating Plan Salt (sodium) helps you keep a healthy balance of fluids in your body. Too much sodium can raise your blood pressure. It can also cause fluid and waste to be held in your body. Your health care provider or dietitian may recommend a low-sodium eating plan if you have high blood pressure (hypertension), kidney disease, liver disease, or heart failure. Eating less sodium can help lower your blood pressure and reduce swelling. It can also protect your heart, liver, and kidneys. What are tips for following this plan? Reading food labels  Check food labels for the amount of sodium per serving. If you eat more than one serving, you must multiply the listed amount by the number of servings. Choose foods with less than 140 milligrams (mg) of sodium per serving. Avoid foods with 300 mg of sodium or more per serving. Always check how much sodium is in a product, even if the label says "unsalted" or "no salt added." Shopping  Buy products labeled as "low-sodium" or "no salt added." Buy fresh foods. Avoid canned foods and pre-made or frozen meals. Avoid canned, cured, or processed meats. Buy breads that  have less than 80 mg of sodium per slice. Cooking  Eat more home-cooked food. Try to eat less restaurant, buffet, and fast food. Try not to add salt when you cook. Use salt-free seasonings or herbs instead of table salt or sea salt. Check with your provider or pharmacist before using salt substitutes. Cook with plant-based oils, such as canola, sunflower, or olive oil. Meal planning When eating at a restaurant, ask if your food can be made with less salt or no salt. Avoid dishes labeled as brined, pickled, cured, or smoked. Avoid dishes made with soy sauce, miso, or teriyaki sauce. Avoid foods that have monosodium glutamate (MSG) in them. MSG may be added to some restaurant food, sauces, soups, bouillon, and canned foods. Make meals that can be grilled, baked, poached, roasted, or steamed. These are often made with less sodium. General information Try to limit your sodium intake to 1,500-2,300 mg each day, or the amount told by your provider. What foods should I eat? Fruits Fresh, frozen, or canned fruit. Fruit juice. Vegetables Fresh or frozen vegetables. "No salt added" canned vegetables. "No salt added" tomato sauce and paste. Low-sodium or reduced-sodium tomato and vegetable juice. Grains Low-sodium cereals, such as oats, puffed wheat and rice, and shredded wheat. Low-sodium crackers. Unsalted rice. Unsalted pasta. Low-sodium bread. Whole grain breads and whole  grain pasta. Meats and other proteins Fresh or frozen meat, poultry, seafood, and fish. These should have no added salt. Low-sodium canned tuna and salmon. Unsalted nuts. Dried peas, beans, and lentils without added salt. Unsalted canned beans. Eggs. Unsalted nut butters. Dairy Milk. Soy milk. Cheese that is naturally low in sodium, such as ricotta cheese, fresh mozzarella, or Swiss cheese. Low-sodium or reduced-sodium cheese. Cream cheese. Yogurt. Seasonings and condiments Fresh and dried herbs and spices. Salt-free seasonings.  Low-sodium mustard and ketchup. Sodium-free salad dressing. Sodium-free light mayonnaise. Fresh or refrigerated horseradish. Lemon juice. Vinegar. Other foods Homemade, reduced-sodium, or low-sodium soups. Unsalted popcorn and pretzels. Low-salt or salt-free chips. The items listed above may not be all the foods and drinks you can have. Talk to a dietitian to learn more. What foods should I avoid? Vegetables Sauerkraut, pickled vegetables, and relishes. Olives. [place] fries. Onion rings. Regular canned vegetables, except low-sodium or reduced-sodium items. Regular canned tomato sauce and paste. Regular tomato and vegetable juice. Frozen vegetables in sauces. Grains Instant hot cereals. Bread stuffing, pancake, and biscuit mixes. Croutons. Seasoned rice or pasta mixes. Noodle soup cups. Boxed or frozen macaroni and cheese. Regular salted crackers. Self-rising flour. Meats and other proteins Meat or fish that is salted, canned, smoked, spiced, or pickled. Precooked or cured meat, such as sausages or meat loaves. Tomasa Blase. Ham. Pepperoni. Hot dogs. Corned beef. Chipped beef. Salt pork. Jerky. Pickled herring, anchovies, and sardines. Regular canned tuna. Salted nuts. Dairy Processed cheese and cheese spreads. Hard cheeses. Cheese curds. Blue cheese. Feta cheese. String cheese. Regular cottage cheese. Buttermilk. Canned milk. Fats and oils Salted butter. Regular margarine. Ghee. Bacon fat. Seasonings and condiments Onion salt, garlic salt, seasoned salt, table salt, and sea salt. Canned and packaged gravies. Worcestershire sauce. Tartar sauce. Barbecue sauce. Teriyaki sauce. Soy sauce, including reduced-sodium soy sauce. Steak sauce. Fish sauce. Oyster sauce. Cocktail sauce. Horseradish that you find on the shelf. Regular ketchup and mustard. Meat flavorings and tenderizers. Bouillon cubes. Hot sauce. Pre-made or packaged marinades. Pre-made or packaged taco seasonings. Relishes. Regular salad dressings.  Salsa. Other foods Salted popcorn and pretzels. Corn chips and puffs. Potato and tortilla chips. Canned or dried soups. Pizza. Frozen entrees and pot pies. The items listed above may not be all the foods and drinks you should avoid. Talk to a dietitian to learn more. This information is not intended to replace advice given to you by your health care provider. Make sure you discuss any questions you have with your health care provider. Document Revised: 05/27/2022 Document Reviewed: 05/27/2022 Elsevier Patient Education  2024 [employer].

## 2022-11-02 DIAGNOSIS — M25512 Pain in left shoulder: Secondary | ICD-10-CM | POA: Insufficient documentation

## 2022-11-03 DIAGNOSIS — M25812 Other specified joint disorders, left shoulder: Secondary | ICD-10-CM | POA: Diagnosis not present

## 2022-11-03 DIAGNOSIS — M25511 Pain in right shoulder: Secondary | ICD-10-CM | POA: Diagnosis not present

## 2022-11-08 ENCOUNTER — Telehealth: Payer: Self-pay | Admitting: *Deleted

## 2022-11-08 NOTE — Telephone Encounter (Signed)
   Pre-operative Risk Assessment    Patient Name: Kenneth Poole  DOB: 28-Aug-1945 MRN: [id number]      Request for Surgical Clearance    Procedure:   LEFT REVERSE SHOULDER ARTHROPLASTY  Date of Surgery:  Clearance TBD                                 Surgeon:  DR. Caryn Bee SUPPLE Surgeon's Group or Practice Name:  [facility] Phone number:  [phone] ATTN: [name] Fax number:  [phone]   Type of Clearance Requested:   - Medical ; ASA    Type of Anesthesia:  General    Additional requests/questions:    [name]   11/08/2022, 3:08 PM

## 2022-11-11 ENCOUNTER — Telehealth: Payer: Self-pay | Admitting: *Deleted

## 2022-11-11 NOTE — Telephone Encounter (Signed)
   Name: Kenneth Poole  DOB: June 23, 1945  MRN: [id number]  Primary Cardiologist: Armanda Magic, MD   Preoperative team, please contact this patient and set up a phone call appointment for further preoperative risk assessment. Please obtain consent and complete medication review. Thank you for your help.  His aspirin may be held for 5 to 7 days prior to his procedure.  Please resume as soon as hemostasis is achieved.  I confirm that guidance regarding antiplatelet and oral anticoagulation therapy has been completed and, if necessary, noted below.     Ronney Asters, NP 11/11/2022, 9:16 AM [facility]

## 2022-11-11 NOTE — Telephone Encounter (Signed)
Pt has been scheduled for tele pre op appt 11/17/22 @ 9 am. Med rec and consent are done.     Patient Consent for Virtual Visit        [name] has provided verbal consent on 11/11/2022 for a virtual visit (video or telephone).   CONSENT FOR VIRTUAL VISIT FOR:  Kenneth Poole  By participating in this virtual visit I agree to the following:  I hereby voluntarily request, consent and authorize [facility] and its employed or contracted physicians, physician assistants, nurse practitioners or other licensed health care professionals (the Practitioner), to provide me with telemedicine health care services (the "Services") as deemed necessary by the treating Practitioner. I acknowledge and consent to receive the Services by the Practitioner via telemedicine. I understand that the telemedicine visit will involve communicating with the Practitioner through live audiovisual communication technology and the disclosure of certain medical information by electronic transmission. I acknowledge that I have been given the opportunity to request an in-person assessment or other available alternative prior to the telemedicine visit and am voluntarily participating in the telemedicine visit.  I understand that I have the right to withhold or withdraw my consent to the use of telemedicine in the course of my care at any time, without affecting my right to future care or treatment, and that the Practitioner or I may terminate the telemedicine visit at any time. I understand that I have the right to inspect all information obtained and/or recorded in the course of the telemedicine visit and may receive copies of available information for a reasonable fee.  I understand that some of the potential risks of receiving the Services via telemedicine include:  Delay or interruption in medical evaluation due to technological equipment failure or disruption; Information transmitted may not be sufficient  (e.g. poor resolution of images) to allow for appropriate medical decision making by the Practitioner; and/or  In rare instances, security protocols could fail, causing a breach of personal health information.  Furthermore, I acknowledge that it is my responsibility to provide information about my medical history, conditions and care that is complete and accurate to the best of my ability. I acknowledge that Practitioner's advice, recommendations, and/or decision may be based on factors not within their control, such as incomplete or inaccurate data provided by me or distortions of diagnostic images or specimens that may result from electronic transmissions. I understand that the practice of medicine is not an exact science and that Practitioner makes no warranties or guarantees regarding treatment outcomes. I acknowledge that a copy of this consent can be made available to me via my patient portal [facility] MyChart), or I can request a printed copy by calling the office of [facility].    I understand that my insurance will be billed for this visit.   I have read or had this consent read to me. I understand the contents of this consent, which adequately explains the benefits and risks of the Services being provided via telemedicine.  I have been provided ample opportunity to ask questions regarding this consent and the Services and have had my questions answered to my satisfaction. I give my informed consent for the services to be provided through the use of telemedicine in my medical care

## 2022-11-11 NOTE — Telephone Encounter (Signed)
Pt has been scheduled for tele pre op appt 11/17/22 @ 9 am. Med rec and consent are done.

## 2022-11-17 ENCOUNTER — Ambulatory Visit: Payer: Medicare Other | Attending: Cardiology

## 2022-11-17 DIAGNOSIS — Z01810 Encounter for preprocedural cardiovascular examination: Secondary | ICD-10-CM | POA: Diagnosis not present

## 2022-11-17 NOTE — Progress Notes (Signed)
Virtual Visit via Telephone Note   Because of Kenneth Hattabaugh Suder's co-morbid illnesses, he is at least at moderate risk for complications without adequate follow up.  This format is felt to be most appropriate for this patient at this time.  The patient did not have access to video technology/had technical difficulties with video requiring transitioning to audio format only (telephone).  All issues noted in this document were discussed and addressed.  No physical exam could be performed with this format.  Please refer to the patient's chart for his consent to telehealth for [facility].  Evaluation Performed:  Preoperative cardiovascular risk assessment _____________   Date:  11/17/2022   Patient ID:  Kenneth Poole, DOB 11-Aug-1945, MRN [id number] Patient Location:  Home Provider location:   Office  Primary Care Provider:  Soundra Pilon, FNP Primary Cardiologist:  Armanda Magic, MD  Chief Complaint / Patient Profile   77 y.o. y/o male with a h/o CAD status post PTCA of circumflex lesion in 2004 and cardiac cath June 2009 with 50% LAD stenosis, hypertension, hyperlipidemia  who is pending left shoulder arthroplasty and presents today for telephonic preoperative cardiovascular risk assessment.  History of Present Illness    Kenneth Poole is a 77 y.o. male who presents via audio/video conferencing for a telehealth visit today.  Pt was last seen in cardiology clinic on 10/26/2022 by Kenneth Favre, PA .At that time Kenneth Poole was doing well but had complained of chest discomfort following meals and sometimes without eating. The patient is now pending procedure as outlined above. Since his last visit, he reports doing well with no recurrence of chest discomfort.  He felt that this was mostly related to anxiety due to his upcoming procedure.   Past Medical History    Past Medical History:  Diagnosis Date   Back pain    Carotid artery disease (HCC)    1-39% bilateral  carotid stenosis  by dopplers 01/2022   Coronary artery disease    s.p PTCA of circumflex lesion in 2004, cardiologist Dr Mayford Knife, nonobstructive CAD on CATH in June 2009 with 50% LAD   Dermatitis    Diverticulosis    Dyslipidemia    GERD (gastroesophageal reflux disease)    Hypercholesteremia    Hypercholesteremia    Hypertension    Obesity    Peripheral neuropathy    Psoriasis    Urticaria    Past Surgical History:  Procedure Laterality Date   APPENDECTOMY     BACK SURGERY     CARDIAC CATHETERIZATION  June 2009   CHOLECYSTECTOMY     CORONARY STENT PLACEMENT     KIDNEY STONE SURGERY     TONSILLECTOMY      Allergies  Allergies  Allergen Reactions   Apple Cider Vinegar Anaphylaxis, Other (See Comments) and Shortness Of Breath    No organic    Bee Pollen Anaphylaxis, Swelling and Rash   Erythromycin Base Anaphylaxis, Hives and Swelling    To any medication that has mycin ANY MED IN THE MYCIN FAMILY   Other Anaphylaxis    ORGANIC VINEGAR - severe hypotension   Rosuvastatin Calcium Other (See Comments)    Severe myalgias on 5mg  twice weekly   Sulfa Antibiotics Other (See Comments)   Tetracycline Swelling    Swelling in hands and face   Ace Inhibitors    Atorvastatin     Myalgias on 10-20mg     Bee Venom Swelling   Fenofibrate Other (See Comments)  Muscle aches    Livalo [Pitavastatin]     Myalgias on 1mg    Simcor [Niacin-Simvastatin Er]     Elevated CPK    Home Medications    Prior to Admission medications   Medication Sig Start Date End Date Taking? Authorizing Provider  albuterol (VENTOLIN HFA) 108 (90 Base) MCG/ACT inhaler Inhale 2 puffs into the lungs every 6 (six) hours as needed for wheezing or shortness of breath.    [provider]  aspirin 81 MG tablet Take 81 mg by mouth daily.    [provider]  betamethasone dipropionate 0.05 % cream Apply 1 application  topically as needed.    [provider]  colestipol (COLESTID) 1 g  tablet Take 1 g by mouth at bedtime.  07/30/19   [provider]  doxazosin (CARDURA) 4 MG tablet Take 1 tablet (4 mg total) by mouth daily. Please keep January 2024 appt further refills 06/22/22   Quintella Reichert, MD  doxycycline (VIBRAMYCIN) 100 MG capsule Take 100 mg by mouth 2 (two) times daily. Pt. Directed to take for 10 days 10/20/22   [provider]  EPINEPHrine (EPIPEN IJ) Inject as directed as needed (Anaphylaxis).     [provider]  Evolocumab (REPATHA SURECLICK) 140 MG/ML SOAJ INJECT 1 PEN INTO THE SKIN EVERY 14 DAYS 05/10/22   Quintella Reichert, MD  ezetimibe (ZETIA) 10 MG tablet TAKE 1 TABLET BY MOUTH ONCE DAILY . APPOINTMENT REQUIRED FOR FUTURE REFILLS 07/27/22   Quintella Reichert, MD  Ferrous Sulfate (IRON) 325 (65 Fe) MG TABS Take 1 tablet by mouth in the morning and at bedtime.    [provider]  gabapentin (NEURONTIN) 300 MG capsule Take 300 mg by mouth daily. 12/22/20   [provider]  hydrochlorothiazide (HYDRODIURIL) 25 MG tablet Take 1 tablet (25 mg total) by mouth daily. 06/22/22   Quintella Reichert, MD  icosapent Ethyl (VASCEPA) 1 g capsule Take 2 capsules (2 g total) by mouth 2 (two) times daily. 06/22/22   Quintella Reichert, MD  levothyroxine (SYNTHROID) 50 MCG tablet Take 50 mcg by mouth daily before breakfast.    [provider]  lisinopril (ZESTRIL) 20 MG tablet Take 1 tablet (20 mg total) by mouth daily. 06/22/22   Quintella Reichert, MD  metoprolol tartrate (LOPRESSOR) 25 MG tablet Take 1/2 (one-half) tablet by mouth twice daily 06/30/22   Quintella Reichert, MD  Multiple Vitamin (MULTIVITAMIN) capsule Take 1 capsule by mouth daily.    [provider]  nitroGLYCERIN (NITROSTAT) 0.4 MG SL tablet DISSOLVE ONE TABLET UNDER THE TONGUE EVERY 5 MINUTES AS NEEDED FOR CHEST PAIN.  DO NOT EXCEED A TOTAL OF 3 DOSES IN 15 MINUTES 04/21/21   Turner, Cornelious Bryant, MD  omeprazole (PRILOSEC) 40 MG capsule Take 40 mg by mouth daily. 08/21/19    [provider]  potassium chloride SA (KLOR-CON M) 20 MEQ tablet Take 1 tablet (20 mEq total) by mouth daily. Please keep appointment in January 2024 for any more refills. Thank you 06/22/22   Quintella Reichert, MD  sucralfate (CARAFATE) 1 g tablet Take 1 g by mouth 2 (two) times daily. 08/19/22   [provider]    Physical Exam    Vital Signs:  KASSIM GUERTIN does not have vital signs available for review today.  Given telephonic nature of communication, physical exam is limited. AAOx3. NAD. Normal affect.  Speech and respirations are unlabored.  Accessory Clinical Findings  None  Assessment & Plan    1.  Preoperative Cardiovascular Risk Assessment:  -Patient's RCRI score is 0.9%  The patient affirms he has been doing well without any new cardiac symptoms. They are able to achieve 6 METS without cardiac limitations. Therefore, based on ACC/AHA guidelines, the patient would be at acceptable risk for the planned procedure without further cardiovascular testing. The patient was advised that if he develops new symptoms prior to surgery to contact our office to arrange for a follow-up visit, and he verbalized understanding.   The patient was advised that if he develops new symptoms prior to surgery to contact our office to arrange for a follow-up visit, and he verbalized understanding.  Patient advised to hold aspirin 7 days prior to procedure and should restart postprocedure when surgically safe.  A copy of this note will be routed to requesting surgeon.  Time:   Today, I have spent 7 minutes with the patient with telehealth technology discussing medical history, symptoms, and management plan.     Napoleon Form, Leodis Rains, NP  11/17/2022, 6:49 AM

## 2022-11-21 NOTE — Progress Notes (Signed)
COVID Vaccine received:  []  No [x]  Yes Date of any COVID positive Test in last 90 days:  PCP - Peri Maris, FNP   clearance on chart  [phone] Cardiologist - Armanda Magic, MD,  Robin Searing, NP  cardiac clearance in 11-17-22 note  Chest x-ray - 04-10-2020  2v  Epic EKG -  06-22-2022  Epic Stress Test -  ECHO - 05-21-2004  Epic Cardiac Cath - x 2,  2004, 2009  DESx1  PCR screen: [x]  Ordered & Completed           []   No Order but Needs PROFEND           []   N/A for this surgery  Surgery Plan:  [x]  Ambulatory                            []  Outpatient in bed                            []  Admit  Anesthesia:    [x]  General  []  Spinal                           []   Choice []   MAC  Pacemaker / ICD device [x]  No []  Yes   Spinal Cord Stimulator:[x]  No []  Yes       History of Sleep Apnea? [x]  No []  Yes   CPAP used?- [x]  No []  Yes    Does the patient monitor blood sugar?          []  No []  Yes  [x]  N/A  Patient has: [x]  NO Hx DM   []  Pre-DM                 []  DM1  []   DM2 Does patient have a [employer] or Dexacom? []  No []  Yes   Fasting Blood Sugar Ranges-  Checks Blood Sugar _____ times a day  Blood Thinner / Instructions:none Aspirin Instructions:  ASA 81mg   ERAS Protocol Ordered: []  No  [x]  Yes PRE-SURGERY [x]  ENSURE  []  G2  Patient is to be NPO after:   Comments: Patient was given the 5 CHG shower / bath instructions for Reverse Shoulder arthroplasty surgery along with 2 bottles of the CHG soap. Patient will start this on: Monday  11-29-2022  All questions were asked and answered, Patient voiced understanding of this process.   The patient was given Benzoyl peroxide Gel as ordered. Instruction regarding application starting 2 days prior to surgery was given and patient voiced understanding.   Activity level: Patient is able / unable to climb a flight of stairs without difficulty; []  No CP  []  No SOB, but would have ___   Patient can / can not perform ADLs without  assistance.   Anesthesia review: CAD- Caths x2- DES x 1, GERD, HTN, PVD and mild carotid stenosis.  Patient denies shortness of breath, fever, cough and chest pain at PAT appointment.  Patient verbalized understanding and agreement to the Pre-Surgical Instructions that were given to them at this PAT appointment. Patient was also educated of the need to review these PAT instructions again prior to his surgery.I reviewed the appropriate phone numbers to call if they have any and questions or concerns.

## 2022-11-21 NOTE — Patient Instructions (Signed)
SURGICAL WAITING ROOM VISITATION Patients having surgery or a procedure may have no more than 2 support people in the waiting area - these visitors may rotate in the visitor waiting room.   Due to an increase in RSV and influenza rates and associated hospitalizations, children ages 27 and under may not visit patients in [facility] hospitals. If the patient needs to stay at the hospital during part of their recovery, the visitor guidelines for inpatient rooms apply.  PRE-OP VISITATION  Pre-op nurse will coordinate an appropriate time for 1 support person to accompany the patient in pre-op.  This support person may not rotate.  This visitor will be contacted when the time is appropriate for the visitor to come back in the pre-op area.  Please refer to the [facility] website for the visitor guidelines for Inpatients (after your surgery is over and you are in a regular room).  You are not required to quarantine at this time prior to your surgery. However, you must do this: Hand Hygiene often Do NOT share personal items Notify your provider if you are in close contact with someone who has COVID or you develop fever 100.4 or greater, new onset of sneezing, cough, sore throat, shortness of breath or body aches.  If you test positive for Covid or have been in contact with anyone that has tested positive in the last 10 days please notify you surgeon.    Your procedure is scheduled on:  THURSDAY  December 02, 2022  Report to [facility] Main Entrance: [facility] [facility] entrance where the [employer] is available.   Report to admitting at: 12:45 PM  Call this number if you have any questions or problems the morning of surgery [phone]  Do not eat food after Midnight the night prior to your surgery/procedure.  After Midnight you may have the following liquids until  12:15 PM DAY OF SURGERY  Clear Liquid Diet Water Black Coffee (sugar ok, NO MILK/CREAM OR CREAMERS)  Tea (sugar ok, NO  MILK/CREAM OR CREAMERS) regular and decaf                             Plain Jell-O  with no fruit (NO RED)                                           Fruit ices (not with fruit pulp, NO RED)                                     Popsicles (NO RED)                                                                  Juice: NO CITRUS JUICES: only apple, WHITE grape, WHITE cranberry Sports drinks like Gatorade or Powerade (NO RED)                   The day of surgery:  Drink ONE (1) Pre-Surgery Clear Ensure at  12:15  AM the morning of surgery. Drink in one  sitting. Do not sip.  This drink was given to you during your hospital pre-op appointment visit. Nothing else to drink after completing the Pre-Surgery Clear Ensure  : No candy, chewing gum or throat lozenges.    FOLLOW  ANY ADDITIONAL PRE OP INSTRUCTIONS YOU RECEIVED FROM YOUR SURGEON'S OFFICE!!!   Oral Hygiene is also important to reduce your risk of infection.        Remember - BRUSH YOUR TEETH THE MORNING OF SURGERY WITH YOUR REGULAR TOOTHPASTE  Do NOT smoke after Midnight the night before surgery.  Take ONLY these medicines the morning of surgery with A SIP OF WATER: Levothyroxine (Synthroid), metoprolol, doxazosin (Cardura), omeprazole (Prilosec)   You may not have any metal on your body including jewelry, and body piercing  Do not wear  lotions, powders, cologne, or deodorant  Men may shave face and neck.  Contacts, Hearing Aids, dentures or bridgework may not be worn into surgery. DENTURES WILL BE REMOVED PRIOR TO SURGERY PLEASE DO NOT APPLY "Poly grip" OR ADHESIVES!!!  You may bring a small overnight bag with you on the day of surgery, only pack items that are not valuable. [facility] IS NOT RESPONSIBLE   FOR VALUABLES THAT ARE LOST OR STOLEN.   Patients discharged on the day of surgery will not be allowed to drive home.  Someone NEEDS to stay with you for the first 24 hours after anesthesia.  Do not bring your home  medications to the hospital. The Pharmacy will dispense medications listed on your medication list to you during your admission in the Hospital.  Please read over the following fact sheets you were given: IF YOU HAVE QUESTIONS ABOUT YOUR PRE-OP INSTRUCTIONS, PLEASE CALL [phone].        Pre-operative 5 CHG Bath Instructions   You can play a key role in reducing the risk of infection after surgery. Your skin needs to be as free of germs as possible. You can reduce the number of germs on your skin by washing with CHG (chlorhexidine gluconate) soap before surgery. CHG is an antiseptic soap that kills germs and continues to kill germs even after washing.   DO NOT use if you have an allergy to chlorhexidine/CHG or antibacterial soaps. If your skin becomes reddened or irritated, stop using the CHG and notify one of our RNs at [phone]  Please shower with the CHG soap starting 4 days before surgery using the following schedule: START SHOWERS ON  MONDAY November 29, 2022  Please keep in mind the following:  DO NOT shave, including legs and underarms, starting the day of your first shower.   You may shave your face at any point before/day of surgery.   Place clean sheets on your bed the day you start using CHG soap. Use a clean washcloth (not used since being washed) for each shower. DO NOT sleep with pets once you start using the CHG.   CHG Shower Instructions:  If you choose to wash your hair and private area, wash first with your normal shampoo/soap.  After you use shampoo/soap, rinse your hair and body thoroughly to remove shampoo/soap residue.  Turn the water OFF and apply about 3 tablespoons (45 ml) of CHG soap to a CLEAN washcloth.  Apply CHG soap ONLY FROM YOUR NECK DOWN TO YOUR TOES (washing for 3-5  minutes)  DO NOT use CHG soap on face, private areas, open wounds, or sores.  Pay special attention to the area where your surgery is being performed.  If you are having back surgery, having someone wash your back for you may be helpful.  Wait 2 minutes after CHG soap is applied, then you may rinse off the CHG soap.  Pat dry with a clean towel  Put on clean clothes/pajamas   If you choose to wear lotion, please use ONLY the CHG-compatible lotions on the back of this paper.     Additional instructions for the day of surgery: DO NOT APPLY any lotions, deodorants, cologne, or perfumes.   Put on clean/comfortable clothes.  Brush your teeth.  Ask your nurse before applying any prescription medications to the skin.      CHG Compatible Lotions   Aveeno Moisturizing lotion  Cetaphil Moisturizing Cream  Cetaphil Moisturizing Lotion  Clairol Herbal Essence Moisturizing Lotion, Dry Skin  Clairol Herbal Essence Moisturizing Lotion, Extra Dry Skin  Clairol Herbal Essence Moisturizing Lotion, Normal Skin  Curel Age Defying Therapeutic Moisturizing Lotion with Alpha Hydroxy  Curel Extreme Care Body Lotion  Curel Soothing Hands Moisturizing Hand Lotion  Curel Therapeutic Moisturizing Cream, Fragrance-Free  Curel Therapeutic Moisturizing Lotion, Fragrance-Free  Curel Therapeutic Moisturizing Lotion, Original Formula  Eucerin Daily Replenishing Lotion  Eucerin Dry Skin Therapy Plus Alpha Hydroxy Crme  Eucerin Dry Skin Therapy Plus Alpha Hydroxy Lotion  Eucerin Original Crme  Eucerin Original Lotion  Eucerin Plus Crme Eucerin Plus Lotion  Eucerin TriLipid Replenishing Lotion  Keri Anti-Bacterial Hand Lotion  Keri Deep Conditioning Original Lotion Dry Skin Formula Softly Scented  Keri Deep Conditioning Original Lotion, Fragrance Free Sensitive Skin Formula  Keri Lotion Fast Absorbing Fragrance Free Sensitive Skin Formula  Keri Lotion Fast Absorbing Softly Scented Dry Skin Formula  Keri  Original Lotion  Keri Skin Renewal Lotion Keri Silky Smooth Lotion  Keri Silky Smooth Sensitive Skin Lotion  Nivea Body Creamy Conditioning Oil  Nivea Body Extra Enriched Lotion  Nivea Body Original Lotion  Nivea Body Sheer Moisturizing Lotion Nivea Crme  Nivea Skin Firming Lotion  NutraDerm 30 Skin Lotion  NutraDerm Skin Lotion  NutraDerm Therapeutic Skin Cream  NutraDerm Therapeutic Skin Lotion  ProShield Protective Hand Cream  Provon moisturizing lotion      Preparing for Total Shoulder Arthroplasty ================================================================= Please follow these instructions carefully, in addition to any other special Bathing information that was explained to you at the Presurgical Appointment:  BENZOYL PEROXIDE 5% GEL: Used to kill bacteria on the skin which could cause an infection at the surgery site.   Please do not use if you have  an allergy to benzoyl peroxide. If your skin becomes reddened/irritated stop using the benzoyl peroxide and inform your Doctor.   Starting two days before surgery, apply as follows:  1. Apply benzoyl peroxide gel in the morning and at night. Apply after taking a shower. If you are not taking a shower, clean entire shoulder front, back, and side, along with the armpit with a clean wet washcloth.  2. Place a quarter-sized dollop of the gel on your SHOULDER and rub in thoroughly, making sure to cover the front, back, and side of your shoulder, along with the armpit.   2 Days prior to Surgery   TUESDAY  November 30, 2022 First Application _______ Morning Second Application _______ Night  Day Before Surgery    [facility]  December 01, 2022 First Application______ Morning  On the night before surgery, wash your entire body (except hair, face and private areas) with CHG Soap. THEN, rub in the LAST application of the Benzoyl Peroxide Gel on your shoulder.   3. On the Morning of Surgery wash your BODY AGAIN with CHG Soap (except hair,  face and private areas)  4. DO NOT USE THE BENZOYL PEROXIDE GEL ON THE DAY OF YOUR SURGERY       FAILURE TO FOLLOW THESE INSTRUCTIONS MAY RESULT IN THE CANCELLATION OF YOUR SURGERY  PATIENT SIGNATURE_________________________________  NURSE SIGNATURE__________________________________  ________________________________________________________________________     Kenneth Poole    An incentive spirometer is a tool that can help keep your lungs clear and active. This tool measures how well you are filling your lungs with each breath. Taking long deep breaths may help reverse or decrease the chance of developing breathing (pulmonary) problems (especially infection) following: A long period of time when you are unable to move or be active. BEFORE THE PROCEDURE  If the spirometer includes an indicator to show your best effort, your nurse or respiratory therapist will set it to a desired goal. If possible, sit up straight or lean slightly forward. Try not to slouch. Hold the incentive spirometer in an upright position. INSTRUCTIONS FOR USE  Sit on the edge of your bed if possible, or sit up as far as you can in bed or on a chair. Hold the incentive spirometer in an upright position. Breathe out normally. Place the mouthpiece in your mouth and seal your lips tightly around it. Breathe in slowly and as deeply as possible, raising the piston or the ball toward the top of the column. Hold your breath for 3-5 seconds or for as long as possible. Allow the piston or ball to fall to the bottom of the column. Remove the mouthpiece from your mouth and breathe out normally. Rest for a few seconds and repeat Steps 1 through 7 at least 10 times every 1-2 hours when you are awake. Take your time and take a few normal breaths between deep breaths. The spirometer may include an indicator to show your best effort. Use the indicator as a goal to work toward during each repetition. After each set of  10 deep breaths, practice coughing to be sure your lungs are clear. If you have an incision (the cut made at the time of surgery), support your incision when coughing by placing a pillow or rolled up towels firmly against it. Once you are able to get out of bed, walk around indoors and cough well. You may stop using the incentive spirometer when instructed by your caregiver.  RISKS AND COMPLICATIONS Take your time so you do not  get dizzy or light-headed. If you are in pain, you may need to take or ask for pain medication before doing incentive spirometry. It is harder to take a deep breath if you are having pain. AFTER USE Rest and breathe slowly and easily. It can be helpful to keep track of a log of your progress. Your caregiver can provide you with a simple table to help with this. If you are using the spirometer at home, follow these instructions: SEEK MEDICAL CARE IF:  You are having difficultly using the spirometer. You have trouble using the spirometer as often as instructed. Your pain medication is not giving enough relief while using the spirometer. You develop fever of 100.5 F (38.1 C) or higher.                                                                                                    SEEK IMMEDIATE MEDICAL CARE IF:  You cough up bloody sputum that had not been present before. You develop fever of 102 F (38.9 C) or greater. You develop worsening pain at or near the incision site. MAKE SURE YOU:  Understand these instructions. Will watch your condition. Will get help right away if you are not doing well or get worse. Document Released: 09/20/2006 Document Revised: 08/02/2011 Document Reviewed: 11/21/2006 [facility] Patient Information 2014 [place], [place].

## 2022-11-22 ENCOUNTER — Encounter (HOSPITAL_COMMUNITY)
Admission: RE | Admit: 2022-11-22 | Discharge: 2022-11-22 | Disposition: A | Discharge status: Home / Self Care | Payer: Medicare Other | Source: Ambulatory Visit | Attending: Orthopedic Surgery | Admitting: Orthopedic Surgery

## 2022-11-22 ENCOUNTER — Encounter (HOSPITAL_COMMUNITY): Payer: Self-pay

## 2022-11-22 ENCOUNTER — Other Ambulatory Visit: Payer: Self-pay

## 2022-11-22 VITALS — BP 113/64 | HR 64 | Temp 98.9°F | Resp 18 | Ht 69.0 in | Wt 198.0 lb

## 2022-11-22 DIAGNOSIS — Z01818 Encounter for other preprocedural examination: Secondary | ICD-10-CM | POA: Diagnosis not present

## 2022-11-22 DIAGNOSIS — I2510 Atherosclerotic heart disease of native coronary artery without angina pectoris: Secondary | ICD-10-CM | POA: Diagnosis not present

## 2022-11-22 HISTORY — DX: Peripheral vascular disease, unspecified: I73.9

## 2022-11-22 HISTORY — DX: Personal history of urinary calculi: Z87.442

## 2022-11-22 HISTORY — DX: Unspecified osteoarthritis, unspecified site: M19.90

## 2022-11-22 LAB — BASIC METABOLIC PANEL
Anion gap: 10 (ref 5–15)
BUN: 16 mg/dL (ref 8–23)
CO2: 23 mmol/L (ref 22–32)
Calcium: 9.1 mg/dL (ref 8.9–10.3)
Chloride: 105 mmol/L (ref 98–111)
Creatinine, Ser: 0.86 mg/dL (ref 0.61–1.24)
GFR, Estimated: >60 mL/min (ref >60)
Glucose, Bld: 122 mg/dL — ABNORMAL HIGH (ref 70–99)
Potassium: 4.2 mmol/L (ref 3.5–5.1)
Sodium: 138 mmol/L (ref 135–145)

## 2022-11-22 LAB — CBC
HCT: 34.4 % — ABNORMAL LOW (ref 39.0–52.0)
Hemoglobin: 11.8 g/dL — ABNORMAL LOW (ref 13.0–17.0)
MCH: 29.6 pg (ref 26.0–34.0)
MCHC: 34.3 g/dL (ref 30.0–36.0)
MCV: 86.2 fL (ref 80.0–100.0)
Platelets: 153 10*3/uL (ref 150–400)
RBC: 3.99 MIL/uL — ABNORMAL LOW (ref 4.22–5.81)
RDW: 13.4 % (ref 11.5–15.5)
WBC: 7.8 10*3/uL (ref 4.0–10.5)
nRBC: 0 % (ref 0.0–0.2)

## 2022-11-22 LAB — SURGICAL PCR SCREEN
MRSA, PCR: NEGATIVE
Staphylococcus aureus: NEGATIVE

## 2022-11-23 ENCOUNTER — Inpatient Hospital Stay (HOSPITAL_COMMUNITY): Admission: RE | Admit: 2022-11-23 | Payer: Medicare Other | Source: Ambulatory Visit

## 2022-11-23 NOTE — Anesthesia Preprocedure Evaluation (Addendum)
Anesthesia Evaluation  Patient identified by MRN, date of birth, ID band Patient awake    Reviewed: Allergy & Precautions, NPO status , Patient's Chart, lab work & pertinent test results, reviewed documented beta blocker date and time   Airway Mallampati: II  TM Distance: >3 FB Neck ROM: Full    Dental  (+) Edentulous Upper, Edentulous Lower, Upper Dentures, Lower Dentures, Dental Advisory Given   Pulmonary former smoker   Pulmonary exam normal breath sounds clear to auscultation       Cardiovascular hypertension, Pt. on home beta blockers and Pt. on medications + CAD, + Cardiac Stents and + Peripheral Vascular Disease  Normal cardiovascular exam Rhythm:Regular Rate:Normal  Stress Test 2018 normal   Neuro/Psych negative neurological ROS  negative psych ROS   GI/Hepatic Neg liver ROS,GERD  Poorly Controlled,,  Endo/Other  negative endocrine ROS    Renal/GU negative Renal ROS  negative genitourinary   Musculoskeletal negative musculoskeletal ROS (+)    Abdominal   Peds  Hematology negative hematology ROS (+)   Anesthesia Other Findings   Reproductive/Obstetrics                             Anesthesia Physical Anesthesia Plan  ASA: 3  Anesthesia Plan: General and Regional   Post-op Pain Management: Regional block* and Tylenol PO (pre-op)*   Induction: Intravenous and Rapid sequence  PONV Risk Score and Plan: 2 and Dexamethasone, Ondansetron and Treatment may vary due to age or medical condition  Airway Management Planned: Oral ETT  Additional Equipment:   Intra-op Plan:   Post-operative Plan: Extubation in OR  Informed Consent: I have reviewed the patients History and Physical, chart, labs and discussed the procedure including the risks, benefits and alternatives for the proposed anesthesia with the patient or authorized representative who has indicated his/her understanding and  acceptance.     Dental advisory given  Plan Discussed with: CRNA  Anesthesia Plan Comments: (See PAT note 11/22/2022)       Anesthesia Quick Evaluation

## 2022-11-23 NOTE — Progress Notes (Signed)
Anesthesia Chart Review   Case: [id number] Date/Time: 12/02/22 1215   Procedure: REVERSE SHOULDER ARTHROPLASTY (Left: Shoulder) - ; please move up to follow if room opens at 12:30pm   Anesthesia type: General   Pre-op diagnosis: Left shoulder rotator cuff tear arthropathy   Location: [facility] 06 / [facility]   Surgeons: Francena Hanly, MD       DISCUSSION:77 y.o. former smoker with h/o HTN, GERD, PVD, CAD status post PTCA of circumflex lesion in 2004 and cardiac cath June 2009 with 50% LAD stenosis, left shoulder OA scheduled for above procedure 12/02/2022 with Dr. Francena Hanly.   Pt last seen by cardiology 11/17/2022.  Per OV note, "The patient affirms he has been doing well without any new cardiac symptoms. They are able to achieve 6 METS without cardiac limitations. Therefore, based on ACC/AHA guidelines, the patient would be at acceptable risk for the planned procedure without further cardiovascular testing. The patient was advised that if he develops new symptoms prior to surgery to contact our office to arrange for a follow-up visit, and he verbalized understanding.    The patient was advised that if he develops new symptoms prior to surgery to contact our office to arrange for a follow-up visit, and he verbalized understanding.   Patient advised to hold aspirin 7 days prior to procedure and should restart postprocedure when surgically safe."  Anticipate pt can proceed with planned procedure barring acute status change.   VS: BP 113/64 Comment: right arm sittine  Pulse 64   Temp 37.2 C (Oral)   Resp 18   Ht 5\' 9"  (1.753 m)   Wt 89.8 kg   SpO2 98%   BMI 29.24 kg/m   PROVIDERS: Soundra Pilon, FNP is PCP   Primary Cardiologist:  Armanda Magic, MD  LABS: Labs reviewed: Acceptable for surgery. (all labs ordered are listed, but only abnormal results are displayed)  Labs Reviewed  BASIC METABOLIC PANEL - Abnormal; Notable for the following components:      Result Value    Glucose, Bld 122 (*)    All other components within normal limits  CBC - Abnormal; Notable for the following components:   RBC 3.99 (*)    Hemoglobin 11.8 (*)    HCT 34.4 (*)    All other components within normal limits  SURGICAL PCR SCREEN     IMAGES:   EKG:   CV:  Past Medical History:  Diagnosis Date   Arthritis    Back pain    Carotid artery disease (HCC)    1-39% bilateral carotid stenosis  by dopplers 01/2022   Coronary artery disease    s.p PTCA of circumflex lesion in 2004, cardiologist Dr Mayford Knife, nonobstructive CAD on CATH in June 2009 with 50% LAD   Dermatitis    Diverticulosis    Dyslipidemia    GERD (gastroesophageal reflux disease)    severe GERD, has to sleep elevated on pillows   History of kidney stones    Hypercholesteremia    Hypercholesteremia    Hypertension    Obesity    Peripheral neuropathy    Peripheral vascular disease (HCC)    Psoriasis    Urticaria     Past Surgical History:  Procedure Laterality Date   APPENDECTOMY     BACK SURGERY     CARDIAC CATHETERIZATION  10/23/2007   and 2004  DES x 1   CHOLECYSTECTOMY     CORONARY STENT PLACEMENT     HERNIA REPAIR  umbilical hernia repair   KIDNEY STONE SURGERY     NO PAST SURGERIES Bilateral    TONSILLECTOMY      MEDICATIONS:  albuterol (VENTOLIN HFA) 108 (90 Base) MCG/ACT inhaler   aspirin 81 MG tablet   colestipol (COLESTID) 1 g tablet   doxazosin (CARDURA) 4 MG tablet   EPINEPHrine (EPIPEN IJ)   Evolocumab (REPATHA SURECLICK) 140 MG/ML SOAJ   ezetimibe (ZETIA) 10 MG tablet   Ferrous Sulfate (IRON) 325 (65 Fe) MG TABS   hydrochlorothiazide (HYDRODIURIL) 25 MG tablet   icosapent Ethyl (VASCEPA) 1 g capsule   levothyroxine (SYNTHROID) 50 MCG tablet   lisinopril (ZESTRIL) 20 MG tablet   metoprolol tartrate (LOPRESSOR) 25 MG tablet   Multiple Vitamin (MULTIVITAMIN) capsule   nitroGLYCERIN (NITROSTAT) 0.4 MG SL tablet   omeprazole (PRILOSEC) 40 MG capsule   potassium  chloride SA (KLOR-CON M) 20 MEQ tablet   sucralfate (CARAFATE) 1 g tablet   triamcinolone cream (KENALOG) 0.1 %   No current facility-administered medications for this encounter.    Jodell Cipro Ward, PA-C [facility] [phone]

## 2022-12-02 ENCOUNTER — Other Ambulatory Visit: Payer: Self-pay

## 2022-12-02 ENCOUNTER — Ambulatory Visit (HOSPITAL_COMMUNITY): Payer: Medicare Other | Admitting: Physician Assistant

## 2022-12-02 ENCOUNTER — Ambulatory Visit (HOSPITAL_BASED_OUTPATIENT_CLINIC_OR_DEPARTMENT_OTHER): Payer: Medicare Other | Admitting: Anesthesiology

## 2022-12-02 ENCOUNTER — Ambulatory Visit (HOSPITAL_COMMUNITY)
Admission: RE | Admit: 2022-12-02 | Discharge: 2022-12-02 | Disposition: A | Discharge status: Home / Self Care | Payer: Medicare Other | Source: Ambulatory Visit | Attending: Orthopedic Surgery | Admitting: Orthopedic Surgery

## 2022-12-02 ENCOUNTER — Encounter (HOSPITAL_COMMUNITY): Payer: Self-pay | Admitting: Orthopedic Surgery

## 2022-12-02 ENCOUNTER — Encounter (HOSPITAL_COMMUNITY)
Admission: RE | Disposition: A | Discharge status: Home / Self Care | Payer: Self-pay | Source: Ambulatory Visit | Attending: Orthopedic Surgery

## 2022-12-02 DIAGNOSIS — M75102 Unspecified rotator cuff tear or rupture of left shoulder, not specified as traumatic: Secondary | ICD-10-CM | POA: Insufficient documentation

## 2022-12-02 DIAGNOSIS — I739 Peripheral vascular disease, unspecified: Secondary | ICD-10-CM | POA: Diagnosis not present

## 2022-12-02 DIAGNOSIS — I2510 Atherosclerotic heart disease of native coronary artery without angina pectoris: Secondary | ICD-10-CM | POA: Diagnosis not present

## 2022-12-02 DIAGNOSIS — Z87891 Personal history of nicotine dependence: Secondary | ICD-10-CM | POA: Insufficient documentation

## 2022-12-02 DIAGNOSIS — I10 Essential (primary) hypertension: Secondary | ICD-10-CM | POA: Diagnosis not present

## 2022-12-02 DIAGNOSIS — Z955 Presence of coronary angioplasty implant and graft: Secondary | ICD-10-CM | POA: Insufficient documentation

## 2022-12-02 DIAGNOSIS — M25812 Other specified joint disorders, left shoulder: Secondary | ICD-10-CM | POA: Diagnosis not present

## 2022-12-02 DIAGNOSIS — M12812 Other specific arthropathies, not elsewhere classified, left shoulder: Secondary | ICD-10-CM | POA: Diagnosis not present

## 2022-12-02 DIAGNOSIS — G8918 Other acute postprocedural pain: Secondary | ICD-10-CM | POA: Diagnosis not present

## 2022-12-02 HISTORY — PX: REVERSE SHOULDER ARTHROPLASTY: SHX5054

## 2022-12-02 SURGERY — ARTHROPLASTY, SHOULDER, TOTAL, REVERSE
Anesthesia: Regional | Site: Shoulder | Laterality: Left

## 2022-12-02 MED ORDER — MELOXICAM 15 MG PO TABS: 15.0000 mg | ORAL_TABLET | Freq: Every day | ORAL | 1 refills | Status: DC

## 2022-12-02 MED ORDER — LIDOCAINE HCL (CARDIAC) PF 100 MG/5ML IV SOSY
PREFILLED_SYRINGE | INTRAVENOUS | Status: DC | PRN
  Administered 2022-12-02: 20 mg via INTRAVENOUS

## 2022-12-02 MED ORDER — CEFAZOLIN SODIUM-DEXTROSE 2-4 GM/100ML-% IV SOLN
2.0000 g | INTRAVENOUS | Status: AC
  Administered 2022-12-02: 2 g via INTRAVENOUS
  Filled 2022-12-02: qty 100

## 2022-12-02 MED ORDER — ROCURONIUM BROMIDE 100 MG/10ML IV SOLN
INTRAVENOUS | Status: DC | PRN
  Administered 2022-12-02: 40 mg via INTRAVENOUS
  Administered 2022-12-02: 10 mg via INTRAVENOUS
  Administered 2022-12-02: 5 mg via INTRAVENOUS

## 2022-12-02 MED ORDER — PROPOFOL 10 MG/ML IV BOLUS
INTRAVENOUS | Status: DC | PRN
Start: 2022-12-02 — End: 2022-12-02
  Administered 2022-12-02: 120 mg via INTRAVENOUS
  Administered 2022-12-02: 20 mg via INTRAVENOUS

## 2022-12-02 MED ORDER — EPHEDRINE SULFATE (PRESSORS) 50 MG/ML IJ SOLN
INTRAMUSCULAR | Status: DC | PRN
  Administered 2022-12-02 (×3): 5 mg via INTRAVENOUS
  Administered 2022-12-02: 10 mg via INTRAVENOUS

## 2022-12-02 MED ORDER — MIDAZOLAM HCL 2 MG/2ML IJ SOLN: 1.0000 mg | INTRAMUSCULAR | Status: DC

## 2022-12-02 MED ORDER — VANCOMYCIN HCL 1000 MG IV SOLR
INTRAVENOUS | Status: AC
  Filled 2022-12-02: qty 20

## 2022-12-02 MED ORDER — VASOPRESSIN 20 UNIT/ML IV SOLN
INTRAVENOUS | Status: DC | PRN
  Administered 2022-12-02: 2 [IU] via INTRAVENOUS
  Administered 2022-12-02: 1 [IU] via INTRAVENOUS

## 2022-12-02 MED ORDER — FENTANYL CITRATE PF 50 MCG/ML IJ SOSY: 25.0000 ug | PREFILLED_SYRINGE | INTRAMUSCULAR | Status: DC | PRN

## 2022-12-02 MED ORDER — ROCURONIUM BROMIDE 10 MG/ML (PF) SYRINGE
PREFILLED_SYRINGE | INTRAVENOUS | Status: AC
  Filled 2022-12-02: qty 10

## 2022-12-02 MED ORDER — SUCCINYLCHOLINE CHLORIDE 200 MG/10ML IV SOSY
PREFILLED_SYRINGE | INTRAVENOUS | Status: AC
  Filled 2022-12-02: qty 10

## 2022-12-02 MED ORDER — CHLORHEXIDINE GLUCONATE 0.12 % MT SOLN
15.0000 mL | Freq: Once | OROMUCOSAL | Status: AC
  Administered 2022-12-02: 15 mL via OROMUCOSAL

## 2022-12-02 MED ORDER — 0.9 % SODIUM CHLORIDE (POUR BTL) OPTIME
TOPICAL | Status: DC | PRN
  Administered 2022-12-02: 1000 mL

## 2022-12-02 MED ORDER — LACTATED RINGERS IV SOLN: INTRAVENOUS | Status: DC

## 2022-12-02 MED ORDER — BUPIVACAINE HCL (PF) 0.5 % IJ SOLN
INTRAMUSCULAR | Status: DC | PRN
  Administered 2022-12-02: 15 mL via PERINEURAL

## 2022-12-02 MED ORDER — FENTANYL CITRATE PF 50 MCG/ML IJ SOSY
50.0000 ug | PREFILLED_SYRINGE | INTRAMUSCULAR | Status: DC
  Administered 2022-12-02: 100 ug via INTRAVENOUS
  Filled 2022-12-02: qty 2

## 2022-12-02 MED ORDER — ONDANSETRON HCL 4 MG/2ML IJ SOLN
INTRAMUSCULAR | Status: AC
  Filled 2022-12-02: qty 2

## 2022-12-02 MED ORDER — PROPOFOL 10 MG/ML IV BOLUS
INTRAVENOUS | Status: AC
  Filled 2022-12-02: qty 20

## 2022-12-02 MED ORDER — SUCCINYLCHOLINE CHLORIDE 200 MG/10ML IV SOSY
PREFILLED_SYRINGE | INTRAVENOUS | Status: DC | PRN
  Administered 2022-12-02: 160 mg via INTRAVENOUS

## 2022-12-02 MED ORDER — TRANEXAMIC ACID 1000 MG/10ML IV SOLN: 1000.0000 mg | INTRAVENOUS | Status: DC

## 2022-12-02 MED ORDER — VANCOMYCIN HCL 1000 MG IV SOLR
INTRAVENOUS | Status: DC | PRN
  Administered 2022-12-02: 1000 mg via TOPICAL

## 2022-12-02 MED ORDER — TRANEXAMIC ACID-NACL 1000-0.7 MG/100ML-% IV SOLN
1000.0000 mg | INTRAVENOUS | Status: AC
  Administered 2022-12-02: 1000 mg via INTRAVENOUS
  Filled 2022-12-02: qty 100

## 2022-12-02 MED ORDER — CYCLOBENZAPRINE HCL 10 MG PO TABS: 10.0000 mg | ORAL_TABLET | Freq: Three times a day (TID) | ORAL | 1 refills | Status: DC | PRN

## 2022-12-02 MED ORDER — ONDANSETRON HCL 4 MG/2ML IJ SOLN
INTRAMUSCULAR | Status: DC | PRN
  Administered 2022-12-02: 4 mg via INTRAVENOUS

## 2022-12-02 MED ORDER — STERILE WATER FOR IRRIGATION IR SOLN
Status: DC | PRN
  Administered 2022-12-02: 2000 mL

## 2022-12-02 MED ORDER — OXYCODONE-ACETAMINOPHEN 5-325 MG PO TABS: 1.0000 | ORAL_TABLET | ORAL | 0 refills | Status: AC | PRN

## 2022-12-02 MED ORDER — ONDANSETRON HCL 4 MG PO TABS: 4.0000 mg | ORAL_TABLET | Freq: Three times a day (TID) | ORAL | 0 refills | Status: AC | PRN

## 2022-12-02 MED ORDER — EPHEDRINE 5 MG/ML INJ
INTRAVENOUS | Status: AC
  Filled 2022-12-02: qty 5

## 2022-12-02 MED ORDER — ACETAMINOPHEN 500 MG PO TABS
1000.0000 mg | ORAL_TABLET | Freq: Once | ORAL | Status: AC
  Administered 2022-12-02: 1000 mg via ORAL
  Filled 2022-12-02: qty 2

## 2022-12-02 MED ORDER — DEXAMETHASONE SODIUM PHOSPHATE 10 MG/ML IJ SOLN
INTRAMUSCULAR | Status: DC | PRN
  Administered 2022-12-02: 5 mg via INTRAVENOUS

## 2022-12-02 MED ORDER — ORAL CARE MOUTH RINSE: 15.0000 mL | Freq: Once | OROMUCOSAL | Status: AC

## 2022-12-02 MED ORDER — DEXAMETHASONE SODIUM PHOSPHATE 10 MG/ML IJ SOLN
INTRAMUSCULAR | Status: AC
  Filled 2022-12-02: qty 1

## 2022-12-02 MED ORDER — BUPIVACAINE LIPOSOME 1.3 % IJ SUSP
INTRAMUSCULAR | Status: DC | PRN
  Administered 2022-12-02: 10 mL via PERINEURAL

## 2022-12-02 MED ORDER — SUGAMMADEX SODIUM 200 MG/2ML IV SOLN
INTRAVENOUS | Status: DC | PRN
  Administered 2022-12-02: 100 mg via INTRAVENOUS
  Administered 2022-12-02: 200 mg via INTRAVENOUS

## 2022-12-02 MED ORDER — PHENYLEPHRINE HCL-NACL 20-0.9 MG/250ML-% IV SOLN
INTRAVENOUS | Status: DC | PRN
  Administered 2022-12-02: 40 ug/min via INTRAVENOUS

## 2022-12-02 MED ORDER — LIDOCAINE HCL (PF) 2 % IJ SOLN
INTRAMUSCULAR | Status: AC
  Filled 2022-12-02: qty 5

## 2022-12-02 MED ORDER — PHENYLEPHRINE 80 MCG/ML (10ML) SYRINGE FOR IV PUSH (FOR BLOOD PRESSURE SUPPORT)
PREFILLED_SYRINGE | INTRAVENOUS | Status: AC
  Filled 2022-12-02: qty 10

## 2022-12-02 MED ORDER — VASOPRESSIN 20 UNIT/ML IV SOLN
INTRAVENOUS | Status: AC
  Filled 2022-12-02: qty 1

## 2022-12-02 SURGICAL SUPPLY — 69 items
ADH SKN CLS APL DERMABOND .7 (GAUZE/BANDAGES/DRESSINGS) ×1
AID PSTN UNV HD RSTRNT DISP (MISCELLANEOUS) ×1
BAG COUNTER SPONGE SURGICOUNT (BAG) IMPLANT
BAG SPEC THK2 15X12 ZIP CLS (MISCELLANEOUS) ×1
BAG SPNG CNTER NS LX DISP (BAG)
BAG ZIPLOCK 12X15 (MISCELLANEOUS) ×1 IMPLANT
BIT DRILL AR 3 NS (BIT) IMPLANT
BLADE SAW SGTL 83.5X18.5 (BLADE) ×1 IMPLANT
BNDG CMPR 5X4 CHSV STRCH STRL (GAUZE/BANDAGES/DRESSINGS) ×1
BNDG COHESIVE 4X5 TAN STRL LF (GAUZE/BANDAGES/DRESSINGS) ×1 IMPLANT
BSPLAT GLND +2X24 MDLR (Joint) ×1 IMPLANT
COOLER ICEMAN CLASSIC (MISCELLANEOUS) ×1 IMPLANT
COVER BACK TABLE 60X90IN (DRAPES) ×1 IMPLANT
COVER SURGICAL LIGHT HANDLE (MISCELLANEOUS) ×1 IMPLANT
CUP SUT UNIV REVERS 39 NEU (Shoulder) IMPLANT
DERMABOND ADVANCED .7 DNX12 (GAUZE/BANDAGES/DRESSINGS) ×1 IMPLANT
DRAPE ORTHO SPLIT 77X108 STRL (DRAPES) ×2
DRAPE SHEET LG 3/4 BI-LAMINATE (DRAPES) ×1 IMPLANT
DRAPE SURG 17X11 SM STRL (DRAPES) ×1 IMPLANT
DRAPE SURG ORHT 6 SPLT 77X108 (DRAPES) ×2 IMPLANT
DRAPE TOP 10253 STERILE (DRAPES) ×1 IMPLANT
DRAPE U-SHAPE 47X51 STRL (DRAPES) ×1 IMPLANT
DRESSING AQUACEL AG SP 3.5X6 (GAUZE/BANDAGES/DRESSINGS) ×1 IMPLANT
DRSG AQUACEL AG ADV 3.5X 6 (GAUZE/BANDAGES/DRESSINGS) IMPLANT
DRSG AQUACEL AG ADV 3.5X10 (GAUZE/BANDAGES/DRESSINGS) IMPLANT
DRSG AQUACEL AG SP 3.5X6 (GAUZE/BANDAGES/DRESSINGS) ×1
DURAPREP 26ML APPLICATOR (WOUND CARE) ×1 IMPLANT
ELECT BLADE TIP CTD 4 INCH (ELECTRODE) ×1 IMPLANT
ELECT PENCIL ROCKER SW 15FT (MISCELLANEOUS) ×1 IMPLANT
ELECT REM PT RETURN 15FT ADLT (MISCELLANEOUS) ×1 IMPLANT
FACESHIELD WRAPAROUND (MASK) ×5 IMPLANT
FACESHIELD WRAPAROUND OR TEAM (MASK) ×5 IMPLANT
GLENOID UNI REV MOD 24 +2 LAT (Joint) IMPLANT
GLENOSPHERE 39+4 LAT/24 UNI RV (Joint) IMPLANT
GLOVE BIO SURGEON STRL SZ7.5 (GLOVE) ×1 IMPLANT
GLOVE BIO SURGEON STRL SZ8 (GLOVE) ×1 IMPLANT
GLOVE SS BIOGEL STRL SZ 7 (GLOVE) ×1 IMPLANT
GLOVE SS BIOGEL STRL SZ 7.5 (GLOVE) ×1 IMPLANT
GOWN STRL SURGICAL XL XLNG (GOWN DISPOSABLE) ×2 IMPLANT
INSERT HUMERAL 39/+6 (Insert) IMPLANT
KIT BASIN OR (CUSTOM PROCEDURE TRAY) ×1 IMPLANT
KIT TURNOVER KIT A (KITS) IMPLANT
MANIFOLD NEPTUNE II (INSTRUMENTS) ×1 IMPLANT
NDL TAPERED W/ NITINOL LOOP (MISCELLANEOUS) ×1 IMPLANT
NEEDLE TAPERED W/ NITINOL LOOP (MISCELLANEOUS) ×1 IMPLANT
NS IRRIG 1000ML POUR BTL (IV SOLUTION) ×1 IMPLANT
PACK SHOULDER (CUSTOM PROCEDURE TRAY) ×1 IMPLANT
PAD ARMBOARD 7.5X6 YLW CONV (MISCELLANEOUS) ×1 IMPLANT
PAD COLD SHLDR WRAP-ON (PAD) ×1 IMPLANT
PIN NITINOL TARGETER 2.8 (PIN) IMPLANT
PIN SET MODULAR GLENOID SYSTEM (PIN) IMPLANT
RESTRAINT HEAD UNIVERSAL NS (MISCELLANEOUS) ×1 IMPLANT
SCREW CENTRAL MOD 30MM (Screw) IMPLANT
SCREW PERI LOCK 5.5X32 (Screw) IMPLANT
SCREW PERI LOCK 5.5X36 (Screw) IMPLANT
SCREW PERIPHERAL 5.5X20 LOCK (Screw) IMPLANT
SLING ARM FOAM STRAP LRG (SOFTGOODS) IMPLANT
SLING ARM FOAM STRAP MED (SOFTGOODS) IMPLANT
STEM HUMERAL UNIVERS SZ8 (Stem) IMPLANT
SUT MNCRL AB 3-0 PS2 18 (SUTURE) ×1 IMPLANT
SUT MON AB 2-0 CT1 36 (SUTURE) ×1 IMPLANT
SUT VIC AB 1 CT1 36 (SUTURE) ×1 IMPLANT
SUTURE TAPE 1.3 40 TPR END (SUTURE) ×2 IMPLANT
SUTURETAPE 1.3 40 TPR END (SUTURE) ×2
TOWEL OR 17X26 10 PK STRL BLUE (TOWEL DISPOSABLE) ×1 IMPLANT
TOWEL OR NON WOVEN STRL DISP B (DISPOSABLE) ×1 IMPLANT
TUBE SUCTION HIGH CAP CLEAR NV (SUCTIONS) ×1 IMPLANT
TUBING CONNECTING 10 (TUBING) ×1 IMPLANT
WATER STERILE IRR 1000ML POUR (IV SOLUTION) ×2 IMPLANT

## 2022-12-02 NOTE — Transfer of Care (Signed)
Immediate Anesthesia Transfer of Care Note  Patient: Kenneth Poole  Procedure(s) Performed: REVERSE SHOULDER ARTHROPLASTY (Left: Shoulder)  Patient Location: PACU  Anesthesia Type:General  Level of Consciousness: awake, alert , oriented, and patient cooperative  Airway & Oxygen Therapy: Patient Spontanous Breathing and Patient connected to face mask oxygen  Post-op Assessment: Report given to RN and Post -op Vital signs reviewed and stable  Post vital signs: Reviewed and stable  Last Vitals:  Vitals Value Taken Time  BP 124/63 12/02/22 1317  Temp    Pulse 59 12/02/22 1321  Resp 17 12/02/22 1321  SpO2 100 % 12/02/22 1321  Vitals shown include unfiled device data.  Last Pain:  Vitals:   12/02/22 1132  TempSrc:   PainSc: 0-No pain         Complications: No notable events documented.

## 2022-12-02 NOTE — Evaluation (Signed)
Occupational Therapy Evaluation Patient Details Name: Kenneth Poole MRN: [id number] DOB: [birth year] Today's Date: 12/02/2022   History of Present Illness Mr. Crescenzo is a 77 yr old male who is s/p L shoulder reverse arthroplasty on 12-02-22, due to L rotator cuff tear arthopathy.   Clinical Impression   Pt is s/p L shoulder replacement on 12-02-22. Therapist provided education and instruction to patient and spouse with regards to UE ROM/exercises, post-op precautions, UE and sling positioning, donning upper extremity clothing, recommendations for bathing while maintaining shoulder precautions, use of ice for pain and edema management, correct use of ice machine, and correctly donning/doffing sling. Patient and spouse verbalized and demonstrated understanding as needed. Patient needed assistance to donn shirt, underwear, pants, and shoes with instruction provided on compensatory strategies to perform ADLs. Patient to follow up with MD for further therapy needs.        Recommendations for follow up therapy are one component of a multi-disciplinary discharge planning process, led by the attending physician.  Recommendations may be updated based on patient status, additional functional criteria and insurance authorization.   Assistance Recommended at Discharge Intermittent Supervision/Assistance  Patient can return home with the following Help with stairs or ramp for entrance;Assistance with cooking/housework;A little help with bathing/dressing/bathroom;Assist for transportation    Functional Status Assessment  Patient has had a recent decline in their functional status and demonstrates the ability to make significant improvements in function in a reasonable and predictable amount of time.  Equipment Recommendations  None recommended by OT    Recommendations for Other Services       Precautions / Restrictions Precautions Precautions: Shoulder Type of Shoulder Precautions: Sling at all  times except ADLs and exercise Shoulder Interventions: Shoulder sling/immobilizer Precaution Booklet Issued: Yes (comment) Precaution Comments: If sitting in controlled environment, ok to come out of sling to give neck a break. Please sleep in it to protect until follow up in office. OK to use operative arm for feeding, hygiene and ADLs.   Ok to instruct Pendulums and lap slides as exercises. Ok to use operative arm within the following parameters for ADL purposes     New ROM (4/09)   Ok for PROM, AAROM, AROM within pain tolerance and within the following ROM   ER 20   ABD 45   FE 60 Required Braces or Orthoses: Sling Restrictions Weight Bearing Restrictions: Yes LUE Weight Bearing: Non weight bearing      Mobility Bed Mobility      General bed mobility comments: Pt was received seated in chair    Transfers Overall transfer level: Needs assistance Equipment used: None Transfers: Sit to/from Stand Sit to Stand: Supervision                  Balance Overall balance assessment: Mild deficits observed, not formally tested           ADL either performed or assessed with clinical judgement       Pertinent Vitals/Pain Pain Assessment Pain Assessment: No/denies pain     Hand Dominance Right      Communication Communication Communication: No difficulties   Cognition Arousal/Alertness: Awake/alert Behavior During Therapy: WFL for tasks assessed/performed Overall Cognitive Status: Within Functional Limits for tasks assessed          General Comments: Oriented x4, cooperative, friendly, able to follow commands           Shoulder Instructions Shoulder Instructions Donning/doffing shirt without moving shoulder: Minimal assistance (with caregiver performing) Method for sponge  bathing under operated UE: Caregiver independent with task Donning/doffing sling/immobilizer: Minimal assistance (with caregiver performing) Correct positioning of sling/immobilizer: Minimal  assistance (with caregiver performing) Pendulum exercises (written home exercise program): Caregiver independent with task ROM for elbow, wrist and digits of operated UE: Caregiver independent with task Sling wearing schedule (on at all times/off for ADL's): Caregiver independent with task Proper positioning of operated UE when showering: Caregiver independent with task Dressing change:  (N/A) Positioning of UE while sleeping: Caregiver independent with task    Home Living Family/patient expects to be discharged to:: Private residence Living Arrangements: Spouse/significant other   Type of Home: House Home Access: Ramped entrance     Home Layout: One level     Bathroom Shower/Tub: Walk-in shower         Home Equipment: Shower seat - built in;Grab bars - tub/shower          Prior Functioning/Environment Prior Level of Function : Independent/Modified Independent;Driving             Mobility Comments: He was independent with ambulation. ADLs Comments: He was independent with ADLs, driving, and sharing household chores with his spouse.        OT Problem List: Impaired UE functional use      OT Treatment/Interventions:   No further OT treatment needs in acute setting      OT Frequency:  N/A       AM-PAC OT "6 Clicks" Daily Activity     Outcome Measure Help from another person eating meals?: None Help from another person taking care of personal grooming?: None Help from another person toileting, which includes using toliet, bedpan, or urinal?: A Little Help from another person bathing (including washing, rinsing, drying)?: A Little Help from another person to put on and taking off regular upper body clothing?: A Little Help from another person to put on and taking off regular lower body clothing?: A Little 6 Click Score: 20   End of Session Nurse Communication: Other (comment) (Shoulder education completed)  Activity Tolerance: Patient tolerated treatment  well Patient left: in chair;with call bell/phone within reach;with family/visitor present  OT Visit Diagnosis: Muscle weakness (generalized) (M62.81)                Time: 1610-9604 OT Time Calculation (min): 29 min Charges:  OT General Charges $OT Visit: 1 Visit OT Evaluation $OT Eval Moderate Complexity: 1 Mod OT Treatments $Self Care/Home Management : 8-22 mins    Reuben Likes, OTR/L 12/02/2022, 4:09 PM

## 2022-12-02 NOTE — Anesthesia Procedure Notes (Signed)
Anesthesia Regional Block: Interscalene brachial plexus block   Pre-Anesthetic Checklist: , timeout performed,  Correct Patient, Correct Site, Correct Laterality,  Correct Procedure, Correct Position, site marked,  Risks and benefits discussed,  Pre-op evaluation,  At surgeon's request and post-op pain management  Laterality: Left  Prep: Maximum Sterile Barrier Precautions used, chloraprep       Needles:  Injection technique: Single-shot  Needle Type: Echogenic Stimulator Needle     Needle Length: 5cm  Needle Gauge: 21     Additional Needles:   Procedures:,,,, ultrasound used (permanent image in chart),,    Narrative:  Start time: 12/02/2022 11:15 AM End time: 12/02/2022 11:19 AM Injection made incrementally with aspirations every 5 mL. Anesthesiologist: Elmer Picker, MD

## 2022-12-02 NOTE — Op Note (Signed)
12/02/2022  12:55 PM  PATIENT:   Kenneth Poole  77 y.o. male  PRE-OPERATIVE DIAGNOSIS:  Left shoulder rotator cuff tear arthropathy  POST-OPERATIVE DIAGNOSIS: Same  PROCEDURE: Left shoulder reverse arthroplasty lysing a press-fit size 8 Arthrex stem with a neutral metathesis, +6 standard polyethylene insert, 39/+4 glenosphere and a small/+2 baseplate  SURGEON:  Daina Cara, Vania Rea M.D.  ASSISTANTS: Ralene Bathe, PA-C  Ralene Bathe, PA-C was utilized as an Geophysicist/field seismologist throughout this case, essential for help with positioning the patient, positioning extremity, tissue manipulation, implantation of the prosthesis, suture management, wound closure, and intraoperative decision-making.  ANESTHESIA:   General endotracheal and interscalene block with Exparel  EBL: 250 cc  SPECIMEN: None  Drains: None   PATIENT DISPOSITION:  PACU - hemodynamically stable.    PLAN OF CARE: Discharge to home after PACU  Brief history:  Patient is a 77 year old gentleman who has been followed for chronic and progressive increasing bilateral shoulder pain left much more symptomatic than the right.  Is brought to the operating this time for planned left shoulder reverse arthroplasty.  Preoperatively, I counseled the patient regarding treatment options and risks versus benefits thereof.  Possible surgical complications were all reviewed including potential for bleeding, infection, neurovascular injury, persistent pain, loss of motion, anesthetic complication, failure of the implant, and possible need for additional surgery. They understand and accept and agrees with our planned procedure.   Procedure detail:  After undergoing routine preop evaluation the patient received prophylactic antibiotics and interscalene block with Exparel was established in the holding area by the anesthesia department.  Subsequently placed spine on the operating table and underwent the smooth induction of a general endotracheal  anesthesia.  Placed into the beachchair position and appropriately padded and protected.  The left shoulder girdle region was sterilely prepped and draped in standard fashion.  Timeout was called.  A deltopectoral approach left shoulder was made through an approximate 10 cm incision.  Skin flaps elevated dissection carried deeply and the deltopectoral interval was then developed from proximal to distal with the vein taken laterally.  The conjoined tendon was mobilized and retracted medially.  Long head biceps tendon was tenodesed to the upper border the pectoralis major tendon with proximal segment unroofed and excised.  Rotator cuff completely deficient superiorly.  The subscapularis was separated from the lesser tuberosity and tagged with a pair of grasping suture tape sutures although it did not show substantial bulk and was of questionable functional value but sutures were placed for retraction purposes.  Capsular attachments were then divided from the anterior and infra margins of the humeral neck allowing deliver the humeral head through the wound.  An extra medullary guide was then used to outline the proposed humeral head resection which we performed with an oscillating saw at approximate 20 degrees of retroversion.  Marginal osteophytes were removed with a rondure and a metal cap was then placed over the cut proximal humeral surface.  Glenoid was then exposed and a circumferential labral resection was completed.  Guidepin then directed into the center of the glenoid and the glenoid was reamed with the central followed by the peripheral reamer to a stable [place] bony bed.  Preparation completed with the drill and tap for a 30 mm lag screw.  The joint was copes irrigated.  All debris was carefully removed.  Our baseplate was assembled and inserted with vancomycin powder applied to the threads of the lag screw and it was then inserted with excellent purchase and fixation.  The  peripheral locking screws were  all then placed using standard technique with excellent fixation.  A 39/+4 glenosphere was then impacted onto the baseplate and the central locking screw was placed.  Attention then returned back to the humeral metaphysis where the canal was opened and we broached up ultimately to a size 8 stem at 20 degrees of retroversion.  A neutral metaphyseal reaming guide was then used to prepare the metaphysis.  A trial implant was then placed showing excellent motion stability and soft tissue balance.  Trial was then removed.  A final implant was assembled.  The canal was irrigated cleaned and dried with vancomycin powder regimen for the canal.  Our final implant was then seated with excellent fixation.  Trial reductions ultimately showed the best soft tissue balance with a +6 poly insert.  The trial was removed and the final poly was impacted after the implant was cleaned and dried.  Final reduction was then performed showing excellent motion stability and soft tissue balance all much to our satisfaction.  We confirmed that the subscapularis had very tenuous qualities and so it was not repaired.  The joint was copiously irrigated.  Final hemostasis was obtained.  The balance of the vancomycin powder was then sprayed liberally throughout the deep soft tissue planes.  The rotator deltopectoral interval was then repaired with a series of figure-of-eight number Vicryl sutures.  2-0 Monocryl used to close the subcu layer and intracuticular 3-0 Monocryl used to close the skin followed by Dermabond and Aquacel dressing.  The left arm was then placed into a sling and the patient was awakened, extubated, and taken to the recovery room in stable condition.  Senaida Lange MD   Contact # [phone]

## 2022-12-02 NOTE — Discharge Instructions (Signed)
? ?Kenneth Poole, M.D., F.A.A.O.S. ?Orthopaedic Surgery ?Specializing in Arthroscopic and Reconstructive ?Surgery of the Shoulder ?[phone] ?[address] - [place], [place] [address] - Fax [phone] ? ? ?POST-OP TOTAL SHOULDER REPLACEMENT INSTRUCTIONS ? ?1. Follow up in the office for your first post-op appointment 10-14 days from the date of your surgery. If you do not already have a scheduled appointment, our office will contact you to schedule. ? ?2. The bandage over your incision is waterproof. You may begin showering with this dressing on. You may leave this dressing on until first follow up appointment within 2 weeks. We prefer you leave this dressing in place until follow up however after 5-7 days if you are having itching or skin irritation and would like to remove it you may do so. Go slow and tug at the borders gently to break the bond the dressing has with the skin. At this point if there is no drainage it is okay to go without a bandage or you may cover it with a light guaze and tape. You can also expect significant bruising around your shoulder that will drift down your arm and into your chest wall. This is very normal and should resolve over several days. ? ? 3. Wear your sling/immobilizer at all times except to perform the exercises below or to occasionally let your arm dangle by your side to stretch your elbow. You also need to sleep in your sling immobilizer until instructed otherwise. It is ok to remove your sling if you are sitting in a controlled environment and allow your arm to rest in a position of comfort by your side or on your lap with pillows to give your neck and skin a break from the sling. You may remove it to allow arm to dangle by side to shower. If you are up walking around and when you go to sleep at night you need to wear it. ? ?4. Range of motion to your elbow, wrist, and hand are encouraged 3-5 times daily. Exercise to your hand and fingers helps to reduce  swelling you may experience. ? ? ?5. Prescriptions for a pain medication and a muscle relaxant are provided for you. It is recommended that if you are experiencing pain that you pain medication alone is not controlling, add the muscle relaxant along with the pain medication which can give additional pain relief. The first 1-2 days is generally the most severe of your pain and then should gradually decrease. As your pain lessens it is recommended that you decrease your use of the pain medications to an "as needed basis'" only and to always comply with the recommended dosages of the pain medications. ? ?6. Pain medications can produce constipation along with their use. If you experience this, the use of an over the counter stool softener or laxative daily is recommended.  ? ?7. For additional questions or concerns, please do not hesitate to call the office. If after hours there is an answering service to forward your concerns to the physician on call. ? ?8.Pain control following an exparel block ? ?To help control your post-operative pain you received a nerve block  performed with Exparel which is a long acting anesthetic (numbing agent) which can provide pain relief and sensations of numbness (and relief of pain) in the operative shoulder and arm for up to 3 days. Sometimes it provides mixed relief, meaning you may still have numbness in certain areas of the arm but can still be able to   move  parts of that arm, hand, and fingers. We recommend that your prescribed pain medications  be used as needed. We do not feel it is necessary to "pre medicate" and "stay ahead" of pain.  Taking narcotic pain medications when you are not having any pain can lead to unnecessary and potentially dangerous side effects.   ? ?9. Use the ice machine as much as possible in the first 5-7 days from surgery, then you can wean its use to as needed. The ice typically needs to be replaced every 6 hours, instead of ice you can actually freeze  water bottles to put in the cooler and then fill water around them to avoid having to purchase ice. You can have spare water bottles freezing to allow you to rotate them once they have melted. Try to have a thin shirt or light cloth or towel under the ice wrap to protect your skin.  ? ?FOR ADDITIONAL INFO ON ICE MACHINE AND INSTRUCTIONS GO TO THE WEBSITE AT ? ?https://www.djoglobal.com/products/donjoy/donjoy-iceman-classic3 ? ?10.  We recommend that you avoid any dental work or cleaning in the first 3 months following your joint replacement. This is to help minimize the possibility of infection from the bacteria in your mouth that enters your bloodstream during dental work. We also recommend that you take an antibiotic prior to your dental work for the first year after your shoulder replacement to further help reduce that risk. Please simply contact our office for antibiotics to be sent to your pharmacy prior to dental work. ? ?11. Dental Antibiotics: ? ?We recommend waiting at least 3 months for any dental work even cleanings unless there is a dental emergency. We also recommend  prophylactic antibiotics for all dental procdeures  the first year following your joint replacement. In some exceptions we recommend them to be used lifelong. We will provide you with that prescription in follow up office visits, or you can call our office. ? ?Exceptions are as follows: ? ?1. History of prior total joint infection ? ?2. Severely immunocompromised (Organ Transplant, cancer chemotherapy, Rheumatoid biologic ?meds such as Humera) ? ?3. Poorly controlled diabetes (A1C &gt; 8.0, blood glucose over 200) ? ? ?POST-OP EXERCISES ? ?Pendulum Exercises ? ?Perform pendulum exercises while standing and bending at the waist. Support your uninvolved arm on a table or chair and allow your operated arm to hang freely. Make sure to do these exercises passively - not using you shoulder muscles. These exercises can be performed once your  nerve block effects have worn off. ? ?Repeat 20 times. Do 3 sessions per day. ? ? ?  ?

## 2022-12-02 NOTE — Care Plan (Signed)
Ortho Bundle Case Management Note  Patient Details  Name: Kenneth Poole MRN: [id number] Date of Birth: Jul 23, 1945                  L Rev TSA on 12/02/22.  DCP: Home with wife.  DME: No needs. Will get ice machine at hospital.  PT: EO 8/12 at 9:30am   DME Arranged:  N/A DME Agency:       Additional Comments: Please contact me with any questions of if this plan should need to change.    Despina Pole, Case Manager  [facility]  [phone] 12/02/2022, 2:07 PM

## 2022-12-02 NOTE — H&P (Signed)
Kenneth Poole    Chief Complaint: Left shoulder rotator cuff tear arthropathy HPI: The patient is a 77 y.o. male with chronic and progressive increasing left shoulder pain related to severe rotator cuff tear arthropathy.  Due to his increasing functional imitations and failure to respond to prolonged attempts at conservative management, he is brought to the operating this time for planned left shoulder reverse arthroplasty  Past Medical History:  Diagnosis Date   Arthritis    Back pain    Carotid artery disease (HCC)    1-39% bilateral carotid stenosis  by dopplers 01/2022   Coronary artery disease    s.p PTCA of circumflex lesion in 2004, cardiologist Dr Mayford Knife, nonobstructive CAD on CATH in June 2009 with 50% LAD   Dermatitis    Diverticulosis    Dyslipidemia    GERD (gastroesophageal reflux disease)    severe GERD, has to sleep elevated on pillows   History of kidney stones    Hypercholesteremia    Hypercholesteremia    Hypertension    Obesity    Peripheral neuropathy    Peripheral vascular disease (HCC)    Psoriasis    Urticaria       Past Surgical History:  Procedure Laterality Date   APPENDECTOMY     BACK SURGERY     CARDIAC CATHETERIZATION  10/23/2007   and 2004  DES x 1   CHOLECYSTECTOMY     CORONARY STENT PLACEMENT     HERNIA REPAIR     umbilical hernia repair   KIDNEY STONE SURGERY     NO PAST SURGERIES Bilateral    TONSILLECTOMY      Family History  Problem Relation Age of Onset   Alzheimer's disease Mother    Heart attack Father     Social History:  reports that he quit smoking about 33 years ago. His smoking use included cigarettes. He has never used smokeless tobacco. He reports that he does not drink alcohol and does not use drugs.  BMI: Estimated body mass index is 29.24 kg/m as calculated from the following:   Height as of 11/22/22: 5\' 9"  (1.753 m).   Weight as of 11/22/22: 89.8 kg.  Lab Results  Component Value Date   ALBUMIN 4.6  04/02/2021   Diabetes: Patient does not have a diagnosis of diabetes.     Smoking Status:       No medications prior to admission.     Physical Exam: Left shoulder demonstrates painful and guarded motion is noted at recent office visits.  He has globally decreased strength to manual muscle testing.  Examination otherwise as documented at his recent office visits.  Imaging studies confirmed changes consistent with chronic left shoulder rotator cuff tear arthropathy  Vitals     Assessment/Plan  Impression: Left shoulder rotator cuff tear arthropathy  Plan of Action: Procedure(s): REVERSE SHOULDER ARTHROPLASTY  Kenneth Poole 12/02/2022, 5:51 AM Contact # [phone]

## 2022-12-02 NOTE — Anesthesia Postprocedure Evaluation (Signed)
Anesthesia Post Note  Patient: Kenneth Poole  Procedure(s) Performed: REVERSE SHOULDER ARTHROPLASTY (Left: Shoulder)     Patient location during evaluation: PACU Anesthesia Type: Regional and General Level of consciousness: awake and alert Pain management: pain level controlled Vital Signs Assessment: post-procedure vital signs reviewed and stable Respiratory status: spontaneous breathing, nonlabored ventilation, respiratory function stable and patient connected to nasal cannula oxygen Cardiovascular status: blood pressure returned to baseline and stable Postop Assessment: no apparent nausea or vomiting Anesthetic complications: no  No notable events documented.  Last Vitals:  Vitals:   12/02/22 1345 12/02/22 1354  BP: 111/63 104/61  Pulse: 65 72  Resp: 16 15  Temp:  (!) 36.4 C  SpO2: 92% 93%    Last Pain:  Vitals:   12/02/22 1354  TempSrc:   PainSc: 0-No pain                 [name]

## 2022-12-02 NOTE — Anesthesia Procedure Notes (Signed)
Procedure Name: Intubation Date/Time: 12/02/2022 11:51 AM  Performed by: Garth Bigness, CRNAPre-anesthesia Checklist: Patient identified, Emergency Drugs available, Suction available and Patient being monitored Patient Re-evaluated:Patient Re-evaluated prior to induction Oxygen Delivery Method: Circle system utilized Preoxygenation: Pre-oxygenation with 100% oxygen Induction Type: Rapid sequence, Cricoid Pressure applied and IV induction Ventilation: Mask ventilation without difficulty Laryngoscope Size: Mac and 4 Grade View: Grade I Tube type: Oral Tube size: 7.5 mm Number of attempts: 1 Airway Equipment and Method: Stylet Placement Confirmation: ETT inserted through vocal cords under direct vision, positive ETCO2 and breath sounds checked- equal and bilateral Secured at: 23 cm Tube secured with: Tape Dental Injury: Teeth and Oropharynx as per pre-operative assessment

## 2022-12-03 ENCOUNTER — Other Ambulatory Visit: Payer: Medicare Other

## 2022-12-07 ENCOUNTER — Encounter (HOSPITAL_COMMUNITY): Payer: Self-pay | Admitting: Orthopedic Surgery

## 2022-12-15 DIAGNOSIS — Z471 Aftercare following joint replacement surgery: Secondary | ICD-10-CM | POA: Diagnosis not present

## 2022-12-15 DIAGNOSIS — Z96612 Presence of left artificial shoulder joint: Secondary | ICD-10-CM | POA: Diagnosis not present

## 2022-12-24 ENCOUNTER — Ambulatory Visit (INDEPENDENT_AMBULATORY_CARE_PROVIDER_SITE_OTHER): Payer: Medicare Other | Admitting: Podiatrist

## 2022-12-24 DIAGNOSIS — B351 Tinea unguium: Secondary | ICD-10-CM

## 2022-12-24 NOTE — Progress Notes (Signed)
Patient presents today for the 5th laser treatment. Diagnosed with mycotic nail infection by Dr. Eloy End.   Toenail most affected 1st.  All other systems are negative.  Nails were filed thin. Laser therapy was administered to 1-5 toenails bialaterally and patient tolerated the treatment well. All safety precautions were in place.   Single laser pass was done on non-affected nails.   Follow up in 8 weeks for laser # 6

## 2023-01-03 DIAGNOSIS — M25512 Pain in left shoulder: Secondary | ICD-10-CM | POA: Diagnosis not present

## 2023-01-13 DIAGNOSIS — M25512 Pain in left shoulder: Secondary | ICD-10-CM | POA: Diagnosis not present

## 2023-01-20 DIAGNOSIS — M25512 Pain in left shoulder: Secondary | ICD-10-CM | POA: Diagnosis not present

## 2023-01-27 DIAGNOSIS — M25512 Pain in left shoulder: Secondary | ICD-10-CM | POA: Diagnosis not present

## 2023-01-31 ENCOUNTER — Ambulatory Visit (HOSPITAL_COMMUNITY)
Admission: RE | Admit: 2023-01-31 | Discharge: 2023-01-31 | Disposition: A | Discharge status: Home / Self Care | Payer: Medicare Other | Source: Ambulatory Visit | Attending: Cardiovascular Disease | Admitting: Cardiovascular Disease

## 2023-01-31 DIAGNOSIS — I6523 Occlusion and stenosis of bilateral carotid arteries: Secondary | ICD-10-CM | POA: Diagnosis present

## 2023-01-31 DIAGNOSIS — I10 Essential (primary) hypertension: Secondary | ICD-10-CM | POA: Diagnosis not present

## 2023-01-31 DIAGNOSIS — E7800 Pure hypercholesterolemia, unspecified: Secondary | ICD-10-CM | POA: Diagnosis present

## 2023-01-31 DIAGNOSIS — I2510 Atherosclerotic heart disease of native coronary artery without angina pectoris: Secondary | ICD-10-CM | POA: Insufficient documentation

## 2023-02-01 ENCOUNTER — Encounter: Payer: Self-pay | Admitting: Cardiology

## 2023-02-02 ENCOUNTER — Telehealth: Payer: Self-pay

## 2023-02-02 DIAGNOSIS — I6523 Occlusion and stenosis of bilateral carotid arteries: Secondary | ICD-10-CM

## 2023-02-02 NOTE — Telephone Encounter (Signed)
Call to patient to discuss 1-39% bilateral carotid artery stenosis. Patient verbalizes understanding and agrees to repeat dopplers in 1 year. Orders placed.

## 2023-02-02 NOTE — Telephone Encounter (Signed)
-----   Message from Armanda Magic sent at 02/01/2023  7:07 AM EDT ----- 1-39% bilateral carotid artery stenosis - repeat dopplers in 1 year

## 2023-02-03 DIAGNOSIS — M25512 Pain in left shoulder: Secondary | ICD-10-CM | POA: Diagnosis not present

## 2023-02-09 DIAGNOSIS — M25512 Pain in left shoulder: Secondary | ICD-10-CM | POA: Diagnosis not present

## 2023-02-16 DIAGNOSIS — M25512 Pain in left shoulder: Secondary | ICD-10-CM | POA: Diagnosis not present

## 2023-02-18 ENCOUNTER — Ambulatory Visit (INDEPENDENT_AMBULATORY_CARE_PROVIDER_SITE_OTHER): Payer: Medicare Other | Admitting: *Deleted

## 2023-02-18 DIAGNOSIS — B351 Tinea unguium: Secondary | ICD-10-CM

## 2023-02-18 NOTE — Progress Notes (Signed)
Patient presents today for the 6th laser treatment. Diagnosed with mycotic nail infection by Dr. Eloy End.   Toenail most affected 1st. He is happy with the progress so far.  All other systems are negative.  Nails were filed thin. Laser therapy was administered to 1-5 toenails bilaterally and patient tolerated the treatment well. All safety precautions were in place.    Patient has completed the recommended laser treatments. He will follow up with Dr. Eloy End in 3 months to evaluate progress.

## 2023-02-23 DIAGNOSIS — Z96612 Presence of left artificial shoulder joint: Secondary | ICD-10-CM | POA: Diagnosis not present

## 2023-02-23 DIAGNOSIS — Z471 Aftercare following joint replacement surgery: Secondary | ICD-10-CM | POA: Diagnosis not present

## 2023-02-24 DIAGNOSIS — H02831 Dermatochalasis of right upper eyelid: Secondary | ICD-10-CM | POA: Diagnosis not present

## 2023-02-24 DIAGNOSIS — H25813 Combined forms of age-related cataract, bilateral: Secondary | ICD-10-CM | POA: Diagnosis not present

## 2023-02-24 DIAGNOSIS — H04123 Dry eye syndrome of bilateral lacrimal glands: Secondary | ICD-10-CM | POA: Diagnosis not present

## 2023-02-24 DIAGNOSIS — H02834 Dermatochalasis of left upper eyelid: Secondary | ICD-10-CM | POA: Diagnosis not present

## 2023-02-24 DIAGNOSIS — H43811 Vitreous degeneration, right eye: Secondary | ICD-10-CM | POA: Diagnosis not present

## 2023-02-24 DIAGNOSIS — H17822 Peripheral opacity of cornea, left eye: Secondary | ICD-10-CM | POA: Diagnosis not present

## 2023-02-24 DIAGNOSIS — H0102B Squamous blepharitis left eye, upper and lower eyelids: Secondary | ICD-10-CM | POA: Diagnosis not present

## 2023-02-24 DIAGNOSIS — H0102A Squamous blepharitis right eye, upper and lower eyelids: Secondary | ICD-10-CM | POA: Diagnosis not present

## 2023-02-25 DIAGNOSIS — Z23 Encounter for immunization: Secondary | ICD-10-CM | POA: Diagnosis not present

## 2023-02-28 DIAGNOSIS — D649 Anemia, unspecified: Secondary | ICD-10-CM | POA: Diagnosis not present

## 2023-02-28 DIAGNOSIS — K31A0 Gastric intestinal metaplasia, unspecified: Secondary | ICD-10-CM | POA: Diagnosis not present

## 2023-02-28 DIAGNOSIS — K219 Gastro-esophageal reflux disease without esophagitis: Secondary | ICD-10-CM | POA: Diagnosis not present

## 2023-03-30 DIAGNOSIS — D509 Iron deficiency anemia, unspecified: Secondary | ICD-10-CM | POA: Diagnosis not present

## 2023-04-10 ENCOUNTER — Other Ambulatory Visit: Payer: Self-pay | Admitting: Cardiology

## 2023-04-11 ENCOUNTER — Ambulatory Visit
Admission: RE | Admit: 2023-04-11 | Discharge: 2023-04-11 | Disposition: A | Discharge status: Home / Self Care | Payer: Medicare Other | Source: Ambulatory Visit | Attending: Gastroenterology | Admitting: Gastroenterology

## 2023-04-11 ENCOUNTER — Other Ambulatory Visit: Payer: Self-pay | Admitting: Gastroenterology

## 2023-04-11 DIAGNOSIS — Z9889 Other specified postprocedural states: Secondary | ICD-10-CM | POA: Diagnosis not present

## 2023-04-11 DIAGNOSIS — R198 Other specified symptoms and signs involving the digestive system and abdomen: Secondary | ICD-10-CM

## 2023-04-13 ENCOUNTER — Ambulatory Visit (INDEPENDENT_AMBULATORY_CARE_PROVIDER_SITE_OTHER): Payer: Medicare Other | Admitting: Podiatry

## 2023-04-13 ENCOUNTER — Encounter: Payer: Self-pay | Admitting: Podiatry

## 2023-04-13 VITALS — Ht 69.0 in | Wt 196.2 lb

## 2023-04-13 DIAGNOSIS — B353 Tinea pedis: Secondary | ICD-10-CM

## 2023-04-13 DIAGNOSIS — B351 Tinea unguium: Secondary | ICD-10-CM

## 2023-04-13 MED ORDER — CLOTRIMAZOLE-BETAMETHASONE 1-0.05 % EX CREA: 1.0000 | TOPICAL_CREAM | Freq: Every day | CUTANEOUS | 0 refills | Status: AC

## 2023-04-13 NOTE — Progress Notes (Unsigned)
Subjective:  Patient ID: Kenneth Poole, male    DOB: 05-17-1946,  MRN: [id number]  Kenneth Poole presents to clinic today for:  follow up s/p laser therapy for mycotic toenails. Patient has completed the therapy. Was also using Formula 7 in conjunction with laser therapy.  Patient is pleased with results of laser therapy.   Patient has new skin eruption on dorsal aspect of both feet. Does not itch, but is dry and scaly. Has not attempted treatment to areas.  Chief Complaint  Patient presents with   Nail Problem    Pt is here for RFC not a diabetic, PCP is Dr Drema Pry and LOV was last month.   PCP is Soundra Pilon, FNP.  Allergies  Allergen Reactions   Apple Cider Vinegar Anaphylaxis, Other (See Comments) and Shortness Of Breath    No organic    Bee Pollen Anaphylaxis, Swelling and Rash   Erythromycin Base Anaphylaxis, Hives and Swelling    To any medication that has mycin ANY MED IN THE MYCIN FAMILY   Other Anaphylaxis    ORGANIC VINEGAR - severe hypotension   Rosuvastatin Calcium Other (See Comments)    Severe myalgias on 5mg  twice weekly   Sulfa Antibiotics Other (See Comments)   Tetracycline Swelling    Swelling in hands and face   Ace Inhibitors    Atorvastatin     Myalgias on 10-20mg     Bee Venom Swelling   Fenofibrate Other (See Comments)    Muscle aches    Livalo [Pitavastatin]     Myalgias on 1mg    Simcor [Niacin-Simvastatin Er]     Elevated CPK    Review of Systems: Negative except as noted in the HPI.  Objective: No changes noted in today's physical examination. There were no vitals filed for this visit.  Kenneth Poole is a pleasant 77 y.o. male in NAD. AAO x 3.  Vascular Examination: Capillary refill time <3 seconds b/l LE. Palpable pedal pulses b/l LE. Digital hair present b/l. No pedal edema b/l. Skin temperature gradient WNL b/l. No varicosities b/l. Marland Kitchen  Dermatological Examination: Pedal skin with normal turgor, texture and tone b/l. No  open wounds. No interdigital macerations b/l. Toenails 1-5  b/l thickened, discolored, dystrophic with subungual debris distal 1/3 of nailplates. There is pain on palpation to dorsal aspect of nailplates. There is clearing proximal 2/3 of nailplates.  Serpiginous skin eruption noted dorsal aspect of both feet. Areas are dry and scaly. No erythema, no edema, no weeping, no blistering, no fluctuance..  Neurological Examination: Protective sensation intact with 10 gram monofilament b/l LE. Vibratory sensation intact b/l LE.   Musculoskeletal Examination: Normal muscle strength 5/5 to all lower extremity muscle groups bilaterally. No pain, crepitus or joint limitation noted with ROM b/l LE. No gross bony pedal deformities b/l. Patient ambulates independently without assistive aids.  Assessment/Plan: 1. Dermatophytosis of nail   2. Tinea pedis of both feet     Meds ordered this encounter  Medications   clotrimazole-betamethasone (LOTRISONE) cream    Sig: Apply 1 Application topically daily.    Dispense:  30 g    Refill:  0   -Consent given for treatment as described below: -Examined patient. -Patient has completed laser therapy and is pleased with results of treatment. Re-evaluate at next visit. -Continue supportive shoe gear daily. -Toenails 1-5 b/l were debrided in length and girth with sterile nail nippers and dremel without iatrogenic bleeding.  -For tinea pedis, Rx sent to pharmacy for Ketoconazole  Cream 2% to be applied once daily for six weeks. -Patient/POA to call should there be question/concern in the interim.   Return in about 3 months (around 07/14/2023).  Freddie Breech, DPM      [place] LOCATION: [address], [place] [address]                   Office [phone]   [facility] LOCATION: [address] [place], [place] [address] Office [phone]

## 2023-04-15 ENCOUNTER — Telehealth: Payer: Self-pay

## 2023-04-15 ENCOUNTER — Encounter: Payer: Self-pay | Admitting: Cardiology

## 2023-04-15 ENCOUNTER — Other Ambulatory Visit (HOSPITAL_COMMUNITY): Payer: Self-pay

## 2023-04-15 ENCOUNTER — Ambulatory Visit: Payer: Medicare Other | Attending: Cardiology | Admitting: Cardiology

## 2023-04-15 VITALS — BP 116/62 | HR 76 | Ht 69.0 in | Wt 204.0 lb

## 2023-04-15 DIAGNOSIS — I2510 Atherosclerotic heart disease of native coronary artery without angina pectoris: Secondary | ICD-10-CM | POA: Insufficient documentation

## 2023-04-15 DIAGNOSIS — Z79899 Other long term (current) drug therapy: Secondary | ICD-10-CM | POA: Insufficient documentation

## 2023-04-15 DIAGNOSIS — I10 Essential (primary) hypertension: Secondary | ICD-10-CM | POA: Diagnosis not present

## 2023-04-15 DIAGNOSIS — E785 Hyperlipidemia, unspecified: Secondary | ICD-10-CM | POA: Insufficient documentation

## 2023-04-15 DIAGNOSIS — I6523 Occlusion and stenosis of bilateral carotid arteries: Secondary | ICD-10-CM | POA: Insufficient documentation

## 2023-04-15 NOTE — Addendum Note (Signed)
Addended by: Luellen Pucker on: 04/15/2023 10:09 AM   Modules accepted: Orders

## 2023-04-15 NOTE — Patient Instructions (Signed)
Medication Instructions:  Your physician recommends that you continue on your current medications as directed. Please refer to the Current Medication list given to you today.  *If you need a refill on your cardiac medications before your next appointment, please call your pharmacy*   Lab Work: Please complete a FASTING lipid panel and an ALT in our lab before you leave today.  If you have labs (blood work) drawn today and your tests are completely normal, you will receive your results only by: MyChart Message (if you have MyChart) OR A paper copy in the mail If you have any lab test that is abnormal or we need to change your treatment, we will call you to review the results.   Testing/Procedures: Your physician has requested that you have a carotid duplex in September 2025. This test is an ultrasound of the carotid arteries in your neck. It looks at blood flow through these arteries that supply the brain with blood. Allow one hour for this exam. There are no restrictions or special instructions.    Follow-Up: At [facility], you and your health needs are our priority.  As part of our continuing mission to provide you with exceptional heart care, we have created designated Provider Care Teams.  These Care Teams include your primary Cardiologist (physician) and Advanced Practice Providers (APPs -  Physician Assistants and Nurse Practitioners) who all work together to provide you with the care you need, when you need it.  We recommend signing up for the patient portal called "MyChart".  Sign up information is provided on this After Visit Summary.  MyChart is used to connect with patients for Virtual Visits (Telemedicine).  Patients are able to view lab/test results, encounter notes, upcoming appointments, etc.  Non-urgent messages can be sent to your provider as well.   To learn more about what you can do with MyChart, go to [email or web address].    Your next appointment:   1  year(s)  Provider:   Armanda Magic, MD

## 2023-04-15 NOTE — Progress Notes (Signed)
Cardiology Office Note:    Date:  04/15/2023   ID:  Kenneth Poole, DOB 03-24-1946, MRN [id number]  PCP:  Soundra Pilon, FNP  Cardiologist:  Armanda Magic, MD    Referring MD: Soundra Pilon, FNP   Chief Complaint  Patient presents with   Coronary Artery Disease   Hypertension   Hyperlipidemia    History of Present Illness:    Kenneth Poole is a 77 y.o. male with a hx of ASCAD s/p PTCA of circumflex lesion in 2004 and cath in June 2009 with 50% LAD.  He also has HTN and dyslipidemia.  Nuclear stress test for CP 01/2017 showed no ischemia.    He is here today for followup and is doing well.  He denies any anginal chest pain or pressure, SOB, DOE, PND, orthopnea, LE edema, dizziness, palpitations or syncope. He has some chest burning from time to time from GERD and resolves with belching. He is compliant with his meds and is tolerating meds with no SE.    Past Medical History:  Diagnosis Date   Arthritis    Back pain    Carotid artery disease (HCC)    1-39% bilateral carotid stenosis  by dopplers 01/2023   Coronary artery disease    s.p PTCA of circumflex lesion in 2004, cardiologist Dr Mayford Knife, nonobstructive CAD on CATH in June 2009 with 50% LAD   Dermatitis    Diverticulosis    Dyslipidemia    GERD (gastroesophageal reflux disease)    severe GERD, has to sleep elevated on pillows   History of kidney stones    Hypercholesteremia    Hypercholesteremia    Hypertension    Obesity    Peripheral neuropathy    Peripheral vascular disease (HCC)    Psoriasis    Urticaria     Past Surgical History:  Procedure Laterality Date   APPENDECTOMY     BACK SURGERY     CARDIAC CATHETERIZATION  10/23/2007   and 2004  DES x 1   CHOLECYSTECTOMY     CORONARY STENT PLACEMENT     HERNIA REPAIR     umbilical hernia repair   KIDNEY STONE SURGERY     NO PAST SURGERIES Bilateral    REVERSE SHOULDER ARTHROPLASTY Left 12/02/2022   Procedure: REVERSE SHOULDER ARTHROPLASTY;   Surgeon: Francena Hanly, MD;  Location: [facility];  Service: Orthopedics;  Laterality: Left;  ; please move up to follow if room opens at 12:30pm   TONSILLECTOMY      Current Medications: Current Meds  Medication Sig   albuterol (VENTOLIN HFA) 108 (90 Base) MCG/ACT inhaler Inhale 2 puffs into the lungs every 6 (six) hours as needed for wheezing or shortness of breath.   aspirin 81 MG tablet Take 81 mg by mouth daily.   colestipol (COLESTID) 1 g tablet Take 2 g by mouth daily.   doxazosin (CARDURA) 4 MG tablet Take 1 tablet (4 mg total) by mouth daily. Please keep January 2024 appt further refills   EPINEPHrine (EPIPEN IJ) Inject as directed as needed (Anaphylaxis).    Evolocumab (REPATHA SURECLICK) 140 MG/ML SOAJ INJECT 1 SYRINGE SUBCUTANEOUSLY EVERY 14 DAYS   ezetimibe (ZETIA) 10 MG tablet TAKE 1 TABLET BY MOUTH ONCE DAILY . APPOINTMENT REQUIRED FOR FUTURE REFILLS (Patient taking differently: Take 10 mg by mouth daily.)   Ferrous Sulfate (IRON) 325 (65 Fe) MG TABS Take 1 tablet by mouth in the morning and at bedtime.   hydrochlorothiazide (HYDRODIURIL) 25 MG tablet Take  1 tablet (25 mg total) by mouth daily.   icosapent Ethyl (VASCEPA) 1 g capsule Take 2 capsules (2 g total) by mouth 2 (two) times daily.   levothyroxine (SYNTHROID) 75 MCG tablet Take 75 mcg by mouth daily before breakfast.   lisinopril (ZESTRIL) 20 MG tablet Take 1 tablet (20 mg total) by mouth daily.   metoprolol tartrate (LOPRESSOR) 25 MG tablet Take 1/2 (one-half) tablet by mouth twice daily (Patient taking differently: Take 12.5 mg by mouth 2 (two) times daily.)   Multiple Vitamin (MULTIVITAMIN) capsule Take 1 capsule by mouth daily.   nitroGLYCERIN (NITROSTAT) 0.4 MG SL tablet DISSOLVE ONE TABLET UNDER THE TONGUE EVERY 5 MINUTES AS NEEDED FOR CHEST PAIN.  DO NOT EXCEED A TOTAL OF 3 DOSES IN 15 MINUTES (Patient taking differently: Place 0.4 mg under the tongue every 5 (five) minutes as needed for chest pain.)    omeprazole (PRILOSEC) 40 MG capsule Take 40 mg by mouth daily.   potassium chloride SA (KLOR-CON M) 20 MEQ tablet Take 1 tablet (20 mEq total) by mouth daily. Please keep appointment in January 2024 for any more refills. Thank you   sucralfate (CARAFATE) 1 g tablet Take 1 g by mouth 2 (two) times daily.   triamcinolone cream (KENALOG) 0.1 % Apply 1 Application topically 2 (two) times daily.   [DISCONTINUED] levothyroxine (SYNTHROID) 50 MCG tablet Take 75 mcg by mouth daily before breakfast.     Allergies:   Apple cider vinegar, Bee pollen, Erythromycin base, Other, Rosuvastatin calcium, Sulfa antibiotics, Tetracycline, Ace inhibitors, Atorvastatin, Bee venom, Fenofibrate, Livalo [pitavastatin], and Simcor [niacin-simvastatin er]   Social History   Socioeconomic History   Marital status: Married    Spouse name: Not on file   Number of children: Not on file   Years of education: Not on file   Highest education level: Not on file  Occupational History   Not on file  Tobacco Use   Smoking status: Former    Current packs/day: 0.00    Types: Cigarettes    Quit date: 05/24/1989    Years since quitting: 33.9   Smokeless tobacco: Never  Vaping Use   Vaping status: Never Used  Substance and Sexual Activity   Alcohol use: No   Drug use: No   Sexual activity: Not Currently  Other Topics Concern   Not on file  Social History Narrative   Not on file   Social Determinants of Health   Financial Resource Strain: Not on file  Food Insecurity: Not on file  Transportation Needs: Not on file  Physical Activity: Not on file  Stress: Not on file  Social Connections: Not on file     Family History: The patient's family history includes Alzheimer's disease in his mother; Heart attack in his father.  ROS:   Please see the history of present illness.    ROS  All other systems reviewed and negative.   EKGs/Labs/Other Studies Reviewed:    The following studies were reviewed  today:   Recent Labs: 06/22/2022: ALT 39 11/22/2022: BUN 16; Creatinine, Ser 0.86; Hemoglobin 11.8; Platelets 153; Potassium 4.2; Sodium 138   Recent Lipid Panel    Component Value Date/Time   CHOL 97 (L) 06/22/2022 0842   TRIG 210 (H) 06/22/2022 0842   HDL 40 06/22/2022 0842   CHOLHDL 2.4 06/22/2022 0842   CHOLHDL 5.9 (H) 11/27/2015 0801   VLDL 38 (H) 11/27/2015 0801   LDLCALC 24 06/22/2022 0842   LDLDIRECT 146 (H) 04/09/2015 0802  Physical Exam:    VS:  BP 116/62   Pulse 76   Ht 5\' 9"  (1.753 m)   Wt 204 lb (92.5 kg)   BMI 30.13 kg/m     Wt Readings from Last 3 Encounters:  04/15/23 204 lb (92.5 kg)  04/13/23 196 lb 3.4 oz (89 kg)  12/02/22 196 lb 3.4 oz (89 kg)    GEN: Well nourished, well developed in no acute distress HEENT: Normal NECK: No JVD; No carotid bruits LYMPHATICS: No lymphadenopathy CARDIAC:RRR, no murmurs, rubs, gallops RESPIRATORY:  Clear to auscultation without rales, wheezing or rhonchi  ABDOMEN: Soft, non-tender, non-distended MUSCULOSKELETAL:  No edema; No deformity  SKIN: Warm and dry NEUROLOGIC:  Alert and oriented x 3 PSYCHIATRIC:  Normal affect  ASSESSMENT:    1. Coronary artery disease involving native coronary artery of native heart without angina pectoris   2. Primary hypertension   3. Bilateral carotid artery stenosis   4. Hyperlipidemia LDL goal <70      PLAN:    In order of problems listed above:    1.  ASCAD -s/p PTCA of circumflex lesion in 2004 and cath in June 2009 with 50% LAD.   -Nuclear stress test 01/2017 showed no ischemia. -Stress PET CT low risk with normal LV function myocardial blood flow reserve could not be reported there were severe coronary artery calcifications in the LAD left circumflex and RCA. -He denies any anginal symptoms today -Continue prescription drug managed with aspirin 81 mg daily, Repatha, Zetia 10 mg daily, Vascepa 2 g twice daily, Lopressor 12.5 mg twice daily with as needed refills  2.   HTN  -BP is controlled on exam today -Continue prescription drug management with lisinopril 20 mg daily, Lopressor 12.5 mg twice daily, HCTZ 25 mg daily, doxazosin 4 mg daily with as needed refills -I have personally reviewed and interpreted outside labs performed by patient's PCP which showed serum creatinine 0.86 and potassium 4.2 on 11/22/2022  3.  Bilateral carotid artery stenosis  -Carotid Dopplers 01/2023 showed 1-39% bilateral carotid stenosis  -Repeat carotid Dopplers in 02/11/2024 -Continue aspirin 81 mg daily and Repatha   4.  Hyperlipidemia  -LDL goal < 70.  -Statin intolerant -I have personally reviewed and interpreted outside labs performed by patient's PCP which showed LDL 24 and HDL 40 on 06/22/2022 will repeat FLP -Check FLP and ALT -Continue prescription drug management with Repatha, Vascepa 2 g twice daily and Zetia 10 mg daily with as needed refills  Medication Adjustments/Labs and Tests Ordered: Current medicines are reviewed at length with the patient today.  Concerns regarding medicines are outlined above.  No orders of the defined types were placed in this encounter.   No orders of the defined types were placed in this encounter.   Signed, Armanda Magic, MD  04/15/2023 9:29 AM    [facility]

## 2023-04-15 NOTE — Telephone Encounter (Signed)
Pharmacy Patient Advocate Encounter   Received notification from CoverMyMeds that prior authorization for REPATHA is required/requested.   Insurance verification completed.   The patient is insured through [facility]  .   Per test claim: PA required; PA submitted to above mentioned insurance via CoverMyMeds Key/confirmation #/EOC [id number] Status is pending

## 2023-04-15 NOTE — Addendum Note (Signed)
Addended by: Luellen Pucker on: 04/15/2023 09:58 AM   Modules accepted: Orders

## 2023-04-16 LAB — ALT: ALT: 41 [IU]/L (ref 0–44)

## 2023-04-16 LAB — LIPID PANEL
Chol/HDL Ratio: 2.6 ratio (ref 0.0–5.0)
Cholesterol, Total: 103 mg/dL (ref 100–199)
HDL: 40 mg/dL
LDL Chol Calc (NIH): 30 mg/dL (ref 0–99)
Triglycerides: 212 mg/dL — ABNORMAL HIGH (ref 0–149)
VLDL Cholesterol Cal: 33 mg/dL (ref 5–40)

## 2023-04-19 ENCOUNTER — Other Ambulatory Visit (HOSPITAL_COMMUNITY): Payer: Self-pay

## 2023-04-19 NOTE — Telephone Encounter (Signed)
Pharmacy Patient Advocate Encounter  Received notification from [facility] MEDICARE that Prior Authorization for REPATHA has been APPROVED from 04/18/23 to 04/17/24. Ran test claim, Copay is $133. This test claim was processed through [facility]- copay amounts may vary at other pharmacies due to pharmacy/plan contracts, or as the patient moves through the different stages of their insurance plan.

## 2023-05-27 DIAGNOSIS — Z6833 Body mass index (BMI) 33.0-33.9, adult: Secondary | ICD-10-CM | POA: Diagnosis not present

## 2023-05-27 DIAGNOSIS — Z20820 Contact with and (suspected) exposure to varicella: Secondary | ICD-10-CM | POA: Diagnosis not present

## 2023-05-27 DIAGNOSIS — J320 Chronic maxillary sinusitis: Secondary | ICD-10-CM | POA: Diagnosis not present

## 2023-05-27 DIAGNOSIS — R059 Cough, unspecified: Secondary | ICD-10-CM | POA: Diagnosis not present

## 2023-05-27 DIAGNOSIS — Z20822 Contact with and (suspected) exposure to covid-19: Secondary | ICD-10-CM | POA: Diagnosis not present

## 2023-05-27 DIAGNOSIS — R053 Chronic cough: Secondary | ICD-10-CM | POA: Diagnosis not present

## 2023-06-14 ENCOUNTER — Other Ambulatory Visit: Payer: Self-pay | Admitting: Cardiology

## 2023-06-15 ENCOUNTER — Other Ambulatory Visit: Payer: Self-pay | Admitting: Cardiology

## 2023-06-16 MED ORDER — POTASSIUM CHLORIDE CRYS ER 20 MEQ PO TBCR: 20.0000 meq | EXTENDED_RELEASE_TABLET | Freq: Every day | ORAL | 3 refills | Status: DC

## 2023-06-30 ENCOUNTER — Other Ambulatory Visit: Payer: Self-pay | Admitting: Cardiology

## 2023-07-12 ENCOUNTER — Ambulatory Visit (INDEPENDENT_AMBULATORY_CARE_PROVIDER_SITE_OTHER): Payer: Medicare Other | Admitting: Podiatry

## 2023-07-12 ENCOUNTER — Encounter: Payer: Self-pay | Admitting: Podiatry

## 2023-07-12 ENCOUNTER — Other Ambulatory Visit: Payer: Self-pay | Admitting: Cardiology

## 2023-07-12 DIAGNOSIS — I739 Peripheral vascular disease, unspecified: Secondary | ICD-10-CM

## 2023-07-12 DIAGNOSIS — M79674 Pain in right toe(s): Secondary | ICD-10-CM | POA: Diagnosis not present

## 2023-07-12 DIAGNOSIS — B351 Tinea unguium: Secondary | ICD-10-CM | POA: Diagnosis not present

## 2023-07-12 DIAGNOSIS — M79675 Pain in left toe(s): Secondary | ICD-10-CM | POA: Diagnosis not present

## 2023-07-12 NOTE — Progress Notes (Signed)
This patient returns to my office for at risk foot care.  This patient requires this care by a professional since this patient will be at risk due to having PVD.  This patient is unable to cut nails himself since the patient cannot reach his nails.These nails are painful walking and wearing shoes.  This patient presents for at risk foot care today.  General Appearance  Alert, conversant and in no acute stress.  Vascular  Dorsalis pedis and posterior tibial  pulses are weakly  palpable  bilaterally.  Capillary return is within normal limits  bilaterally. Temperature is within normal limits  bilaterally.  Neurologic  Senn-Weinstein monofilament wire test within normal limits  bilaterally. Muscle power within normal limits bilaterally.  Nails Thick disfigured discolored nails with subungual debris  from hallux to fifth toes bilaterally. No evidence of bacterial infection or drainage bilaterally.  Orthopedic  No limitations of motion  feet .  No crepitus or effusions noted.  No bony pathology or digital deformities noted.  Skin  normotropic skin with no porokeratosis noted bilaterally.  No signs of infections or ulcers noted.     Onychomycosis  Pain in right toes  Pain in left toes  Consent was obtained for treatment procedures.   Mechanical debridement of nails 1-5  bilaterally performed with a nail nipper.  Filed with dremel without incident.    Return office visit     3 months                Told patient to return for periodic foot care and evaluation due to potential at risk complications.   Helane Gunther DPM

## 2023-07-14 ENCOUNTER — Ambulatory Visit: Payer: Medicare Other | Admitting: Podiatry

## 2023-07-15 ENCOUNTER — Encounter (HOSPITAL_BASED_OUTPATIENT_CLINIC_OR_DEPARTMENT_OTHER): Payer: Self-pay | Admitting: Emergency Medicine

## 2023-07-15 ENCOUNTER — Other Ambulatory Visit: Payer: Self-pay

## 2023-07-15 ENCOUNTER — Emergency Department (HOSPITAL_BASED_OUTPATIENT_CLINIC_OR_DEPARTMENT_OTHER): Payer: Medicare Other

## 2023-07-15 ENCOUNTER — Emergency Department (HOSPITAL_BASED_OUTPATIENT_CLINIC_OR_DEPARTMENT_OTHER)
Admission: EM | Admit: 2023-07-15 | Discharge: 2023-07-15 | Disposition: A | Discharge status: Home / Self Care | Payer: Medicare Other | Attending: Emergency Medicine | Admitting: Emergency Medicine

## 2023-07-15 DIAGNOSIS — R0989 Other specified symptoms and signs involving the circulatory and respiratory systems: Secondary | ICD-10-CM | POA: Diagnosis not present

## 2023-07-15 DIAGNOSIS — R09A2 Foreign body sensation, throat: Secondary | ICD-10-CM | POA: Diagnosis not present

## 2023-07-15 DIAGNOSIS — M47812 Spondylosis without myelopathy or radiculopathy, cervical region: Secondary | ICD-10-CM | POA: Diagnosis not present

## 2023-07-15 DIAGNOSIS — Z7982 Long term (current) use of aspirin: Secondary | ICD-10-CM | POA: Insufficient documentation

## 2023-07-15 DIAGNOSIS — F458 Other somatoform disorders: Secondary | ICD-10-CM | POA: Diagnosis not present

## 2023-07-15 MED ORDER — LIDOCAINE VISCOUS HCL 2 % MT SOLN
15.0000 mL | Freq: Once | OROMUCOSAL | Status: AC
  Administered 2023-07-15: 15 mL via OROMUCOSAL
  Filled 2023-07-15: qty 15

## 2023-07-15 MED ORDER — LIDOCAINE VISCOUS HCL 2 % MT SOLN: 15.0000 mL | OROMUCOSAL | 0 refills | Status: AC | PRN

## 2023-07-15 NOTE — ED Provider Notes (Signed)
 [place] [facility] Provider Note   CSN: [id number] Arrival date & time: 07/15/23  1812     History  Chief Complaint  Patient presents with   food bolus    Kenneth Poole is a 78 y.o. male.  HPI 78 year old male presents with concern for a fishbone being stuck in his throat.  He was eating flounder with his wife and they found multiple small fishbone's and then when he swallowed he felt 1 get stuck on the right side of his throat.  There is some irritation pain whenever he swallows.  He is able to swallow and is tolerating his secretions and has had no vomiting or dyspnea.  No neck swelling or tenderness.  Home Medications Prior to Admission medications   Medication Sig Start Date End Date Taking? Authorizing Provider  lidocaine (XYLOCAINE) 2 % solution Use as directed 15 mLs in the mouth or throat every 4 (four) hours as needed for mouth pain. 07/15/23  Yes Pricilla Loveless, MD  albuterol (VENTOLIN HFA) 108 (90 Base) MCG/ACT inhaler Inhale 2 puffs into the lungs every 6 (six) hours as needed for wheezing or shortness of breath.    [provider]  aspirin 81 MG tablet Take 81 mg by mouth daily.    [provider]  clotrimazole-betamethasone (LOTRISONE) cream Apply 1 Application topically daily. 04/13/23   Freddie Breech, DPM  colestipol (COLESTID) 1 g tablet Take 2 g by mouth daily. 07/30/19   [provider]  doxazosin (CARDURA) 4 MG tablet Take 1 tablet by mouth once daily 06/30/23   Quintella Reichert, MD  EPINEPHrine (EPIPEN IJ) Inject as directed as needed (Anaphylaxis).     [provider]  Evolocumab (REPATHA SURECLICK) 140 MG/ML SOAJ INJECT 1 SYRINGE SUBCUTANEOUSLY EVERY 14 DAYS 04/12/23   Quintella Reichert, MD  ezetimibe (ZETIA) 10 MG tablet TAKE 1 TABLET BY MOUTH ONCE DAILY . APPOINTMENT REQUIRED FOR FUTURE REFILLS Patient taking differently: Take 10 mg by mouth daily. 07/27/22   Quintella Reichert, MD   Ferrous Sulfate (IRON) 325 (65 Fe) MG TABS Take 1 tablet by mouth in the morning and at bedtime.    [provider]  hydrochlorothiazide (HYDRODIURIL) 25 MG tablet Take 1 tablet by mouth once daily 06/30/23   Quintella Reichert, MD  icosapent Ethyl (VASCEPA) 1 g capsule Take 2 capsules by mouth twice daily 07/14/23   Quintella Reichert, MD  levothyroxine (SYNTHROID) 75 MCG tablet Take 75 mcg by mouth daily before breakfast.    [provider]  lisinopril (ZESTRIL) 20 MG tablet Take 1 tablet by mouth once daily 06/30/23   Quintella Reichert, MD  metoprolol tartrate (LOPRESSOR) 25 MG tablet Take 1/2 (one-half) tablet by mouth twice daily 06/30/23   Quintella Reichert, MD  Multiple Vitamin (MULTIVITAMIN) capsule Take 1 capsule by mouth daily.    [provider]  nitroGLYCERIN (NITROSTAT) 0.4 MG SL tablet DISSOLVE ONE TABLET UNDER THE TONGUE EVERY 5 MINUTES AS NEEDED FOR CHEST PAIN.  DO NOT EXCEED A TOTAL OF 3 DOSES IN 15 MINUTES 06/16/23   Turner, Cornelious Bryant, MD  omeprazole (PRILOSEC) 40 MG capsule Take 40 mg by mouth daily. 08/21/19   [provider]  ondansetron (ZOFRAN) 4 MG tablet Take 1 tablet (4 mg total) by mouth every 8 (eight) hours as needed for nausea or vomiting. 12/02/22   Shuford, French Ana, PA-C  oxyCODONE-acetaminophen (PERCOCET) 5-325 MG tablet Take 1 tablet by mouth every  4 (four) hours as needed (max 6 q). 12/02/22   Shuford, French Ana, PA-C  potassium chloride SA (KLOR-CON M) 20 MEQ tablet Take 1 tablet (20 mEq total) by mouth daily. 06/16/23   Quintella Reichert, MD  sucralfate (CARAFATE) 1 g tablet Take 1 g by mouth 2 (two) times daily. 08/19/22   [provider]  triamcinolone cream (KENALOG) 0.1 % Apply 1 Application topically 2 (two) times daily.    [provider]      Allergies    Apple cider vinegar, Bee pollen, Erythromycin base, Other, Rosuvastatin calcium, Sulfa antibiotics, Tetracycline, Ace inhibitors, Atorvastatin, Bee venom, Fenofibrate, Livalo  [pitavastatin], and Simcor [niacin-simvastatin er]    Review of Systems   Review of Systems  HENT:  Positive for sore throat. Negative for trouble swallowing.   Respiratory:  Negative for shortness of breath.     Physical Exam Updated Vital Signs BP (!) 167/73 (BP Location: Left Arm)   Pulse 79   Temp 97.9 F (36.6 C)   Resp 12   Ht 5\' 9"  (1.753 m)   Wt 93 kg   SpO2 99%   BMI 30.27 kg/m  Physical Exam Vitals and nursing note reviewed.  Constitutional:      General: He is not in acute distress.    Appearance: He is well-developed. He is not ill-appearing or diaphoretic.  HENT:     Head: Normocephalic and atraumatic.     Mouth/Throat:     Pharynx: Oropharynx is clear.  Neck:     Comments: No neck tenderness/swelling. Cardiovascular:     Rate and Rhythm: Normal rate and regular rhythm.     Heart sounds: Normal heart sounds.  Pulmonary:     Effort: Pulmonary effort is normal.     Breath sounds: Normal breath sounds. No stridor.  Abdominal:     General: There is no distension.     Palpations: Abdomen is soft.     Tenderness: There is no abdominal tenderness.  Skin:    General: Skin is warm and dry.  Neurological:     Mental Status: He is alert.     ED Results / Procedures / Treatments   Labs (all labs ordered are listed, but only abnormal results are displayed) Labs Reviewed - No data to display  EKG None  Radiology DG Neck Soft Tissue Result Date: 07/15/2023 CLINICAL DATA:  Fish bone in throat EXAM: NECK SOFT TISSUES - 1+ VIEW COMPARISON:  Cervical radiograph 06/13/2013 FINDINGS: Coarse carotid vascular calcification. Normal prevertebral soft tissue thickness. Multilevel degenerative changes. No definitive radiopaque foreign body is seen. IMPRESSION: No definitive radiopaque foreign body is seen. Multilevel degenerative changes. Electronically Signed   By: Jasmine Pang M.D.   On: 07/15/2023 19:55    Procedures Procedures    Medications Ordered in  ED Medications  lidocaine (XYLOCAINE) 2 % viscous mouth solution 15 mL (has no administration in time range)    ED Course/ Medical Decision Making/ A&P                                 Medical Decision Making Amount and/or Complexity of Data Reviewed Radiology: ordered and independent interpretation performed.    Details: No foreign body  Risk Prescription drug management.   Patient probably has a globus sensation from the fishbone causing a small scratch to his esophagus.  Will give a dose of viscous lidocaine.  Will give a prescription as well but  at this point given he is able to swallow and is well-appearing, he does not need an emergent endoscopy.  Very low suspicion for perforation.  Will refer him to his GI doctor as well as will give a referral to ENT.  Otherwise, I think he is stable for outpatient management and evaluation.  Will give return precautions.        Final Clinical Impression(s) / ED Diagnoses Final diagnoses:  Globus sensation    Rx / DC Orders ED Discharge Orders          Ordered    lidocaine (XYLOCAINE) 2 % solution  Every 4 hours PRN        07/15/23 2025              Pricilla Loveless, MD 07/15/23 2027

## 2023-07-15 NOTE — ED Triage Notes (Signed)
 Pt POV steady gait- c/o fishbone being stuck in throat for appx 45 min.  Pt speaking in full sentences, tolerating secretions.

## 2023-07-15 NOTE — Discharge Instructions (Signed)
 It is unclear if you do have a fishbone in your esophagus versus a scratch from the fishbone.  Follow-up with your gastroenterologist and/or the ENT listed.  If you develop new or worsening pain, chest pain, trouble breathing, inability to swallow, or any other new/concerning symptoms then return to the ER or call 911.

## 2023-08-06 ENCOUNTER — Other Ambulatory Visit: Payer: Self-pay | Admitting: Cardiology

## 2023-08-17 DIAGNOSIS — I10 Essential (primary) hypertension: Secondary | ICD-10-CM | POA: Diagnosis not present

## 2023-08-17 DIAGNOSIS — I700 Atherosclerosis of aorta: Secondary | ICD-10-CM | POA: Diagnosis not present

## 2023-08-22 DIAGNOSIS — I700 Atherosclerosis of aorta: Secondary | ICD-10-CM | POA: Diagnosis not present

## 2023-08-22 DIAGNOSIS — I10 Essential (primary) hypertension: Secondary | ICD-10-CM | POA: Diagnosis not present

## 2023-08-22 DIAGNOSIS — E039 Hypothyroidism, unspecified: Secondary | ICD-10-CM | POA: Diagnosis not present

## 2023-08-22 DIAGNOSIS — E785 Hyperlipidemia, unspecified: Secondary | ICD-10-CM | POA: Diagnosis not present

## 2023-08-25 DIAGNOSIS — R5383 Other fatigue: Secondary | ICD-10-CM | POA: Diagnosis not present

## 2023-08-25 DIAGNOSIS — K9089 Other intestinal malabsorption: Secondary | ICD-10-CM | POA: Diagnosis not present

## 2023-08-25 DIAGNOSIS — K31A0 Gastric intestinal metaplasia, unspecified: Secondary | ICD-10-CM | POA: Diagnosis not present

## 2023-08-25 DIAGNOSIS — D649 Anemia, unspecified: Secondary | ICD-10-CM | POA: Diagnosis not present

## 2023-08-25 DIAGNOSIS — K219 Gastro-esophageal reflux disease without esophagitis: Secondary | ICD-10-CM | POA: Diagnosis not present

## 2023-09-14 DIAGNOSIS — E039 Hypothyroidism, unspecified: Secondary | ICD-10-CM | POA: Diagnosis not present

## 2023-09-14 DIAGNOSIS — N400 Enlarged prostate without lower urinary tract symptoms: Secondary | ICD-10-CM | POA: Diagnosis not present

## 2023-09-14 DIAGNOSIS — K219 Gastro-esophageal reflux disease without esophagitis: Secondary | ICD-10-CM | POA: Diagnosis not present

## 2023-09-14 DIAGNOSIS — Z6832 Body mass index (BMI) 32.0-32.9, adult: Secondary | ICD-10-CM | POA: Diagnosis not present

## 2023-09-14 DIAGNOSIS — Z0000 Encounter for general adult medical examination without abnormal findings: Secondary | ICD-10-CM | POA: Diagnosis not present

## 2023-09-14 DIAGNOSIS — E291 Testicular hypofunction: Secondary | ICD-10-CM | POA: Diagnosis not present

## 2023-09-14 DIAGNOSIS — N529 Male erectile dysfunction, unspecified: Secondary | ICD-10-CM | POA: Diagnosis not present

## 2023-09-14 DIAGNOSIS — I10 Essential (primary) hypertension: Secondary | ICD-10-CM | POA: Diagnosis not present

## 2023-09-14 DIAGNOSIS — Z125 Encounter for screening for malignant neoplasm of prostate: Secondary | ICD-10-CM | POA: Diagnosis not present

## 2023-09-14 DIAGNOSIS — G629 Polyneuropathy, unspecified: Secondary | ICD-10-CM | POA: Diagnosis not present

## 2023-09-14 DIAGNOSIS — E785 Hyperlipidemia, unspecified: Secondary | ICD-10-CM | POA: Diagnosis not present

## 2023-09-14 DIAGNOSIS — D509 Iron deficiency anemia, unspecified: Secondary | ICD-10-CM | POA: Diagnosis not present

## 2023-09-14 DIAGNOSIS — K760 Fatty (change of) liver, not elsewhere classified: Secondary | ICD-10-CM | POA: Diagnosis not present

## 2023-09-14 DIAGNOSIS — I700 Atherosclerosis of aorta: Secondary | ICD-10-CM | POA: Diagnosis not present

## 2023-09-14 DIAGNOSIS — I6523 Occlusion and stenosis of bilateral carotid arteries: Secondary | ICD-10-CM | POA: Diagnosis not present

## 2023-09-15 DIAGNOSIS — I700 Atherosclerosis of aorta: Secondary | ICD-10-CM | POA: Diagnosis not present

## 2023-09-15 DIAGNOSIS — I10 Essential (primary) hypertension: Secondary | ICD-10-CM | POA: Diagnosis not present

## 2023-09-21 DIAGNOSIS — E785 Hyperlipidemia, unspecified: Secondary | ICD-10-CM | POA: Diagnosis not present

## 2023-09-21 DIAGNOSIS — I700 Atherosclerosis of aorta: Secondary | ICD-10-CM | POA: Diagnosis not present

## 2023-09-21 DIAGNOSIS — E039 Hypothyroidism, unspecified: Secondary | ICD-10-CM | POA: Diagnosis not present

## 2023-09-21 DIAGNOSIS — I10 Essential (primary) hypertension: Secondary | ICD-10-CM | POA: Diagnosis not present

## 2023-09-23 ENCOUNTER — Other Ambulatory Visit: Payer: Self-pay | Admitting: Cardiology

## 2023-09-26 MED ORDER — HYDROCHLOROTHIAZIDE 25 MG PO TABS: 25.0000 mg | ORAL_TABLET | Freq: Every day | ORAL | 1 refills | Status: AC

## 2023-09-26 MED ORDER — LISINOPRIL 20 MG PO TABS: 20.0000 mg | ORAL_TABLET | Freq: Every day | ORAL | 1 refills | Status: DC

## 2023-10-10 ENCOUNTER — Encounter: Payer: Self-pay | Admitting: Podiatry

## 2023-10-10 ENCOUNTER — Ambulatory Visit (INDEPENDENT_AMBULATORY_CARE_PROVIDER_SITE_OTHER): Payer: Medicare Other | Admitting: Podiatry

## 2023-10-10 DIAGNOSIS — I739 Peripheral vascular disease, unspecified: Secondary | ICD-10-CM

## 2023-10-10 DIAGNOSIS — B351 Tinea unguium: Secondary | ICD-10-CM

## 2023-10-10 DIAGNOSIS — M79675 Pain in left toe(s): Secondary | ICD-10-CM

## 2023-10-10 DIAGNOSIS — M79674 Pain in right toe(s): Secondary | ICD-10-CM

## 2023-10-10 NOTE — Progress Notes (Signed)
This patient returns to my office for at risk foot care.  This patient requires this care by a professional since this patient will be at risk due to having PVD.  This patient is unable to cut nails himself since the patient cannot reach his nails.These nails are painful walking and wearing shoes.  This patient presents for at risk foot care today.  General Appearance  Alert, conversant and in no acute stress.  Vascular  Dorsalis pedis and posterior tibial  pulses are weakly  palpable  bilaterally.  Capillary return is within normal limits  bilaterally. Temperature is within normal limits  bilaterally.  Neurologic  Senn-Weinstein monofilament wire test within normal limits  bilaterally. Muscle power within normal limits bilaterally.  Nails Thick disfigured discolored nails with subungual debris  from hallux to fifth toes bilaterally. No evidence of bacterial infection or drainage bilaterally.  Orthopedic  No limitations of motion  feet .  No crepitus or effusions noted.  No bony pathology or digital deformities noted.  Skin  normotropic skin with no porokeratosis noted bilaterally.  No signs of infections or ulcers noted.     Onychomycosis  Pain in right toes  Pain in left toes  Consent was obtained for treatment procedures.   Mechanical debridement of nails 1-5  bilaterally performed with a nail nipper.  Filed with dremel without incident.    Return office visit     3 months                Told patient to return for periodic foot care and evaluation due to potential at risk complications.   Helane Gunther DPM

## 2023-10-11 ENCOUNTER — Ambulatory Visit: Payer: Medicare Other | Admitting: Podiatry

## 2023-10-15 DIAGNOSIS — I700 Atherosclerosis of aorta: Secondary | ICD-10-CM | POA: Diagnosis not present

## 2023-10-15 DIAGNOSIS — I10 Essential (primary) hypertension: Secondary | ICD-10-CM | POA: Diagnosis not present

## 2023-10-19 DIAGNOSIS — E860 Dehydration: Secondary | ICD-10-CM | POA: Diagnosis not present

## 2023-10-19 DIAGNOSIS — R519 Headache, unspecified: Secondary | ICD-10-CM | POA: Diagnosis not present

## 2023-10-19 DIAGNOSIS — R42 Dizziness and giddiness: Secondary | ICD-10-CM | POA: Diagnosis not present

## 2023-10-20 ENCOUNTER — Other Ambulatory Visit: Payer: Self-pay

## 2023-10-20 ENCOUNTER — Emergency Department (HOSPITAL_COMMUNITY)

## 2023-10-20 ENCOUNTER — Emergency Department (HOSPITAL_COMMUNITY)
Admission: EM | Admit: 2023-10-20 | Discharge: 2023-10-20 | Disposition: A | Discharge status: Home / Self Care | Attending: Emergency Medicine | Admitting: Emergency Medicine

## 2023-10-20 ENCOUNTER — Encounter (HOSPITAL_COMMUNITY): Payer: Self-pay

## 2023-10-20 DIAGNOSIS — Z96612 Presence of left artificial shoulder joint: Secondary | ICD-10-CM | POA: Diagnosis not present

## 2023-10-20 DIAGNOSIS — R29818 Other symptoms and signs involving the nervous system: Secondary | ICD-10-CM | POA: Diagnosis not present

## 2023-10-20 DIAGNOSIS — I10 Essential (primary) hypertension: Secondary | ICD-10-CM | POA: Diagnosis not present

## 2023-10-20 DIAGNOSIS — I700 Atherosclerosis of aorta: Secondary | ICD-10-CM | POA: Diagnosis not present

## 2023-10-20 DIAGNOSIS — R42 Dizziness and giddiness: Secondary | ICD-10-CM | POA: Diagnosis not present

## 2023-10-20 DIAGNOSIS — Z7982 Long term (current) use of aspirin: Secondary | ICD-10-CM | POA: Insufficient documentation

## 2023-10-20 DIAGNOSIS — Z79899 Other long term (current) drug therapy: Secondary | ICD-10-CM | POA: Insufficient documentation

## 2023-10-20 DIAGNOSIS — R531 Weakness: Secondary | ICD-10-CM | POA: Diagnosis not present

## 2023-10-20 LAB — COMPREHENSIVE METABOLIC PANEL WITH GFR
ALT: 40 U/L (ref 0–44)
AST: 39 U/L (ref 15–41)
Albumin: 3.7 g/dL (ref 3.5–5.0)
Alkaline Phosphatase: 55 U/L (ref 38–126)
Anion gap: 7 (ref 5–15)
BUN: 14 mg/dL (ref 8–23)
CO2: 26 mmol/L (ref 22–32)
Calcium: 9 mg/dL (ref 8.9–10.3)
Chloride: 105 mmol/L (ref 98–111)
Creatinine, Ser: 0.92 mg/dL (ref 0.61–1.24)
GFR, Estimated: >60 mL/min (ref >60)
Glucose, Bld: 118 mg/dL — ABNORMAL HIGH (ref 70–99)
Potassium: 4 mmol/L (ref 3.5–5.1)
Sodium: 138 mmol/L (ref 135–145)
Total Bilirubin: 1.2 mg/dL (ref 0.0–1.2)
Total Protein: 6.4 g/dL — ABNORMAL LOW (ref 6.5–8.1)

## 2023-10-20 LAB — URINALYSIS, ROUTINE W REFLEX MICROSCOPIC
Bilirubin Urine: NEGATIVE
Glucose, UA: NEGATIVE mg/dL
Hgb urine dipstick: NEGATIVE
Ketones, ur: NEGATIVE mg/dL
Leukocytes,Ua: NEGATIVE
Nitrite: NEGATIVE
Protein, ur: NEGATIVE mg/dL
Specific Gravity, Urine: 1.012 (ref 1.005–1.030)
pH: 6 (ref 5.0–8.0)

## 2023-10-20 LAB — CBC
HCT: 32.4 % — ABNORMAL LOW (ref 39.0–52.0)
Hemoglobin: 11.3 g/dL — ABNORMAL LOW (ref 13.0–17.0)
MCH: 29.9 pg (ref 26.0–34.0)
MCHC: 34.9 g/dL (ref 30.0–36.0)
MCV: 85.7 fL (ref 80.0–100.0)
Platelets: 43 10*3/uL — ABNORMAL LOW (ref 150–400)
RBC: 3.78 MIL/uL — ABNORMAL LOW (ref 4.22–5.81)
RDW: 13.4 % (ref 11.5–15.5)
WBC: 7.5 10*3/uL (ref 4.0–10.5)
nRBC: 0 % (ref 0.0–0.2)

## 2023-10-20 LAB — TROPONIN I (HIGH SENSITIVITY): Troponin I (High Sensitivity): 4 ng/L (ref <18)

## 2023-10-20 MED ORDER — SODIUM CHLORIDE 0.9 % IV BOLUS
1000.0000 mL | Freq: Once | INTRAVENOUS | Status: AC
Start: 2023-10-20 — End: 2023-10-20
  Administered 2023-10-20: 1000 mL via INTRAVENOUS

## 2023-10-20 MED ORDER — MECLIZINE HCL 25 MG PO TABS
25.0000 mg | ORAL_TABLET | Freq: Three times a day (TID) | ORAL | 0 refills | Status: AC | PRN
Start: 2023-10-20 — End: ?

## 2023-10-20 NOTE — ED Notes (Signed)
 Back from MRI.

## 2023-10-20 NOTE — ED Triage Notes (Addendum)
 Pt to ED intermittent dizziness x weeks, also reports increased generalized weakness x weeks. Reports takes bp at home and yesterday was 90s systolic. Evaluated at walk in clinic for the same. BP 142/77 in triage. NAD. Denies vision changes. A&O X 4 in triage.

## 2023-10-20 NOTE — ED Provider Notes (Signed)
 [facility]ERGENC[facility] DEPARTMENT AT [facility] Provider Note   CSN: [id number] Arrival date & time: 10/20/23  1145     History  Chief Complaint  Patient presents with   Dizziness   Weakness    Kenneth Poole is a 78 y.o. male.  Patient here intermittent dizziness the last few days and weakness.  Worse with movement.  Does not feel like Kenneth Poole is in a pass out/has not passed out.  History of hypertension high cholesterol.  Denies any weakness numbness tingling.  Denies any vision loss.  Denies any cough sputum production.  Denies any recent surgery or travel.  The history is provided by the patient.       Home Medications Prior to Admission medications   Medication Sig Start Date End Date Taking? Authorizing Provider  albuterol  (VENTOLIN  HFA) 108 (90 Base) MCG/ACT inhaler Inhale 2 puffs into the lungs every 6 (six) hours as needed for wheezing or shortness of breath.    [provider]  aspirin  81 MG tablet Take 81 mg by mouth daily.    [provider]  clotrimazole -betamethasone  (LOTRISONE ) cream Apply 1 Application topically daily. 04/13/23   Luella Sager, DPM  colestipol (COLESTID) 1 g tablet Take 2 g by mouth daily. 07/30/19   [provider]  doxazosin  (CARDURA ) 4 MG tablet Take 1 tablet by mouth once daily 06/30/23   Jacqueline Matsu, MD  EPINEPHrine  (EPIPEN  IJ) Inject as directed as needed (Anaphylaxis).     [provider]  Evolocumab  (REPATHA  SURECLICK) 140 MG/ML SOAJ INJECT 1 SYRINGE SUBCUTANEOUSLY EVERY 14 DAYS 04/12/23   Jacqueline Matsu, MD  ezetimibe  (ZETIA ) 10 MG tablet Take 1 tablet by mouth once daily 08/09/23   Jacqueline Matsu, MD  Ferrous Sulfate (IRON) 325 (65 Fe) MG TABS Take 1 tablet by mouth in the morning and at bedtime.    [provider]  hydrochlorothiazide  (HYDRODIURIL ) 25 MG tablet Take 1 tablet (25 mg total) by mouth daily. 09/26/23   Jacqueline Matsu, MD  icosapent  Ethyl (VASCEPA ) 1 g capsule Take  2 capsules by mouth twice daily 07/14/23   Turner, Traci R, MD  levothyroxine (SYNTHROID) 75 MCG tablet Take 75 mcg by mouth daily before breakfast.    [provider]  lidocaine  (XYLOCAINE ) 2 % solution Use as directed 15 mLs in the mouth or throat every 4 (four) hours as needed for mouth pain. 07/15/23   Jerilynn Montenegro, MD  lisinopril  (ZESTRIL ) 20 MG tablet Take 1 tablet (20 mg total) by mouth daily. 09/26/23   Jacqueline Matsu, MD  metoprolol  tartrate (LOPRESSOR ) 25 MG tablet Take 0.5 tablets (12.5 mg total) by mouth 2 (two) times daily. 09/26/23   Jacqueline Matsu, MD  Multiple Vitamin (MULTIVITAMIN) capsule Take 1 capsule by mouth daily.    [provider]  nitroGLYCERIN  (NITROSTAT ) 0.4 MG SL tablet DISSOLVE ONE TABLET UNDER THE TONGUE EVERY 5 MINUTES AS NEEDED FOR CHEST PAIN.  DO NOT EXCEED A TOTAL OF 3 DOSES IN 15 MINUTES 06/16/23   Turner, Rufus Council, MD  omeprazole (PRILOSEC) 40 MG capsule Take 40 mg by mouth daily. 08/21/19   [provider]  ondansetron  (ZOFRAN ) 4 MG tablet Take 1 tablet (4 mg total) by mouth every 8 (eight) hours as needed for nausea or vomiting. 12/02/22   Shuford, Sherrlyn Dolores, PA-C  oxyCODONE -acetaminophen  (PERCOCET) 5-325 MG tablet Take 1 tablet by mouth every 4 (four) hours as needed (max 6 q). 12/02/22   Shuford,  Sherrlyn Dolores, PA-C  potassium chloride  SA (KLOR-CON  M) 20 MEQ tablet Take 1 tablet (20 mEq total) by mouth daily. 06/16/23   Jacqueline Matsu, MD  sucralfate (CARAFATE) 1 g tablet Take 1 g by mouth 2 (two) times daily. 08/19/22   [provider]  triamcinolone cream (KENALOG) 0.1 % Apply 1 Application topically 2 (two) times daily.    [provider]      Allergies    Apple cider vinegar, Bee pollen, Erythromycin base, Other, Rosuvastatin  calcium , Sulfa antibiotics, Tetracycline, Ace inhibitors, Atorvastatin , Bee venom, Fenofibrate , Livalo  [pitavastatin ], and Simcor [niacin-simvastatin er]    Review of Systems   Review of  Systems  Physical Exam Updated Vital Signs BP 103/83   Pulse (!) 58   Temp 98.1 F (36.7 C) (Oral)   Resp 17   Ht 5\' 9"  (1.753 m)   Wt 93.4 kg   SpO2 100%   BMI 30.42 kg/m  Physical Exam Vitals and nursing note reviewed.  Constitutional:      General: Kenneth Poole is not in acute distress.    Appearance: Kenneth Poole is well-developed. Kenneth Poole is not ill-appearing.  HENT:     Head: Normocephalic and atraumatic.     Nose: Nose normal.     Mouth/Throat:     Mouth: Mucous membranes are moist.  Eyes:     Extraocular Movements: Extraocular movements intact.     Conjunctiva/sclera: Conjunctivae normal.     Pupils: Pupils are equal, round, and reactive to light.  Cardiovascular:     Rate and Rhythm: Normal rate and regular rhythm.     Pulses: Normal pulses.     Heart sounds: Normal heart sounds. No murmur heard. Pulmonary:     Effort: Pulmonary effort is normal. No respiratory distress.     Breath sounds: Normal breath sounds.  Abdominal:     General: Abdomen is flat.     Palpations: Abdomen is soft.     Tenderness: There is no abdominal tenderness.  Musculoskeletal:        General: No swelling.     Cervical back: Normal range of motion and neck supple.  Skin:    General: Skin is warm and dry.     Capillary Refill: Capillary refill takes less than 2 seconds.  Neurological:     General: No focal deficit present.     Mental Status: Kenneth Poole is alert and oriented to person, place, and time.     Cranial Nerves: No cranial nerve deficit.     Sensory: No sensory deficit.     Motor: No weakness.     Coordination: Coordination normal.     Comments: 5+ out of 5 strength, normal sensation no drift, normal finger-to-nose finger, normal speech some mild horizontal nystagmus on exam.  Normal gait  Psychiatric:        Mood and Affect: Mood normal.     ED Results / Procedures / Treatments   Labs (all labs ordered are listed, but only abnormal results are displayed) Labs Reviewed  COMPREHENSIVE METABOLIC  PANEL WITH GFR - Abnormal; Notable for the following components:      Result Value   Glucose, Bld 118 (*)    Total Protein 6.4 (*)    All other components within normal limits  CBC - Abnormal; Notable for the following components:   RBC 3.78 (*)    Hemoglobin 11.3 (*)    HCT 32.4 (*)    Platelets 43 (*)    All other components within normal limits  URINALYSIS,  ROUTINE W REFLEX MICROSCOPIC  CBG MONITORING, ED  TROPONIN I (HIGH SENSITIVITY)    EKG EKG Interpretation Date/Time:  Thursday Oct 20 2023 11:52:20 EDT Ventricular Rate:  67 PR Interval:  172 QRS Duration:  84 QT Interval:  386 QTC Calculation: 407 R Axis:   14  Text Interpretation: Normal sinus rhythm Normal ECG When compared with ECG of 10-Apr-2020 14:36, PREVIOUS ECG IS PRESENT Confirmed by Lowery Rue [phone]) on 10/20/2023 4:56:18 PM  Radiology MR BRAIN WO CONTRAST Result Date: 10/20/2023 CLINICAL DATA:  Acute neurologic deficit EXAM: MRI HEAD WITHOUT CONTRAST TECHNIQUE: Multiplanar, multiecho pulse sequences of the brain and surrounding structures were obtained without intravenous contrast. COMPARISON:  None Available. FINDINGS: Brain: No acute infarct, mass effect or extra-axial collection. No acute or chronic hemorrhage. Normal white matter signal, parenchymal volume and CSF spaces. The midline structures are normal. Vascular: Normal flow voids. Skull and upper cervical spine: Normal calvarium and skull base. Visualized upper cervical spine and soft tissues are normal. Sinuses/Orbits:No paranasal sinus fluid levels or advanced mucosal thickening. No mastoid or middle ear effusion. Normal orbits. IMPRESSION: Normal brain MRI. Electronically Signed   By: Juanetta Nordmann M.D.   On: 10/20/2023 21:22   DG Chest Portable 1 View Result Date: 10/20/2023 CLINICAL DATA:  Weakness EXAM: PORTABLE CHEST 1 VIEW COMPARISON:  04/10/2020 FINDINGS: Hypoventilatory changes. No focal opacity, pleural effusion or pneumothorax. Stable  cardiomediastinal silhouette with aortic atherosclerosis. Left shoulder replacement IMPRESSION: No active disease. Hypoventilatory changes. Electronically Signed   By: Esmeralda Hedge M.D.   On: 10/20/2023 19:23   CT HEAD WO CONTRAST Result Date: 10/20/2023 CLINICAL DATA:  Vertigo, central. Intermittent dizziness for weeks and generalized weakness. EXAM: CT HEAD WITHOUT CONTRAST TECHNIQUE: Contiguous axial images were obtained from the base of the skull through the vertex without intravenous contrast. RADIATION DOSE REDUCTION: This exam was performed according to the departmental dose-optimization program which includes automated exposure control, adjustment of the mA and/or kV according to patient size and/or use of iterative reconstruction technique. COMPARISON:  None Available. FINDINGS: Brain: There is no evidence of an acute infarct, intracranial hemorrhage, mass, midline shift, or extra-axial fluid collection. Cerebral volume is within normal limits for age. The ventricles are normal in size. Vascular: Calcified atherosclerosis at the skull base. No hyperdense vessel. Skull: No acute fracture or suspicious lesion. Sinuses/Orbits: Visualized paranasal sinuses and mastoid air cells are clear. Unremarkable orbits. Other: None. IMPRESSION: Negative head CT. Electronically Signed   By: Aundra Lee M.D.   On: 10/20/2023 16:29    Procedures Procedures    Medications Ordered in ED Medications  sodium chloride  0.9 % bolus 1,000 mL (0 mLs Intravenous Stopped 10/20/23 1856)    ED Course/ Medical Decision Making/ A&P                                 Medical Decision Making Amount and/or Complexity of Data Reviewed Labs: ordered. Radiology: ordered.   VONTAE COURT is here with dizziness.  History of hypertension high cholesterol.  Differential diagnosis orthostatic process versus peripheral vertigo versus less likely stroke.  Seems less likely to be infectious process or ACS.  EKG showed sinus  rhythm.  No ischemic changes per my review interpretation of labs.  Orthostatics normal with me in the room.  Will check CBC BMP troponin chest x-ray head CT.  Will get MRI as well.  Overall head CT is unremarkable.  There is no  acute finding on head CT.  Urinalysis negative for infection.  No significant leukocytosis anemia or electrolyte abnormality.  Troponin normal.  Fluid bolus given.  Awaiting chest x-ray MRI.  My suspicion this could be some orthostasis but could be medication that Kenneth Poole is on causing some of this.  But will rule out stroke with MRI.  May offer him meclizine for vertigo.  MRI with no acute findings.  Recommend close follow-up with primary care doctor to go over his medications as I do think Kenneth Poole could be having some orthostasis.  I will prescribe him meclizine to use for any prolonged episodes of dizziness.  Told to follow-up with PCP and come back if symptoms worsen.  Discharged in good condition.  This chart was dictated using voice recognition software.  Despite best efforts to proofread,  errors can occur which can change the documentation meaning.         Final Clinical Impression(s) / ED Diagnoses Final diagnoses:  Dizziness    Rx / DC Orders ED Discharge Orders     None         Lowery Rue, DO 10/20/23 2211

## 2023-10-20 NOTE — ED Notes (Signed)
 Pt still at MRI

## 2023-10-31 DIAGNOSIS — I10 Essential (primary) hypertension: Secondary | ICD-10-CM | POA: Diagnosis not present

## 2023-10-31 DIAGNOSIS — D696 Thrombocytopenia, unspecified: Secondary | ICD-10-CM | POA: Diagnosis not present

## 2023-10-31 DIAGNOSIS — D509 Iron deficiency anemia, unspecified: Secondary | ICD-10-CM | POA: Diagnosis not present

## 2023-10-31 DIAGNOSIS — E785 Hyperlipidemia, unspecified: Secondary | ICD-10-CM | POA: Diagnosis not present

## 2023-11-02 DIAGNOSIS — Z96612 Presence of left artificial shoulder joint: Secondary | ICD-10-CM | POA: Diagnosis not present

## 2023-11-03 NOTE — Progress Notes (Unsigned)
 [facility] [phone]   [phone]  CONSULT NOTE  REFERRING PHYSICIAN: Sharry Deem NP  REASON FOR CONSULTATION:  Thrombocytopenia   HPI MARSHUN DUVA is a 78 y.o. male past medical history significant for hypertension, coronary artery disease (s/p stent placement), GERD, diverticulosis/diverticulitis, psoriasis, fatty liver disease (previously evaluated by Dr. Veronda Goody), carotid stenosis, peripheral vascular disease, hypothyroidism, and hypercholesterolemia.   The patient was referred to the clinic for new onset thrombocytopenia (plt count 43k on 10/20/23). Interestingly, the patient had normal platelet labs as recent as 08/25/23 in which case his plt count was 168k. His Hbg was 12.1, and WBC 7.1. Her chart review, he has never had thrombocytopenia based on labs from 2013-April 2025.    He has had new onset fatigue and dizziness/weakness. He is normally very active and this is unusual for him. He went to an UC and received IVF. He was instructed if no improvement in his symptoms, to seek ER evaluation. In the ER his platelet count was 43k.  His work up was unremarkable which included CT/MRI head and CXR. He was given meclizine  and IVF for some hypotension.   He saw his PCP for hospital follow up on 10/31/23.  He repeated labs at that time that showed persistent but somewhat improving platelet count of 70,000.  He continued to have persistent mild normocytic anemia with a hemoglobin of 11.6 and his white blood cell count was normal at 8.2.  The patient is taking an iron supplement twice a day.  His iron was 50 and his saturation was slightly low at 16.  His ferritin was normal at 154.  His ANA and HCV was negative.   Due to the persistent thrombocytopenia, the patient was referred to the clinic.  The patient has also had a longstanding history of mild normocytic anemia dating back to 2013.  The patient had a capsule endoscopy performed last year which  was reportedly normal.  He is hemoglobin is always over 11.    Overall the patient's main concern today continues to be related to fatigue and tiring easily.  He did tell me that he was found to have low testosterone  at his PCPs office a few weeks ago. He is not on any testosterone .   The patient denies any new medications or herbal supplements.  He did just start taking gabapentin 300 mg at night time but he states that he just started this after his ER evaluation on 10/20/2023 for his history of peripheral neuropathy.     Denies any recent abnormal bleeding including epistaxis, gingival bleeding, hemoptysis, hematemesis, melena, hematochezia.  The patient has some mild bruising but he states nothing out of the ordinary for him.  He has a small bruise on his left elbow from where he bumped it.  He takes an 81 mg aspirin .  No other blood thinners.    He denies any particular dietary habits such as being a vegan or vegetarian.  He estimates he eats red meat 1-2 times per week. He denies any other vitamin deficiencies besides iron deficiency.  He denies any personal history of autoimmune disorders although he does have psoriasis.   Denies any recent fever, chills, night sweats, or lymphadenopathy.  He denies unexplained weight loss. Denies any recent or frequent infections, specifically he denied recent upper respiratory infection, sore throat, cough, shortness of breath, skin infections, dysuria, or diarrhea. He denies blood transfusions, hepatitis exposure or HIV.  He denies any bleeding complications from surgery/procedures.  Per chart review, he does have a CT abdomen and pelvis on 06/16/23 which showed fatty liver infiltration. He has seen Dr. Veronda Goody from GI.   Regarding his anemia. He denies history of bariatric surgery, abdominal pain, or constipation. He denies CKD.   His mother had hypertension and thyroid  dysfunction. His father had a MI. His brother had MI and Alzheimer's.   The patient  worked in Education administrator.  The patient is married.  He has 2 children.  He is a former smoker and chew tobacco having quit 30 years ago.  He smoked approximately 10 years averaging 1 to 1/2 packs/day.  Denies any alcohol use.  Denies any drug use.     HPI  Past Medical History:  Diagnosis Date   Arthritis    Back pain    Carotid artery disease (HCC)    1-39% bilateral carotid stenosis  by dopplers 01/2023   Coronary artery disease    s.p PTCA of circumflex lesion in 2004, cardiologist Dr Micael Adas, nonobstructive CAD on CATH in June 2009 with 50% LAD   Dermatitis    Diverticulosis    Dyslipidemia    GERD (gastroesophageal reflux disease)    severe GERD, has to sleep elevated on pillows   History of kidney stones    Hypercholesteremia    Hypercholesteremia    Hypertension    Obesity    Peripheral neuropathy    Peripheral vascular disease (HCC)    Psoriasis    Urticaria     Past Surgical History:  Procedure Laterality Date   APPENDECTOMY     BACK SURGERY     CARDIAC CATHETERIZATION  10/23/2007   and 2004  DES x 1   CHOLECYSTECTOMY     CORONARY STENT PLACEMENT     HERNIA REPAIR     umbilical hernia repair   KIDNEY STONE SURGERY     NO PAST SURGERIES Bilateral    REVERSE SHOULDER ARTHROPLASTY Left 12/02/2022   Procedure: REVERSE SHOULDER ARTHROPLASTY;  Surgeon: Ellard Gunning, MD;  Location: [facility];  Service: Orthopedics;  Laterality: Left;  ; please move up to follow if room opens at 12:30pm   TONSILLECTOMY      Family History  Problem Relation Age of Onset   Alzheimer's disease Mother    Heart attack Father     Social History Social History   Tobacco Use   Smoking status: Former    Current packs/day: 0.00    Types: Cigarettes    Quit date: 05/24/1989    Years since quitting: 34.4   Smokeless tobacco: Never  Vaping Use   Vaping status: Never Used  Substance Use Topics   Alcohol use: No   Drug use: No    Allergies  Allergen Reactions   Apple  Cider Vinegar Anaphylaxis, Other (See Comments) and Shortness Of Breath    No organic    Bee Pollen Anaphylaxis, Swelling and Rash   Erythromycin Base Anaphylaxis, Hives and Swelling    To any medication that has mycin ANY MED IN THE MYCIN FAMILY   Other Anaphylaxis    ORGANIC VINEGAR - severe hypotension   Rosuvastatin  Calcium  Other (See Comments)    Severe myalgias on 5mg  twice weekly   Sulfa Antibiotics Other (See Comments)   Tetracycline Swelling    Swelling in hands and face   Ace Inhibitors    Atorvastatin      Myalgias on 10-20mg     Bee Venom Swelling   Fenofibrate  Other (See Comments)    Muscle aches  Livalo  [Pitavastatin ]     Myalgias on 1mg    Simcor [Niacin-Simvastatin Er]     Elevated CPK    Current Outpatient Medications  Medication Sig Dispense Refill   gabapentin (NEURONTIN) 300 MG capsule Take 300 mg by mouth daily.     albuterol  (VENTOLIN  HFA) 108 (90 Base) MCG/ACT inhaler Inhale 2 puffs into the lungs every 6 (six) hours as needed for wheezing or shortness of breath.     aspirin  81 MG tablet Take 81 mg by mouth daily.     clotrimazole -betamethasone  (LOTRISONE ) cream Apply 1 Application topically daily. 30 g 0   colestipol (COLESTID) 1 g tablet Take 2 g by mouth daily.     doxazosin  (CARDURA ) 4 MG tablet Take 1 tablet by mouth once daily 90 tablet 1   EPINEPHrine  (EPIPEN  IJ) Inject as directed as needed (Anaphylaxis).      Evolocumab  (REPATHA  SURECLICK) 140 MG/ML SOAJ INJECT 1 SYRINGE SUBCUTANEOUSLY EVERY 14 DAYS 6 mL 3   ezetimibe  (ZETIA ) 10 MG tablet Take 1 tablet by mouth once daily 90 tablet 2   Ferrous Sulfate (IRON) 325 (65 Fe) MG TABS Take 1 tablet by mouth in the morning and at bedtime.     hydrochlorothiazide  (HYDRODIURIL ) 25 MG tablet Take 1 tablet (25 mg total) by mouth daily. 90 tablet 1   icosapent  Ethyl (VASCEPA ) 1 g capsule Take 2 capsules by mouth twice daily 360 capsule 3   levothyroxine (SYNTHROID) 75 MCG tablet Take 75 mcg by mouth daily  before breakfast.     lidocaine  (XYLOCAINE ) 2 % solution Use as directed 15 mLs in the mouth or throat every 4 (four) hours as needed for mouth pain. 100 mL 0   lisinopril  (ZESTRIL ) 20 MG tablet Take 1 tablet (20 mg total) by mouth daily. 90 tablet 1   meclizine  (ANTIVERT ) 25 MG tablet Take 1 tablet (25 mg total) by mouth 3 (three) times daily as needed for dizziness. 30 tablet 0   metoprolol  tartrate (LOPRESSOR ) 25 MG tablet Take 0.5 tablets (12.5 mg total) by mouth 2 (two) times daily. 90 tablet 1   Multiple Vitamin (MULTIVITAMIN) capsule Take 1 capsule by mouth daily.     nitroGLYCERIN  (NITROSTAT ) 0.4 MG SL tablet DISSOLVE ONE TABLET UNDER THE TONGUE EVERY 5 MINUTES AS NEEDED FOR CHEST PAIN.  DO NOT EXCEED A TOTAL OF 3 DOSES IN 15 MINUTES 25 tablet 11   omeprazole (PRILOSEC) 40 MG capsule Take 40 mg by mouth daily.     ondansetron  (ZOFRAN ) 4 MG tablet Take 1 tablet (4 mg total) by mouth every 8 (eight) hours as needed for nausea or vomiting. 10 tablet 0   oxyCODONE -acetaminophen  (PERCOCET) 5-325 MG tablet Take 1 tablet by mouth every 4 (four) hours as needed (max 6 q). 20 tablet 0   potassium chloride  SA (KLOR-CON  M) 20 MEQ tablet Take 1 tablet (20 mEq total) by mouth daily. 90 tablet 3   sucralfate (CARAFATE) 1 g tablet Take 1 g by mouth 2 (two) times daily.     triamcinolone cream (KENALOG) 0.1 % Apply 1 Application topically 2 (two) times daily.     No current facility-administered medications for this visit.    REVIEW OF SYSTEMS:   Review of Systems  Constitutional: Positive for fatigue. Negative for appetite change, chills, fever and unexpected weight change.  HENT: Negative for mouth sores, nosebleeds, sore throat and trouble swallowing.   Eyes: Negative for eye problems and icterus.  Respiratory: Negative for cough, hemoptysis, shortness of breath and  wheezing.   Cardiovascular: Negative for chest pain and leg swelling.  Gastrointestinal: Negative for abdominal pain, constipation,  diarrhea, nausea and vomiting.  Genitourinary: Negative for bladder incontinence, difficulty urinating, dysuria, frequency and hematuria.   Musculoskeletal: Negative for back pain, gait problem, neck pain and neck stiffness.  Skin: Negative for itching and rash.  Neurological: Negative for dizziness, extremity weakness, gait problem, headaches, light-headedness and seizures.  Hematological: Negative for adenopathy. Does not bruise/bleed easily.  Psychiatric/Behavioral: Negative for confusion, depression and sleep disturbance. The patient is not nervous/anxious.     PHYSICAL EXAMINATION:  Blood pressure (!) 125/59, pulse (!) 58, temperature 98.1 F (36.7 C), temperature source Temporal, resp. rate 17, height 5' 9 (1.753 m), weight 208 lb (94.3 kg), SpO2 98%.  ECOG PERFORMANCE STATUS: 1  Physical Exam  Constitutional: Oriented to person, place, and time and well-developed, well-nourished, and in no distress.  HENT:  Head: Normocephalic and atraumatic.  Mouth/Throat: Oropharynx is clear and moist. No oropharyngeal exudate.  Eyes: Conjunctivae are normal. Right eye exhibits no discharge. Left eye exhibits no discharge. No scleral icterus.  Neck: Normal range of motion. Neck supple.  Cardiovascular: Normal rate, regular rhythm, normal heart sounds and intact distal pulses.   Pulmonary/Chest: Effort normal and breath sounds normal. No respiratory distress. No wheezes. No rales.  Abdominal: Soft. Bowel sounds are normal. Exhibits no distension and no mass. There is no tenderness.  Musculoskeletal: Normal range of motion. Exhibits no edema.  Lymphadenopathy:    No cervical adenopathy.  Neurological: Alert and oriented to person, place, and time. Exhibits normal muscle tone. Gait normal. Coordination normal.  Skin: Skin is warm and dry. Positive for mild bruising. Dry skin. No rash noted. Not diaphoretic. No erythema. No pallor.  Psychiatric: Mood, memory and judgment normal.  Vitals  reviewed.  LABORATORY DATA: Lab Results  Component Value Date   WBC 6.7 11/04/2023   HGB 10.9 (L) 11/04/2023   HCT 31.0 (L) 11/04/2023   MCV 84.0 11/04/2023   PLT 71 (L) 11/04/2023      Chemistry      Component Value Date/Time   NA 141 11/04/2023 0936   NA 141 04/02/2021 0841   K 4.6 11/04/2023 0936   CL 109 11/04/2023 0936   CO2 26 11/04/2023 0936   BUN 18 11/04/2023 0936   BUN 10 04/02/2021 0841   CREATININE 0.80 11/04/2023 0936   CREATININE 0.86 11/27/2015 0801      Component Value Date/Time   CALCIUM  9.2 11/04/2023 0936   ALKPHOS 66 11/04/2023 0936   AST 31 11/04/2023 0936   ALT 34 11/04/2023 0936   BILITOT 0.9 11/04/2023 0936       RADIOGRAPHIC STUDIES: MR BRAIN WO CONTRAST Result Date: 10/20/2023 CLINICAL DATA:  Acute neurologic deficit EXAM: MRI HEAD WITHOUT CONTRAST TECHNIQUE: Multiplanar, multiecho pulse sequences of the brain and surrounding structures were obtained without intravenous contrast. COMPARISON:  None Available. FINDINGS: Brain: No acute infarct, mass effect or extra-axial collection. No acute or chronic hemorrhage. Normal white matter signal, parenchymal volume and CSF spaces. The midline structures are normal. Vascular: Normal flow voids. Skull and upper cervical spine: Normal calvarium and skull base. Visualized upper cervical spine and soft tissues are normal. Sinuses/Orbits:No paranasal sinus fluid levels or advanced mucosal thickening. No mastoid or middle ear effusion. Normal orbits. IMPRESSION: Normal brain MRI. Electronically Signed   By: Juanetta Nordmann M.D.   On: 10/20/2023 21:22   DG Chest Portable 1 View Result Date: 10/20/2023 CLINICAL DATA:  Weakness EXAM:  PORTABLE CHEST 1 VIEW COMPARISON:  04/10/2020 FINDINGS: Hypoventilatory changes. No focal opacity, pleural effusion or pneumothorax. Stable cardiomediastinal silhouette with aortic atherosclerosis. Left shoulder replacement IMPRESSION: No active disease. Hypoventilatory changes.  Electronically Signed   By: Esmeralda Hedge M.D.   On: 10/20/2023 19:23   CT HEAD WO CONTRAST Result Date: 10/20/2023 CLINICAL DATA:  Vertigo, central. Intermittent dizziness for weeks and generalized weakness. EXAM: CT HEAD WITHOUT CONTRAST TECHNIQUE: Contiguous axial images were obtained from the base of the skull through the vertex without intravenous contrast. RADIATION DOSE REDUCTION: This exam was performed according to the departmental dose-optimization program which includes automated exposure control, adjustment of the mA and/or kV according to patient size and/or use of iterative reconstruction technique. COMPARISON:  None Available. FINDINGS: Brain: There is no evidence of an acute infarct, intracranial hemorrhage, mass, midline shift, or extra-axial fluid collection. Cerebral volume is within normal limits for age. The ventricles are normal in size. Vascular: Calcified atherosclerosis at the skull base. No hyperdense vessel. Skull: No acute fracture or suspicious lesion. Sinuses/Orbits: Visualized paranasal sinuses and mastoid air cells are clear. Unremarkable orbits. Other: None. IMPRESSION: Negative head CT. Electronically Signed   By: Aundra Lee M.D.   On: 10/20/2023 16:29    ASSESSMENT: This is a very pleasant 78 year old Caucasian male with hypertension, carotid stenosis, peripheral vascular disease, coronary artery disease, and hypercholesterolemia referred to the clinic for thrombocytopenia.  He also has mild anemia.  After review of the labs, review of the records, and discussion with the patient the patients findings are most consistent with thrombocytopenia of unclear etiology, Dr. Rosaline Coma feels this may be ITP given the abrupt drop in this platelet count.    The patient was seen with Dr. Rosaline Coma who reviewed the possible etiologies of thrombocytopenia include liver disease, splenomegaly, infectious process, nutritional deficiency, consumption/autoimmune destruction,  pseudothrombocytopenia, and bone marrow disorders. Evaluation should include full hepatitis serologies (Hep B and C) as well as HIV. Nutritional etiologies should be ruled out with Vitamin b12 and folate testing.  A peripheral blood smear can help determine if there is clumping leading to pseudothrombocytopenia. If no clear etiology can be found would need to consider immune thrombocytopenia (ITP) with consideration of bone marrow biopsy. Dr. Rosaline Coma also recommends myeloma screening with SPEP and serum free light chains due to the mild anemia.   #Thrombocytopenia --The patient was seen with Dr. Rosaline Coma today  --will order CBC, LDH, CMP, Vitamin B12, methylmalonic acid, and folate.   --will order immature platelet fraction and platelet by citrate --viral serologies with Hepatitis B and HIV. Hep C was performed recently at his PCP's office as well as ANA which was normal --Myeloma labs with Serum free light chains and SPEP --I will call them with the results next week to review the next steps and discuss the follow up instructions or if further testing is needed based on the results.  --Dr. Rosaline Coma feels the clinical presentation is most consistent with ITP which is diagnosis of exclusion. We will call the patient with the results next week and next steps based on his lab results. We may need to consider him for a bone marrow biopsy and aspirate if his labs are unremarkable with having two cell lines low.   Anemia -ESR, CRP, and reticulocyte panel added -Patient reportedly had normal capsule endoscopy last year -Patient takes iron supplements BID. Iron studies at PCP's office showed ferritin 154, iron 50, slightly low saturation at 16%, and normal TIBC   The patient voices  understanding of current disease status and treatment options and is in agreement with the current care plan.  All questions were answered. The patient knows to call the clinic with any problems, questions or concerns. We can  certainly see the patient much sooner if necessary.  Thank you so much for allowing me to participate in the care of IVOR KISHI. I will continue to follow up the patient with you and assist in his care.  Disclaimer: This note was dictated with voice recognition software. Similar sounding words can inadvertently be transcribed and may not be corrected upon review.   Rogerio Clay IV November 06, 2023, 12:57 PM  I have read the above note and personally examined the patient. I agree with the assessment and plan as noted above.  Briefly Mr. Cecilia Nishikawa is a 78 year old male who presents for evaluation of chronic mild anemia and a new thrombocytopenia.  His last labs in our system on 10/20/2023 showed a hemoglobin of 11.3, MCV 85.7, and platelets of 43.  White blood cell count is within normal limits.  At this time the etiology of his anemia is unclear but he has been taking iron supplementation due to a slightly low iron saturation at 16%.  At this time would recommend a full nutritional workup to include vitamin B12, folate, and methylmalonic acid.  Additionally will evaluate for multiple myeloma with SPEP and serum free light chains and will perform full viral serologies with hepatitis B, C, and HIV.  Will rule out inflammation with ESR and CRP.  In the event no clear etiology can be found would recommend consideration of a bone marrow biopsy.  Patient did have abdominal imaging in January 2024 which showed no evidence of splenomegaly or liver disease (other than some fatty infiltration).  Could also order an ultrasound of the abdomen to assure no worsening of his liver and no new splenomegaly.  The patient not currently having any issues with bleeding, bruising, or dark stools.  Full 10 point ROS is otherwise negative.   Rogerio Clay, MD Department of Hematology/Oncology [facility] Phone: [phone] Pager: [phone] Email:  [email or web address]

## 2023-11-04 ENCOUNTER — Other Ambulatory Visit: Payer: Self-pay | Admitting: Physician Assistant

## 2023-11-04 ENCOUNTER — Inpatient Hospital Stay

## 2023-11-04 ENCOUNTER — Encounter: Payer: Self-pay | Admitting: Physician Assistant

## 2023-11-04 ENCOUNTER — Inpatient Hospital Stay: Attending: Physician Assistant | Admitting: Physician Assistant

## 2023-11-04 VITALS — BP 125/59 | HR 58 | Temp 98.1°F | Resp 17 | Ht 69.0 in | Wt 208.0 lb

## 2023-11-04 DIAGNOSIS — D649 Anemia, unspecified: Secondary | ICD-10-CM | POA: Diagnosis not present

## 2023-11-04 DIAGNOSIS — R5383 Other fatigue: Secondary | ICD-10-CM

## 2023-11-04 DIAGNOSIS — D696 Thrombocytopenia, unspecified: Secondary | ICD-10-CM

## 2023-11-04 DIAGNOSIS — Z87891 Personal history of nicotine dependence: Secondary | ICD-10-CM | POA: Diagnosis not present

## 2023-11-04 DIAGNOSIS — Z7289 Other problems related to lifestyle: Secondary | ICD-10-CM

## 2023-11-04 DIAGNOSIS — I2510 Atherosclerotic heart disease of native coronary artery without angina pectoris: Secondary | ICD-10-CM | POA: Insufficient documentation

## 2023-11-04 DIAGNOSIS — I10 Essential (primary) hypertension: Secondary | ICD-10-CM | POA: Insufficient documentation

## 2023-11-04 DIAGNOSIS — I739 Peripheral vascular disease, unspecified: Secondary | ICD-10-CM | POA: Insufficient documentation

## 2023-11-04 DIAGNOSIS — Z79899 Other long term (current) drug therapy: Secondary | ICD-10-CM | POA: Diagnosis not present

## 2023-11-04 DIAGNOSIS — Z114 Encounter for screening for human immunodeficiency virus [HIV]: Secondary | ICD-10-CM

## 2023-11-04 LAB — WBC/PLT IN CITRATE

## 2023-11-04 LAB — CBC WITH DIFFERENTIAL (CANCER CENTER ONLY)
Abs Immature Granulocytes: 0.04 10*3/uL (ref 0.00–0.07)
Basophils Absolute: 0 10*3/uL (ref 0.0–0.1)
Basophils Relative: 0 %
Eosinophils Absolute: 0.2 10*3/uL (ref 0.0–0.5)
Eosinophils Relative: 3 %
HCT: 31 % — ABNORMAL LOW (ref 39.0–52.0)
Hemoglobin: 10.9 g/dL — ABNORMAL LOW (ref 13.0–17.0)
Immature Granulocytes: 1 %
Lymphocytes Relative: 15 %
Lymphs Abs: 1 10*3/uL (ref 0.7–4.0)
MCH: 29.5 pg (ref 26.0–34.0)
MCHC: 35.2 g/dL (ref 30.0–36.0)
MCV: 84 fL (ref 80.0–100.0)
Monocytes Absolute: 0.6 10*3/uL (ref 0.1–1.0)
Monocytes Relative: 10 %
Neutro Abs: 4.8 10*3/uL (ref 1.7–7.7)
Neutrophils Relative %: 71 %
Platelet Count: 71 10*3/uL — ABNORMAL LOW (ref 150–400)
RBC: 3.69 MIL/uL — ABNORMAL LOW (ref 4.22–5.81)
RDW: 13.5 % (ref 11.5–15.5)
WBC Count: 6.7 10*3/uL (ref 4.0–10.5)
nRBC: 0 % (ref 0.0–0.2)

## 2023-11-04 LAB — CMP (CANCER CENTER ONLY)
ALT: 34 U/L (ref 0–44)
AST: 31 U/L (ref 15–41)
Albumin: 4.2 g/dL (ref 3.5–5.0)
Alkaline Phosphatase: 66 U/L (ref 38–126)
Anion gap: 6 (ref 5–15)
BUN: 18 mg/dL (ref 8–23)
CO2: 26 mmol/L (ref 22–32)
Calcium: 9.2 mg/dL (ref 8.9–10.3)
Chloride: 109 mmol/L (ref 98–111)
Creatinine: 0.8 mg/dL (ref 0.61–1.24)
GFR, Estimated: >60 mL/min (ref >60)
Glucose, Bld: 107 mg/dL — ABNORMAL HIGH (ref 70–99)
Potassium: 4.6 mmol/L (ref 3.5–5.1)
Sodium: 141 mmol/L (ref 135–145)
Total Bilirubin: 0.9 mg/dL (ref 0.0–1.2)
Total Protein: 6.7 g/dL (ref 6.5–8.1)

## 2023-11-04 LAB — RETIC PANEL
Immature Retic Fract: 16.4 % — ABNORMAL HIGH (ref 2.3–15.9)
RBC.: 3.72 MIL/uL — ABNORMAL LOW (ref 4.22–5.81)
Retic Count, Absolute: 99.7 10*3/uL (ref 19.0–186.0)
Retic Ct Pct: 2.7 % (ref 0.4–3.1)
Reticulocyte Hemoglobin: 33.5 pg (ref >27.9)

## 2023-11-04 LAB — FOLATE: Folate: 37.1 ng/mL (ref >5.9)

## 2023-11-04 LAB — HEPATITIS B SURFACE ANTIBODY,QUALITATIVE: Hep B S Ab: NONREACTIVE

## 2023-11-04 LAB — VITAMIN B12: Vitamin B-12: 423 pg/mL (ref 180–914)

## 2023-11-04 LAB — HIV ANTIBODY (ROUTINE TESTING W REFLEX): HIV Screen 4th Generation wRfx: NONREACTIVE

## 2023-11-04 LAB — C-REACTIVE PROTEIN: CRP: 0.8 mg/dL (ref <1.0)

## 2023-11-04 LAB — LACTATE DEHYDROGENASE: LDH: 206 U/L — ABNORMAL HIGH (ref 98–192)

## 2023-11-04 LAB — IMMATURE PLATELET FRACTION: Immature Platelet Fraction: 4.7 % (ref 1.2–8.6)

## 2023-11-04 LAB — SEDIMENTATION RATE: Sed Rate: 25 mm/h — ABNORMAL HIGH (ref 0–16)

## 2023-11-04 LAB — HEPATITIS B SURFACE ANTIGEN: Hepatitis B Surface Ag: NONREACTIVE

## 2023-11-05 LAB — HEPATITIS B CORE ANTIBODY, TOTAL: HEP B CORE AB: NEGATIVE

## 2023-11-07 LAB — PROTEIN ELECTROPHORESIS, SERUM, WITH REFLEX
A/G Ratio: 1.3 (ref 0.7–1.7)
Albumin ELP: 3.6 g/dL (ref 2.9–4.4)
Alpha-1-Globulin: 0.2 g/dL (ref 0.0–0.4)
Alpha-2-Globulin: 0.9 g/dL (ref 0.4–1.0)
Beta Globulin: 0.9 g/dL (ref 0.7–1.3)
Gamma Globulin: 0.8 g/dL (ref 0.4–1.8)
Globulin, Total: 2.8 g/dL (ref 2.2–3.9)
Total Protein ELP: 6.4 g/dL (ref 6.0–8.5)

## 2023-11-07 LAB — METHYLMALONIC ACID, SERUM: Methylmalonic Acid, Quantitative: 215 nmol/L (ref 0–378)

## 2023-11-07 LAB — KAPPA/LAMBDA LIGHT CHAINS
Kappa free light chain: 23.4 mg/L — ABNORMAL HIGH (ref 3.3–19.4)
Kappa, lambda light chain ratio: 1.64 (ref 0.26–1.65)
Lambda free light chains: 14.3 mg/L (ref 5.7–26.3)

## 2023-11-09 ENCOUNTER — Telehealth: Payer: Self-pay | Admitting: Physician Assistant

## 2023-11-09 DIAGNOSIS — D7589 Other specified diseases of blood and blood-forming organs: Secondary | ICD-10-CM

## 2023-11-09 DIAGNOSIS — D649 Anemia, unspecified: Secondary | ICD-10-CM

## 2023-11-09 DIAGNOSIS — D696 Thrombocytopenia, unspecified: Secondary | ICD-10-CM

## 2023-11-09 NOTE — Telephone Encounter (Signed)
 I reviewed the patient's labs with Dr. Rosaline Coma.  Given the anemia and thrombocytopenia, Dr. Rosaline Coma would recommend a bone marrow biopsy and aspirate to rule out bone marrow pathology.  I called the patient and his wife and discussed this as well as review his results from last week.  They are in agreement to proceed with the bone marrow biopsy.  I will also reach out to the IR scheduling team to help facilitate getting this scheduled.  I will arrange for a follow-up visit in the office with Dr. Rosaline Coma to review the results a few days after his biopsy.  They are in agreement with this plan.

## 2023-11-14 ENCOUNTER — Other Ambulatory Visit: Payer: Self-pay | Admitting: Radiology

## 2023-11-14 DIAGNOSIS — D696 Thrombocytopenia, unspecified: Secondary | ICD-10-CM

## 2023-11-14 NOTE — Progress Notes (Signed)
 Patient for IR Bone Marrow Biopsy on Tues 11/15/23, I called and spoke with the patient on the phone and gave pre-procedure instructions. Pt was made aware to be here at 8:30a, NPO after MN prior to procedure as well as driver post procedure/recovery/discharge. Pt stated understanding.  Called 11/14/23

## 2023-11-15 ENCOUNTER — Encounter: Payer: Self-pay | Admitting: Radiology

## 2023-11-15 ENCOUNTER — Ambulatory Visit
Admission: RE | Admit: 2023-11-15 | Discharge: 2023-11-15 | Disposition: A | Discharge status: Home / Self Care | Source: Ambulatory Visit | Attending: Physician Assistant | Admitting: Physician Assistant

## 2023-11-15 ENCOUNTER — Telehealth: Payer: Self-pay | Admitting: Hematology and Oncology

## 2023-11-15 DIAGNOSIS — D649 Anemia, unspecified: Secondary | ICD-10-CM | POA: Insufficient documentation

## 2023-11-15 DIAGNOSIS — D696 Thrombocytopenia, unspecified: Secondary | ICD-10-CM | POA: Insufficient documentation

## 2023-11-15 DIAGNOSIS — D7589 Other specified diseases of blood and blood-forming organs: Secondary | ICD-10-CM | POA: Insufficient documentation

## 2023-11-15 DIAGNOSIS — Z7982 Long term (current) use of aspirin: Secondary | ICD-10-CM | POA: Insufficient documentation

## 2023-11-15 DIAGNOSIS — D6489 Other specified anemias: Secondary | ICD-10-CM | POA: Diagnosis not present

## 2023-11-15 DIAGNOSIS — Z1379 Encounter for other screening for genetic and chromosomal anomalies: Secondary | ICD-10-CM | POA: Diagnosis not present

## 2023-11-15 HISTORY — PX: IR BONE MARROW BIOPSY & ASPIRATION: IMG5727

## 2023-11-15 LAB — CBC WITH DIFFERENTIAL/PLATELET
Abs Immature Granulocytes: 0.03 10*3/uL (ref 0.00–0.07)
Basophils Absolute: 0 10*3/uL (ref 0.0–0.1)
Basophils Relative: 0 %
Eosinophils Absolute: 0.2 10*3/uL (ref 0.0–0.5)
Eosinophils Relative: 3 %
HCT: 32.2 % — ABNORMAL LOW (ref 39.0–52.0)
Hemoglobin: 11.3 g/dL — ABNORMAL LOW (ref 13.0–17.0)
Immature Granulocytes: 0 %
Lymphocytes Relative: 15 %
Lymphs Abs: 1.1 10*3/uL (ref 0.7–4.0)
MCH: 29.7 pg (ref 26.0–34.0)
MCHC: 35.1 g/dL (ref 30.0–36.0)
MCV: 84.7 fL (ref 80.0–100.0)
Monocytes Absolute: 0.6 10*3/uL (ref 0.1–1.0)
Monocytes Relative: 8 %
Neutro Abs: 5.1 10*3/uL (ref 1.7–7.7)
Neutrophils Relative %: 74 %
Platelets: 72 10*3/uL — ABNORMAL LOW (ref 150–400)
RBC: 3.8 MIL/uL — ABNORMAL LOW (ref 4.22–5.81)
RDW: 13.4 % (ref 11.5–15.5)
Smear Review: DECREASED
WBC: 7.1 10*3/uL (ref 4.0–10.5)
nRBC: 0 % (ref 0.0–0.2)

## 2023-11-15 MED ORDER — LIDOCAINE 1 % OPTIME INJ - NO CHARGE
10.0000 mL | Freq: Once | INTRAMUSCULAR | Status: AC
  Administered 2023-11-15: 10 mL via INTRADERMAL
  Filled 2023-11-15: qty 10

## 2023-11-15 MED ORDER — FENTANYL CITRATE (PF) 100 MCG/2ML IJ SOLN
INTRAMUSCULAR | Status: AC | PRN
  Administered 2023-11-15: 25 ug via INTRAVENOUS
  Administered 2023-11-15: 50 ug via INTRAVENOUS

## 2023-11-15 MED ORDER — FENTANYL CITRATE (PF) 100 MCG/2ML IJ SOLN
INTRAMUSCULAR | Status: AC
  Filled 2023-11-15: qty 2

## 2023-11-15 MED ORDER — HEPARIN SOD (PORK) LOCK FLUSH 100 UNIT/ML IV SOLN
INTRAVENOUS | Status: AC
  Filled 2023-11-15: qty 5

## 2023-11-15 MED ORDER — MIDAZOLAM HCL 2 MG/2ML IJ SOLN
INTRAMUSCULAR | Status: AC | PRN
  Administered 2023-11-15: 1 mg via INTRAVENOUS
  Administered 2023-11-15: .5 mg via INTRAVENOUS

## 2023-11-15 MED ORDER — MIDAZOLAM HCL 2 MG/2ML IJ SOLN
INTRAMUSCULAR | Status: AC
  Filled 2023-11-15: qty 2

## 2023-11-15 MED ORDER — SODIUM CHLORIDE 0.9 % IV SOLN: INTRAVENOUS | Status: DC

## 2023-11-15 NOTE — H&P (Signed)
 Chief Complaint:  Thrombocytopenia  Procedure: Bone Marrow Biopsy with Aspiration  Referring Provider(s): Cassandra Heilingoetter, PA-C  Supervising Physician: Jenna Hacker  Patient Status: [facility] - Out-pt  History of Present Illness: Kenneth Poole is a 78 y.o. male who was referred to Med Onc after receiving abnormal lab results during an ED visit. Patient initially presented to the ED on 5/29 with complaints of dizziness and weakness. In the ED CBC revealed platelets of 43k. Repeat labs by PCP on 6/9 revealed minimally improved platelet count of 70k and Hgb of 11.6. Additional lab work was primarily negative, but confirmed anemia and thrombocytopenia. Given rapid drop in platelet count, primary concern is for ITP. Patient referred to IR for bone marrow biopsy for further diagnostic evaluation.   Patient is resting in bed with his wife at the bedside. Admits to feeling increased fatigue over the past month, but denies any additional symptoms. Takes a baby ASA daily. NPO since midnight. All questions and concerns answered at the bedside.   Patient is Full Code  Past Medical History:  Diagnosis Date   Arthritis    Back pain    Carotid artery disease (HCC)    1-39% bilateral carotid stenosis  by dopplers 01/2023   Coronary artery disease    s.p PTCA of circumflex lesion in 2004, cardiologist Dr Shlomo, nonobstructive CAD on CATH in June 2009 with 50% LAD   Dermatitis    Diverticulosis    Dyslipidemia    GERD (gastroesophageal reflux disease)    severe GERD, has to sleep elevated on pillows   History of kidney stones    Hypercholesteremia    Hypercholesteremia    Hypertension    Obesity    Peripheral neuropathy    Peripheral vascular disease (HCC)    Psoriasis    Urticaria     Past Surgical History:  Procedure Laterality Date   APPENDECTOMY     BACK SURGERY     CARDIAC CATHETERIZATION  10/23/2007   and 2004  DES x 1   CHOLECYSTECTOMY     CORONARY STENT  PLACEMENT     HERNIA REPAIR     umbilical hernia repair   KIDNEY STONE SURGERY     NO PAST SURGERIES Bilateral    REVERSE SHOULDER ARTHROPLASTY Left 12/02/2022   Procedure: REVERSE SHOULDER ARTHROPLASTY;  Surgeon: Melita Drivers, MD;  Location: [facility];  Service: Orthopedics;  Laterality: Left;  ; please move up to follow if room opens at 12:30pm   TONSILLECTOMY      Allergies: Apple cider vinegar, Bee pollen, Erythromycin base, Other, Rosuvastatin  calcium , Sulfa antibiotics, Tetracycline, Ace inhibitors, Atorvastatin , Bee venom, Fenofibrate , Livalo  [pitavastatin ], and Simcor [niacin-simvastatin er]  Medications: Prior to Admission medications   Medication Sig Start Date End Date Taking? Authorizing Provider  albuterol  (VENTOLIN  HFA) 108 (90 Base) MCG/ACT inhaler Inhale 2 puffs into the lungs every 6 (six) hours as needed for wheezing or shortness of breath.    [provider]  aspirin  81 MG tablet Take 81 mg by mouth daily.    [provider]  clotrimazole -betamethasone  (LOTRISONE ) cream Apply 1 Application topically daily. 04/13/23   Gaynel Delon CROME, DPM  colestipol (COLESTID) 1 g tablet Take 2 g by mouth daily. 07/30/19   [provider]  doxazosin  (CARDURA ) 4 MG tablet Take 1 tablet by mouth once daily 06/30/23   Shlomo Wilbert SAUNDERS, MD  EPINEPHrine  (EPIPEN  IJ) Inject as directed as needed (Anaphylaxis).     [provider]  Evolocumab  (  REPATHA  SURECLICK) 140 MG/ML SOAJ INJECT 1 SYRINGE SUBCUTANEOUSLY EVERY 14 DAYS 04/12/23   Shlomo Wilbert SAUNDERS, MD  ezetimibe  (ZETIA ) 10 MG tablet Take 1 tablet by mouth once daily 08/09/23   Shlomo Wilbert SAUNDERS, MD  Ferrous Sulfate (IRON) 325 (65 Fe) MG TABS Take 1 tablet by mouth in the morning and at bedtime.    [provider]  gabapentin (NEURONTIN) 300 MG capsule Take 300 mg by mouth daily.    Marvene Prentice SAUNDERS, FNP  hydrochlorothiazide  (HYDRODIURIL ) 25 MG tablet Take 1 tablet (25 mg total) by mouth daily. 09/26/23    Shlomo Wilbert SAUNDERS, MD  icosapent  Ethyl (VASCEPA ) 1 g capsule Take 2 capsules by mouth twice daily 07/14/23   Turner, Traci R, MD  levothyroxine (SYNTHROID) 75 MCG tablet Take 75 mcg by mouth daily before breakfast.    [provider]  lidocaine  (XYLOCAINE ) 2 % solution Use as directed 15 mLs in the mouth or throat every 4 (four) hours as needed for mouth pain. 07/15/23   Freddi Hamilton, MD  lisinopril  (ZESTRIL ) 20 MG tablet Take 1 tablet (20 mg total) by mouth daily. 09/26/23   Shlomo Wilbert SAUNDERS, MD  meclizine  (ANTIVERT ) 25 MG tablet Take 1 tablet (25 mg total) by mouth 3 (three) times daily as needed for dizziness. 10/20/23   Curatolo, Adam, DO  metoprolol  tartrate (LOPRESSOR ) 25 MG tablet Take 0.5 tablets (12.5 mg total) by mouth 2 (two) times daily. 09/26/23   Shlomo Wilbert SAUNDERS, MD  Multiple Vitamin (MULTIVITAMIN) capsule Take 1 capsule by mouth daily.    [provider]  nitroGLYCERIN  (NITROSTAT ) 0.4 MG SL tablet DISSOLVE ONE TABLET UNDER THE TONGUE EVERY 5 MINUTES AS NEEDED FOR CHEST PAIN.  DO NOT EXCEED A TOTAL OF 3 DOSES IN 15 MINUTES 06/16/23   Turner, Wilbert SAUNDERS, MD  omeprazole (PRILOSEC) 40 MG capsule Take 40 mg by mouth daily. 08/21/19   [provider]  ondansetron  (ZOFRAN ) 4 MG tablet Take 1 tablet (4 mg total) by mouth every 8 (eight) hours as needed for nausea or vomiting. 12/02/22   Shuford, Randine, PA-C  oxyCODONE -acetaminophen  (PERCOCET) 5-325 MG tablet Take 1 tablet by mouth every 4 (four) hours as needed (max 6 q). 12/02/22   Shuford, Randine, PA-C  potassium chloride  SA (KLOR-CON  M) 20 MEQ tablet Take 1 tablet (20 mEq total) by mouth daily. 06/16/23   Shlomo Wilbert SAUNDERS, MD  sucralfate (CARAFATE) 1 g tablet Take 1 g by mouth 2 (two) times daily. 08/19/22   [provider]  triamcinolone cream (KENALOG) 0.1 % Apply 1 Application topically 2 (two) times daily.    [provider]     Family History  Problem Relation Age of Onset   Alzheimer's disease Mother     Heart attack Father     Social History   Socioeconomic History   Marital status: Married    Spouse name: Not on file   Number of children: Not on file   Years of education: Not on file   Highest education level: Not on file  Occupational History   Not on file  Tobacco Use   Smoking status: Former    Current packs/day: 0.00    Types: Cigarettes    Quit date: 05/24/1989    Years since quitting: 34.5   Smokeless tobacco: Never  Vaping Use   Vaping status: Never Used  Substance and Sexual Activity   Alcohol use: No   Drug use: No   Sexual activity: Not Currently  Other  Topics Concern   Not on file  Social History Narrative   Not on file   Social Drivers of Health   Financial Resource Strain: Low Risk  (11/04/2023)   Overall Financial Resource Strain (CARDIA)    Difficulty of Paying Living Expenses: Not hard at all  Food Insecurity: Unknown (11/04/2023)   Hunger Vital Sign    Worried About Running Out of Food in the Last Year: Not on file    Ran Out of Food in the Last Year: Never true  Transportation Needs: No Transportation Needs (11/04/2023)   PRAPARE - Administrator, Civil Service (Medical): No    Lack of Transportation (Non-Medical): No  Physical Activity: Not on file  Stress: Not on file  Social Connections: Not on file   Review of Systems  Constitutional:  Positive for fatigue.  Patient denies any headache, chest pain, shortness of breath, abdominal pain, N/V, or fever/chills. All other systems are negative.   Vital Signs: BP 135/75   Pulse 65   Temp 98.3 F (36.8 C) (Oral)   Resp 12   Ht 5' 9 (1.753 m)   SpO2 96%   BMI 30.72 kg/m   Physical Exam Vitals reviewed.  Constitutional:      Appearance: Normal appearance.  HENT:     Head: Normocephalic and atraumatic.     Mouth/Throat:     Mouth: Mucous membranes are moist.     Pharynx: Oropharynx is clear.   Cardiovascular:     Rate and Rhythm: Normal rate and regular rhythm.  Pulmonary:      Effort: Pulmonary effort is normal.     Breath sounds: Normal breath sounds.  Abdominal:     General: Abdomen is flat.     Palpations: Abdomen is soft.     Tenderness: There is no abdominal tenderness.   Musculoskeletal:        General: Normal range of motion.     Cervical back: Normal range of motion.   Skin:    General: Skin is warm and dry.   Neurological:     General: No focal deficit present.     Mental Status: He is alert and oriented to person, place, and time. Mental status is at baseline.   Psychiatric:        Mood and Affect: Mood normal.        Behavior: Behavior normal.        Judgment: Judgment normal.     Imaging: MR BRAIN WO CONTRAST Result Date: 10/20/2023 CLINICAL DATA:  Acute neurologic deficit EXAM: MRI HEAD WITHOUT CONTRAST TECHNIQUE: Multiplanar, multiecho pulse sequences of the brain and surrounding structures were obtained without intravenous contrast. COMPARISON:  None Available. FINDINGS: Brain: No acute infarct, mass effect or extra-axial collection. No acute or chronic hemorrhage. Normal white matter signal, parenchymal volume and CSF spaces. The midline structures are normal. Vascular: Normal flow voids. Skull and upper cervical spine: Normal calvarium and skull base. Visualized upper cervical spine and soft tissues are normal. Sinuses/Orbits:No paranasal sinus fluid levels or advanced mucosal thickening. No mastoid or middle ear effusion. Normal orbits. IMPRESSION: Normal brain MRI. Electronically Signed   By: Franky Stanford M.D.   On: 10/20/2023 21:22   DG Chest Portable 1 View Result Date: 10/20/2023 CLINICAL DATA:  Weakness EXAM: PORTABLE CHEST 1 VIEW COMPARISON:  04/10/2020 FINDINGS: Hypoventilatory changes. No focal opacity, pleural effusion or pneumothorax. Stable cardiomediastinal silhouette with aortic atherosclerosis. Left shoulder replacement IMPRESSION: No active disease. Hypoventilatory changes. Electronically Signed  By: Luke Bun M.D.    On: 10/20/2023 19:23   CT HEAD WO CONTRAST Result Date: 10/20/2023 CLINICAL DATA:  Vertigo, central. Intermittent dizziness for weeks and generalized weakness. EXAM: CT HEAD WITHOUT CONTRAST TECHNIQUE: Contiguous axial images were obtained from the base of the skull through the vertex without intravenous contrast. RADIATION DOSE REDUCTION: This exam was performed according to the departmental dose-optimization program which includes automated exposure control, adjustment of the mA and/or kV according to patient size and/or use of iterative reconstruction technique. COMPARISON:  None Available. FINDINGS: Brain: There is no evidence of an acute infarct, intracranial hemorrhage, mass, midline shift, or extra-axial fluid collection. Cerebral volume is within normal limits for age. The ventricles are normal in size. Vascular: Calcified atherosclerosis at the skull base. No hyperdense vessel. Skull: No acute fracture or suspicious lesion. Sinuses/Orbits: Visualized paranasal sinuses and mastoid air cells are clear. Unremarkable orbits. Other: None. IMPRESSION: Negative head CT. Electronically Signed   By: Dasie Hamburg M.D.   On: 10/20/2023 16:29    Labs:  CBC: Recent Labs    11/22/22 0853 10/20/23 1204 11/04/23 0936  WBC 7.8 7.5 6.7  HGB 11.8* 11.3* 10.9*  HCT 34.4* 32.4* 31.0*  PLT 153 43* 71*    COAGS: No results for input(s): INR, APTT in the last 8760 hours.  BMP: Recent Labs    11/22/22 0853 10/20/23 1204 11/04/23 0936  NA 138 138 141  K 4.2 4.0 4.6  CL 105 105 109  CO2 23 26 26   GLUCOSE 122* 118* 107*  BUN 16 14 18   CALCIUM  9.1 9.0 9.2  CREATININE 0.86 0.92 0.80  GFRNONAA >60 >60 >60    LIVER FUNCTION TESTS: Recent Labs    04/15/23 1034 10/20/23 1204 11/04/23 0936  BILITOT  --  1.2 0.9  AST  --  39 31  ALT 41 40 34  ALKPHOS  --  55 66  PROT  --  6.4* 6.7  ALBUMIN  --  3.7 4.2    TUMOR MARKERS: No results for input(s): AFPTM, CEA, CA199, CHROMGRNA in  the last 8760 hours.  Assessment and Plan:  Thrombocytopenia: Kenneth Poole is a 78 y.o. male with a history of mild normocytic anemia and recent thrombocytopenia who presents to [facility] Interventional Radiology department for an image-guided bone marrow biopsy with aspiration with Dr. KANDICE Banner on 11/15/23. Procedure to be performed under moderate sedation.  Risks and benefits of bone marrow biopsy was discussed with the patient and/or patient's family including, but not limited to bleeding, infection, damage to adjacent structures or low yield requiring additional tests.  All of the questions were answered and there is agreement to proceed.  Consent signed and in chart.   Thank you for allowing our service to participate in Kenneth Poole 's care.    Electronically Signed: Cashius Grandstaff M Alayja Armas, PA-C   11/15/2023, 9:05 AM     I spent a total of  30 Minutes in face to face in clinical consultation, greater than 50% of which was counseling/coordinating care for bone marrow biopsy with aspiration.

## 2023-11-15 NOTE — Discharge Instructions (Signed)
 Bone Marrow Aspiration and Bone Marrow Biopsy, Adult, Care After This sheet gives you information about how to care for yourself after your procedure. If you have problems or questions, contact your health care provider.  What can I expect after the procedure?  After the procedure, it is common to have: Mild pain and tenderness. Swelling. Bruising.  Follow these instructions at home: Take over-the-counter or prescription medicines only as told by your health care provider. You may shower tomorrow Remove band aid tomorrow, replace with another bandaid if  site has any drainage from biopsy site. Wash your hands with soap and water before you touch your biopsy site  If soap and water are not available, use hand sanitizer. Change your dressing frequently for bleeding and/or drainage. Check your puncture site every day for signs of infection. Check for: More redness, swelling, or pain. More fluid or blood. Warmth. Pus or a bad smell. Return to your normal activities in 24hours.  Do not drive for 24 hours if you were given a medicine to help you relax (sedative). Keep all follow-up visits as told by your health care provider. This is important. Contact a health care provider if: You have more redness, swelling, or pain around the puncture site. You have more fluid or blood coming from the puncture site. Your puncture site feels warm to the touch. You have pus or a bad smell coming from the puncture site. You have a fever. Your pain is not controlled with medicine. This information is not intended to replace advice given to you by your health care provider. Make sure you discuss any questions you have with your health care provider. Document Released: 11/27/2004 Document Revised: 11/28/2015 Document Reviewed: 10/22/2015 Elsevier Interactive Patient Education  2018 [employer].

## 2023-11-15 NOTE — Telephone Encounter (Signed)
 Scheduled appointments per 6/24 secure chat. Talked with the patient and he is aware of the made appointments.

## 2023-11-15 NOTE — Procedures (Signed)
 Pre procedural Dx: symptomatic thrombocytopenia  Post procedural Dx: Same  Technically successful fluoro guided biopsy of left iliac bone   EBL: minimal.   Complications: None immediate.   KANDICE Banner, MD Pager #: [phone]

## 2023-11-16 DIAGNOSIS — I700 Atherosclerosis of aorta: Secondary | ICD-10-CM | POA: Diagnosis not present

## 2023-11-16 DIAGNOSIS — I10 Essential (primary) hypertension: Secondary | ICD-10-CM | POA: Diagnosis not present

## 2023-11-17 LAB — SURGICAL PATHOLOGY

## 2023-11-21 ENCOUNTER — Inpatient Hospital Stay (HOSPITAL_BASED_OUTPATIENT_CLINIC_OR_DEPARTMENT_OTHER): Admitting: Hematology and Oncology

## 2023-11-21 ENCOUNTER — Inpatient Hospital Stay

## 2023-11-21 ENCOUNTER — Other Ambulatory Visit: Payer: Self-pay | Admitting: Hematology and Oncology

## 2023-11-21 VITALS — BP 136/62 | HR 60 | Temp 98.0°F | Resp 13 | Wt 204.5 lb

## 2023-11-21 DIAGNOSIS — D649 Anemia, unspecified: Secondary | ICD-10-CM | POA: Diagnosis not present

## 2023-11-21 DIAGNOSIS — I2510 Atherosclerotic heart disease of native coronary artery without angina pectoris: Secondary | ICD-10-CM | POA: Diagnosis not present

## 2023-11-21 DIAGNOSIS — D469 Myelodysplastic syndrome, unspecified: Secondary | ICD-10-CM | POA: Diagnosis not present

## 2023-11-21 DIAGNOSIS — Z79899 Other long term (current) drug therapy: Secondary | ICD-10-CM | POA: Diagnosis not present

## 2023-11-21 DIAGNOSIS — D7589 Other specified diseases of blood and blood-forming organs: Secondary | ICD-10-CM

## 2023-11-21 DIAGNOSIS — D696 Thrombocytopenia, unspecified: Secondary | ICD-10-CM

## 2023-11-21 DIAGNOSIS — I10 Essential (primary) hypertension: Secondary | ICD-10-CM | POA: Diagnosis not present

## 2023-11-21 DIAGNOSIS — I739 Peripheral vascular disease, unspecified: Secondary | ICD-10-CM | POA: Diagnosis not present

## 2023-11-21 LAB — CBC WITH DIFFERENTIAL (CANCER CENTER ONLY)
Abs Immature Granulocytes: 0.04 10*3/uL (ref 0.00–0.07)
Basophils Absolute: 0 10*3/uL (ref 0.0–0.1)
Basophils Relative: 0 %
Eosinophils Absolute: 0.2 10*3/uL (ref 0.0–0.5)
Eosinophils Relative: 3 %
HCT: 33.2 % — ABNORMAL LOW (ref 39.0–52.0)
Hemoglobin: 11.7 g/dL — ABNORMAL LOW (ref 13.0–17.0)
Immature Granulocytes: 1 %
Lymphocytes Relative: 15 %
Lymphs Abs: 1.1 10*3/uL (ref 0.7–4.0)
MCH: 29.6 pg (ref 26.0–34.0)
MCHC: 35.2 g/dL (ref 30.0–36.0)
MCV: 84.1 fL (ref 80.0–100.0)
Monocytes Absolute: 0.6 10*3/uL (ref 0.1–1.0)
Monocytes Relative: 9 %
Neutro Abs: 5.1 10*3/uL (ref 1.7–7.7)
Neutrophils Relative %: 72 %
Platelet Count: 67 10*3/uL — ABNORMAL LOW (ref 150–400)
RBC: 3.95 MIL/uL — ABNORMAL LOW (ref 4.22–5.81)
RDW: 13.5 % (ref 11.5–15.5)
WBC Count: 7.1 10*3/uL (ref 4.0–10.5)
nRBC: 0 % (ref 0.0–0.2)

## 2023-11-21 LAB — CMP (CANCER CENTER ONLY)
ALT: 31 U/L (ref 0–44)
AST: 28 U/L (ref 15–41)
Albumin: 4.4 g/dL (ref 3.5–5.0)
Alkaline Phosphatase: 69 U/L (ref 38–126)
Anion gap: 7 (ref 5–15)
BUN: 20 mg/dL (ref 8–23)
CO2: 26 mmol/L (ref 22–32)
Calcium: 9.5 mg/dL (ref 8.9–10.3)
Chloride: 108 mmol/L (ref 98–111)
Creatinine: 0.89 mg/dL (ref 0.61–1.24)
GFR, Estimated: >60 mL/min (ref >60)
Glucose, Bld: 105 mg/dL — ABNORMAL HIGH (ref 70–99)
Potassium: 4.5 mmol/L (ref 3.5–5.1)
Sodium: 141 mmol/L (ref 135–145)
Total Bilirubin: 0.9 mg/dL (ref 0.0–1.2)
Total Protein: 7.1 g/dL (ref 6.5–8.1)

## 2023-11-21 NOTE — Progress Notes (Unsigned)
 [facility] [phone]   [phone]  PROGRESS NOTE  Patient Care Team: Marvene Prentice SAUNDERS, FNP as PCP - General (Family Medicine) Shlomo Wilbert SAUNDERS, MD as PCP - Cardiology (Cardiology)  Hematological/Oncological History # Low Grade Myelodysplastic Syndrome # Normocytic Anemia # Thrombocytopenia   Interval History:  Kenneth Poole 78 y.o. male with medical history significant for low-grade myelodysplastic syndrome with normocytic anemia and thrombocytopenia who presents for a follow up visit. The patient's last visit was on 11/04/2023. In the interim since the last visit he underwent a bone marrow biopsy which confirmed the diagnosis of MDS.  On exam today Mr. Skeet is accompanied by his wife.  He reports that he has been well overall in the interim since our last visit.  He reports that the bone marrow biopsy went quite well with no major pain or bleeding afterwards.  He reports that he does feel like his energy levels are up-and-down.  He notes that he also did have exposure to agent orange back when he served in [place].  Other than occasional bouts of feeling exhausted and tired he has been well.  He is had no issues with fevers, chills, sweats, nausea, vomiting or diarrhea.  A full 10 point ROS is otherwise negative.  The bulk of our discussion focused on the results of his bone marrow biopsy and the steps moving forward.  Details of this conversation are noted below.  MEDICAL HISTORY:  Past Medical History:  Diagnosis Date   Arthritis    Back pain    Carotid artery disease (HCC)    1-39% bilateral carotid stenosis  by dopplers 01/2023   Coronary artery disease    s.p PTCA of circumflex lesion in 2004, cardiologist Dr Shlomo, nonobstructive CAD on CATH in June 2009 with 50% LAD   Dermatitis    Diverticulosis    Dyslipidemia    GERD (gastroesophageal reflux disease)    severe GERD, has to sleep elevated on pillows   History of kidney  stones    Hypercholesteremia    Hypercholesteremia    Hypertension    Obesity    Peripheral neuropathy    Peripheral vascular disease (HCC)    Psoriasis    Urticaria     SURGICAL HISTORY: Past Surgical History:  Procedure Laterality Date   APPENDECTOMY     BACK SURGERY     CARDIAC CATHETERIZATION  10/23/2007   and 2004  DES x 1   CHOLECYSTECTOMY     CORONARY STENT PLACEMENT     HERNIA REPAIR     umbilical hernia repair   IR BONE MARROW BIOPSY & ASPIRATION  11/15/2023   KIDNEY STONE SURGERY     NO PAST SURGERIES Bilateral    REVERSE SHOULDER ARTHROPLASTY Left 12/02/2022   Procedure: REVERSE SHOULDER ARTHROPLASTY;  Surgeon: Melita Drivers, MD;  Location: [facility];  Service: Orthopedics;  Laterality: Left;  ; please move up to follow if room opens at 12:30pm   TONSILLECTOMY      SOCIAL HISTORY: Social History   Socioeconomic History   Marital status: Married    Spouse name: Not on file   Number of children: Not on file   Years of education: Not on file   Highest education level: Not on file  Occupational History   Not on file  Tobacco Use   Smoking status: Former    Current packs/day: 0.00    Types: Cigarettes    Quit date: 05/24/1989    Years since quitting:  34.5   Smokeless tobacco: Never  Vaping Use   Vaping status: Never Used  Substance and Sexual Activity   Alcohol use: No   Drug use: No   Sexual activity: Not Currently  Other Topics Concern   Not on file  Social History Narrative   Not on file   Social Drivers of Health   Financial Resource Strain: Low Risk  (11/04/2023)   Overall Financial Resource Strain (CARDIA)    Difficulty of Paying Living Expenses: Not hard at all  Food Insecurity: Unknown (11/04/2023)   Hunger Vital Sign    Worried About Running Out of Food in the Last Year: Not on file    Ran Out of Food in the Last Year: Never true  Transportation Needs: No Transportation Needs (11/04/2023)   PRAPARE - Scientist, research (physical sciences) (Medical): No    Lack of Transportation (Non-Medical): No  Physical Activity: Not on file  Stress: Not on file  Social Connections: Not on file  Intimate Partner Violence: Not At Risk (11/04/2023)   Humiliation, Afraid, Rape, and Kick questionnaire    Fear of Current or Ex-Partner: No    Emotionally Abused: No    Physically Abused: No    Sexually Abused: No    FAMILY HISTORY: Family History  Problem Relation Age of Onset   Alzheimer's disease Mother    Heart attack Father     ALLERGIES:  is allergic to apple cider vinegar, bee pollen, erythromycin base, other, rosuvastatin  calcium , sulfa antibiotics, tetracycline, ace inhibitors, atorvastatin , bee venom, fenofibrate , livalo  [pitavastatin ], and simcor [niacin-simvastatin er].  MEDICATIONS:  Current Outpatient Medications  Medication Sig Dispense Refill   albuterol  (VENTOLIN  HFA) 108 (90 Base) MCG/ACT inhaler Inhale 2 puffs into the lungs every 6 (six) hours as needed for wheezing or shortness of breath.     aspirin  81 MG tablet Take 81 mg by mouth daily.     clotrimazole -betamethasone  (LOTRISONE ) cream Apply 1 Application topically daily. 30 g 0   colestipol (COLESTID) 1 g tablet Take 2 g by mouth daily.     doxazosin  (CARDURA ) 4 MG tablet Take 1 tablet by mouth once daily 90 tablet 1   EPINEPHrine  (EPIPEN  IJ) Inject as directed as needed (Anaphylaxis).      Evolocumab  (REPATHA  SURECLICK) 140 MG/ML SOAJ INJECT 1 SYRINGE SUBCUTANEOUSLY EVERY 14 DAYS 6 mL 3   ezetimibe  (ZETIA ) 10 MG tablet Take 1 tablet by mouth once daily 90 tablet 2   Ferrous Sulfate (IRON) 325 (65 Fe) MG TABS Take 1 tablet by mouth in the morning and at bedtime.     gabapentin (NEURONTIN) 300 MG capsule Take 300 mg by mouth daily.     hydrochlorothiazide  (HYDRODIURIL ) 25 MG tablet Take 1 tablet (25 mg total) by mouth daily. 90 tablet 1   icosapent  Ethyl (VASCEPA ) 1 g capsule Take 2 capsules by mouth twice daily 360 capsule 3   levothyroxine  (SYNTHROID) 75 MCG tablet Take 75 mcg by mouth daily before breakfast.     lidocaine  (XYLOCAINE ) 2 % solution Use as directed 15 mLs in the mouth or throat every 4 (four) hours as needed for mouth pain. 100 mL 0   lisinopril  (ZESTRIL ) 20 MG tablet Take 1 tablet (20 mg total) by mouth daily. 90 tablet 1   meclizine  (ANTIVERT ) 25 MG tablet Take 1 tablet (25 mg total) by mouth 3 (three) times daily as needed for dizziness. 30 tablet 0   metoprolol  tartrate (LOPRESSOR ) 25 MG tablet Take 0.5  tablets (12.5 mg total) by mouth 2 (two) times daily. 90 tablet 1   Multiple Vitamin (MULTIVITAMIN) capsule Take 1 capsule by mouth daily.     nitroGLYCERIN  (NITROSTAT ) 0.4 MG SL tablet DISSOLVE ONE TABLET UNDER THE TONGUE EVERY 5 MINUTES AS NEEDED FOR CHEST PAIN.  DO NOT EXCEED A TOTAL OF 3 DOSES IN 15 MINUTES 25 tablet 11   omeprazole (PRILOSEC) 40 MG capsule Take 40 mg by mouth daily.     ondansetron  (ZOFRAN ) 4 MG tablet Take 1 tablet (4 mg total) by mouth every 8 (eight) hours as needed for nausea or vomiting. 10 tablet 0   oxyCODONE -acetaminophen  (PERCOCET) 5-325 MG tablet Take 1 tablet by mouth every 4 (four) hours as needed (max 6 q). 20 tablet 0   potassium chloride  SA (KLOR-CON  M) 20 MEQ tablet Take 1 tablet (20 mEq total) by mouth daily. 90 tablet 3   sucralfate (CARAFATE) 1 g tablet Take 1 g by mouth 2 (two) times daily.     triamcinolone cream (KENALOG) 0.1 % Apply 1 Application topically 2 (two) times daily.     No current facility-administered medications for this visit.    REVIEW OF SYSTEMS:   Constitutional: ( - ) fevers, ( - )  chills , ( - ) night sweats Eyes: ( - ) blurriness of vision, ( - ) double vision, ( - ) watery eyes Ears, nose, mouth, throat, and face: ( - ) mucositis, ( - ) sore throat Respiratory: ( - ) cough, ( - ) dyspnea, ( - ) wheezes Cardiovascular: ( - ) palpitation, ( - ) chest discomfort, ( - ) lower extremity swelling Gastrointestinal:  ( - ) nausea, ( - ) heartburn, ( - )  change in bowel habits Skin: ( - ) abnormal skin rashes Lymphatics: ( - ) new lymphadenopathy, ( - ) easy bruising Neurological: ( - ) numbness, ( - ) tingling, ( - ) new weaknesses Behavioral/Psych: ( - ) mood change, ( - ) new changes  All other systems were reviewed with the patient and are negative.  PHYSICAL EXAMINATION:  Vitals:   11/21/23 0937  BP: 136/62  Pulse: 60  Resp: 13  Temp: 98 F (36.7 C)  SpO2: 98%   Filed Weights   11/21/23 0937  Weight: 204 lb 8 oz (92.8 kg)    GENERAL: Well-appearing elderly Caucasian male, alert, no distress and comfortable SKIN: skin color, texture, turgor are normal, no rashes or significant lesions EYES: conjunctiva are pink and non-injected, sclera clear LUNGS: clear to auscultation and percussion with normal breathing effort HEART: regular rate & rhythm and no murmurs and no lower extremity edema Musculoskeletal: no cyanosis of digits and no clubbing  PSYCH: alert & oriented x 3, fluent speech NEURO: no focal motor/sensory deficits  LABORATORY DATA:  I have reviewed the data as listed    Latest Ref Rng & Units 11/21/2023    8:42 AM 11/15/2023    8:32 AM 11/04/2023    9:36 AM  CBC  WBC 4.0 - 10.5 K/uL 7.1  7.1  6.7   Hemoglobin 13.0 - 17.0 g/dL 88.2  88.6  89.0   Hematocrit 39.0 - 52.0 % 33.2  32.2  31.0   Platelets 150 - 400 K/uL 67  72  71        Latest Ref Rng & Units 11/21/2023    8:42 AM 11/04/2023    9:36 AM 10/20/2023   12:04 PM  CMP  Glucose 70 - 99 mg/dL 894  107  118   BUN 8 - 23 mg/dL 20  18  14    Creatinine 0.61 - 1.24 mg/dL 9.10  9.19  9.07   Sodium 135 - 145 mmol/L 141  141  138   Potassium 3.5 - 5.1 mmol/L 4.5  4.6  4.0   Chloride 98 - 111 mmol/L 108  109  105   CO2 22 - 32 mmol/L 26  26  26    Calcium  8.9 - 10.3 mg/dL 9.5  9.2  9.0   Total Protein 6.5 - 8.1 g/dL 7.1  6.7  6.4   Total Bilirubin 0.0 - 1.2 mg/dL 0.9  0.9  1.2   Alkaline Phos 38 - 126 U/L 69  66  55   AST 15 - 41 U/L 28  31  39   ALT 0 - 44  U/L 31  34  40     No results found for: MPROTEIN Lab Results  Component Value Date   KPAFRELGTCHN 23.4 (H) 11/04/2023   LAMBDASER 14.3 11/04/2023   KAPLAMBRATIO 1.64 11/04/2023    RADIOGRAPHIC STUDIES: IR BONE MARROW BIOPSY & ASPIRATION Result Date: 11/15/2023 CLINICAL DATA:  Symptomatic thrombocytopenia EXAM: Fluoro guided bone marrow biopsy TECHNIQUE: Fluoroscopy CONTRAST:  None RADIOPHARMACEUTICALS:  None FLUOROSCOPY: 12.6 mGy COMPARISON:  None FINDINGS: The patient was placed in prone position on the fluoroscopy table. Radiopaque markers were placed on the patient's skin and initial imaging of the pelvis was performed. The patient's skin was then prepped and draped in the usual sterile fashion. Moderate sedation was provided for by the nursing staff under my supervision utilizing intravenous Versed  and fentanyl . The nurse had no other duties other than monitoring the patient and providing sedation during the procedure. I was present for the entire procedure. 1.5 mg intravenous Versed  and 75 mcg intravenous fentanyl  were administered for a total sedation time of 10 minutes for this procedure. 1% lidocaine  was used to infiltrate the skin at the access site prior to a stab incision. Local anesthesia was then used to infiltrate the region of soft tissue from the skin to the left iliac bone. The bone marrow needle was then advanced and imaging demonstrated the needle tip to be in the cortex of the left iliac bone. The bone was then penetrated and a sample was obtained. After the sample was evaluated, approximately 5 mL of heparinized bone marrow sample was obtained by aspiration. A core sample was then obtained. Multiple attempts at sampling was performed in order to get 2 1 cm segments. All needles were then removed from the patient. Sterile dressing was applied. IMPRESSION: Satisfactory core needle biopsy and aspiration of the left iliac bone marrow under fluoro guidance. Electronically Signed   By:  Cordella Banner   On: 11/15/2023 10:12    ASSESSMENT & PLAN Kenneth LELON Poole 78 y.o. male with medical history significant for low-grade myelodysplastic syndrome with normocytic anemia and thrombocytopenia who presents for a follow up visit.   # Low Grade Myelodysplastic Syndrome # Normocytic Anemia # Thrombocytopenia  -- Bone marrow biopsy confirms a diagnosis of a low-grade myelodysplastic syndrome. -- Blood work today shows a white blood cell count 7.1, hemoglobin 11.7, MCV 84.1, platelets 67 -- No clear indication for intervention at this time.  Could consider Epo shots versus hypomethylation therapy if symptoms worsen.  -- Recommend return to clinic in 3 months for labs in 6 months for labs/clinic visit.  No orders of the defined types were placed in this encounter.   All  questions were answered. The patient knows to call the clinic with any problems, questions or concerns.  A total of more than 30 minutes were spent on this encounter with face-to-face time and non-face-to-face time, including preparing to see the patient, ordering tests and/or medications, counseling the patient and coordination of care as outlined above.   Norleen IVAR Kidney, MD Department of Hematology/Oncology [facility] Phone: [phone] Pager: [phone] Email: [email or web address]  11/22/2023 4:04 PM

## 2023-11-22 ENCOUNTER — Encounter (HOSPITAL_COMMUNITY): Payer: Self-pay | Admitting: Physician Assistant

## 2023-11-23 ENCOUNTER — Encounter (HOSPITAL_COMMUNITY): Payer: Self-pay | Admitting: Physician Assistant

## 2023-12-22 DIAGNOSIS — E039 Hypothyroidism, unspecified: Secondary | ICD-10-CM | POA: Diagnosis not present

## 2023-12-22 DIAGNOSIS — E785 Hyperlipidemia, unspecified: Secondary | ICD-10-CM | POA: Diagnosis not present

## 2023-12-22 DIAGNOSIS — I10 Essential (primary) hypertension: Secondary | ICD-10-CM | POA: Diagnosis not present

## 2023-12-30 ENCOUNTER — Other Ambulatory Visit: Payer: Self-pay | Admitting: Cardiology

## 2024-01-01 ENCOUNTER — Other Ambulatory Visit: Payer: Self-pay | Admitting: Hematology and Oncology

## 2024-01-01 DIAGNOSIS — D469 Myelodysplastic syndrome, unspecified: Secondary | ICD-10-CM

## 2024-01-01 NOTE — Progress Notes (Unsigned)
 [facility] [phone]   [phone]  PROGRESS NOTE  Patient Care Team: Marvene Prentice SAUNDERS, FNP as PCP - General (Family Medicine) Shlomo Wilbert SAUNDERS, MD as PCP - Cardiology (Cardiology)  Hematological/Oncological History # Low Grade Myelodysplastic Syndrome # Normocytic Anemia # Thrombocytopenia   Interval History:  Kenneth Poole 78 y.o. male with medical history significant for low-grade myelodysplastic syndrome with normocytic anemia and thrombocytopenia who presents for a follow up visit. The patient's last visit was on 11/21/2023. In the interim since the last visit he has had no major changes in his health.  On exam today Kenneth Poole reports that he has had no major changes in his health in the interim since her last visit.  He reports he does get tired and worn out easily and has to take a nap.  He is not having any lightheadedness, dizziness, shortness of breath.  He said no trouble with headaches.  He reports that he did get a little dizzy yesterday pulling weeds.  Additionally he mowed the yard yesterday without taking any breaks and he was exhausted at the end of the day.  He reports his energy is normally a 7 or 8 out of 10 but yesterday was 3 out of 10.  Otherwise he has had no bleeding, bruising, or dark stools.  He is had no signs or symptoms concerning for infection.  Full 10 point ROS is otherwise negative.  MEDICAL HISTORY:  Past Medical History:  Diagnosis Date   Arthritis    Back pain    Carotid artery disease (HCC)    1-39% bilateral carotid stenosis  by dopplers 01/2023   Coronary artery disease    s.p PTCA of circumflex lesion in 2004, cardiologist Dr Shlomo, nonobstructive CAD on CATH in June 2009 with 50% LAD   Dermatitis    Diverticulosis    Dyslipidemia    GERD (gastroesophageal reflux disease)    severe GERD, has to sleep elevated on pillows   History of kidney stones    Hypercholesteremia    Hypercholesteremia     Hypertension    Obesity    Peripheral neuropathy    Peripheral vascular disease (HCC)    Psoriasis    Urticaria     SURGICAL HISTORY: Past Surgical History:  Procedure Laterality Date   APPENDECTOMY     BACK SURGERY     CARDIAC CATHETERIZATION  10/23/2007   and 2004  DES x 1   CHOLECYSTECTOMY     CORONARY STENT PLACEMENT     HERNIA REPAIR     umbilical hernia repair   IR BONE MARROW BIOPSY & ASPIRATION  11/15/2023   KIDNEY STONE SURGERY     NO PAST SURGERIES Bilateral    REVERSE SHOULDER ARTHROPLASTY Left 12/02/2022   Procedure: REVERSE SHOULDER ARTHROPLASTY;  Surgeon: Melita Drivers, MD;  Location: [facility];  Service: Orthopedics;  Laterality: Left;  ; please move up to follow if room opens at 12:30pm   TONSILLECTOMY      SOCIAL HISTORY: Social History   Socioeconomic History   Marital status: Married    Spouse name: Not on file   Number of children: Not on file   Years of education: Not on file   Highest education level: Not on file  Occupational History   Not on file  Tobacco Use   Smoking status: Former    Current packs/day: 0.00    Types: Cigarettes    Quit date: 05/24/1989    Years since quitting:  34.6   Smokeless tobacco: Never  Vaping Use   Vaping status: Never Used  Substance and Sexual Activity   Alcohol use: No   Drug use: No   Sexual activity: Not Currently  Other Topics Concern   Not on file  Social History Narrative   Not on file   Social Drivers of Health   Financial Resource Strain: Low Risk  (11/04/2023)   Overall Financial Resource Strain (CARDIA)    Difficulty of Paying Living Expenses: Not hard at all  Food Insecurity: Unknown (11/04/2023)   Hunger Vital Sign    Worried About Running Out of Food in the Last Year: Not on file    Ran Out of Food in the Last Year: Never true  Transportation Needs: No Transportation Needs (11/04/2023)   PRAPARE - Administrator, Civil Service (Medical): No    Lack of Transportation  (Non-Medical): No  Physical Activity: Not on file  Stress: Not on file  Social Connections: Not on file  Intimate Partner Violence: Not At Risk (11/04/2023)   Humiliation, Afraid, Rape, and Kick questionnaire    Fear of Current or Ex-Partner: No    Emotionally Abused: No    Physically Abused: No    Sexually Abused: No    FAMILY HISTORY: Family History  Problem Relation Age of Onset   Alzheimer's disease Mother    Heart attack Father     ALLERGIES:  is allergic to apple cider vinegar, bee pollen, erythromycin base, other, rosuvastatin  calcium , sulfa antibiotics, tetracycline, ace inhibitors, atorvastatin , bee venom, fenofibrate , livalo  [pitavastatin ], and simcor [niacin-simvastatin er].  MEDICATIONS:  Current Outpatient Medications  Medication Sig Dispense Refill   albuterol  (VENTOLIN  HFA) 108 (90 Base) MCG/ACT inhaler Inhale 2 puffs into the lungs every 6 (six) hours as needed for wheezing or shortness of breath.     aspirin  81 MG tablet Take 81 mg by mouth daily.     clotrimazole -betamethasone  (LOTRISONE ) cream Apply 1 Application topically daily. 30 g 0   colestipol (COLESTID) 1 g tablet Take 2 g by mouth daily.     doxazosin  (CARDURA ) 4 MG tablet Take 1 tablet by mouth once daily 90 tablet 0   EPINEPHrine  (EPIPEN  IJ) Inject as directed as needed (Anaphylaxis).      Evolocumab  (REPATHA  SURECLICK) 140 MG/ML SOAJ INJECT 1 SYRINGE SUBCUTANEOUSLY EVERY 14 DAYS 6 mL 3   ezetimibe  (ZETIA ) 10 MG tablet Take 1 tablet by mouth once daily 90 tablet 2   Ferrous Sulfate (IRON) 325 (65 Fe) MG TABS Take 1 tablet by mouth in the morning and at bedtime.     gabapentin (NEURONTIN) 300 MG capsule Take 300 mg by mouth daily.     hydrochlorothiazide  (HYDRODIURIL ) 25 MG tablet Take 1 tablet (25 mg total) by mouth daily. 90 tablet 1   icosapent  Ethyl (VASCEPA ) 1 g capsule Take 2 capsules by mouth twice daily 360 capsule 3   levothyroxine (SYNTHROID) 75 MCG tablet Take 75 mcg by mouth daily before  breakfast.     lidocaine  (XYLOCAINE ) 2 % solution Use as directed 15 mLs in the mouth or throat every 4 (four) hours as needed for mouth pain. 100 mL 0   lisinopril  (ZESTRIL ) 20 MG tablet Take 1 tablet (20 mg total) by mouth daily. 90 tablet 1   meclizine  (ANTIVERT ) 25 MG tablet Take 1 tablet (25 mg total) by mouth 3 (three) times daily as needed for dizziness. 30 tablet 0   metoprolol  tartrate (LOPRESSOR ) 25 MG tablet Take 0.5  tablets (12.5 mg total) by mouth 2 (two) times daily. 90 tablet 1   Multiple Vitamin (MULTIVITAMIN) capsule Take 1 capsule by mouth daily.     nitroGLYCERIN  (NITROSTAT ) 0.4 MG SL tablet DISSOLVE ONE TABLET UNDER THE TONGUE EVERY 5 MINUTES AS NEEDED FOR CHEST PAIN.  DO NOT EXCEED A TOTAL OF 3 DOSES IN 15 MINUTES 25 tablet 11   omeprazole (PRILOSEC) 40 MG capsule Take 40 mg by mouth daily.     ondansetron  (ZOFRAN ) 4 MG tablet Take 1 tablet (4 mg total) by mouth every 8 (eight) hours as needed for nausea or vomiting. 10 tablet 0   oxyCODONE -acetaminophen  (PERCOCET) 5-325 MG tablet Take 1 tablet by mouth every 4 (four) hours as needed (max 6 q). 20 tablet 0   potassium chloride  SA (KLOR-CON  M) 20 MEQ tablet Take 1 tablet (20 mEq total) by mouth daily. 90 tablet 3   sucralfate (CARAFATE) 1 g tablet Take 1 g by mouth 2 (two) times daily.     triamcinolone cream (KENALOG) 0.1 % Apply 1 Application topically 2 (two) times daily.     No current facility-administered medications for this visit.    REVIEW OF SYSTEMS:   Constitutional: ( - ) fevers, ( - )  chills , ( - ) night sweats Eyes: ( - ) blurriness of vision, ( - ) double vision, ( - ) watery eyes Ears, nose, mouth, throat, and face: ( - ) mucositis, ( - ) sore throat Respiratory: ( - ) cough, ( - ) dyspnea, ( - ) wheezes Cardiovascular: ( - ) palpitation, ( - ) chest discomfort, ( - ) lower extremity swelling Gastrointestinal:  ( - ) nausea, ( - ) heartburn, ( - ) change in bowel habits Skin: ( - ) abnormal skin  rashes Lymphatics: ( - ) new lymphadenopathy, ( - ) easy bruising Neurological: ( - ) numbness, ( - ) tingling, ( - ) new weaknesses Behavioral/Psych: ( - ) mood change, ( - ) new changes  All other systems were reviewed with the patient and are negative.  PHYSICAL EXAMINATION:  There were no vitals filed for this visit.  There were no vitals filed for this visit.   GENERAL: Well-appearing elderly Caucasian male, alert, no distress and comfortable SKIN: skin color, texture, turgor are normal, no rashes or significant lesions EYES: conjunctiva are pink and non-injected, sclera clear LUNGS: clear to auscultation and percussion with normal breathing effort HEART: regular rate & rhythm and no murmurs and no lower extremity edema Musculoskeletal: no cyanosis of digits and no clubbing  PSYCH: alert & oriented x 3, fluent speech NEURO: no focal motor/sensory deficits  LABORATORY DATA:  I have reviewed the data as listed    Latest Ref Rng & Units 11/21/2023    8:42 AM 11/15/2023    8:32 AM 11/04/2023    9:36 AM  CBC  WBC 4.0 - 10.5 K/uL 7.1  7.1  6.7   Hemoglobin 13.0 - 17.0 g/dL 88.2  88.6  89.0   Hematocrit 39.0 - 52.0 % 33.2  32.2  31.0   Platelets 150 - 400 K/uL 67  72  71        Latest Ref Rng & Units 11/21/2023    8:42 AM 11/04/2023    9:36 AM 10/20/2023   12:04 PM  CMP  Glucose 70 - 99 mg/dL 894  892  881   BUN 8 - 23 mg/dL 20  18  14    Creatinine 0.61 - 1.24 mg/dL  0.89  0.80  0.92   Sodium 135 - 145 mmol/L 141  141  138   Potassium 3.5 - 5.1 mmol/L 4.5  4.6  4.0   Chloride 98 - 111 mmol/L 108  109  105   CO2 22 - 32 mmol/L 26  26  26    Calcium  8.9 - 10.3 mg/dL 9.5  9.2  9.0   Total Protein 6.5 - 8.1 g/dL 7.1  6.7  6.4   Total Bilirubin 0.0 - 1.2 mg/dL 0.9  0.9  1.2   Alkaline Phos 38 - 126 U/L 69  66  55   AST 15 - 41 U/L 28  31  39   ALT 0 - 44 U/L 31  34  40     No results found for: MPROTEIN Lab Results  Component Value Date   KPAFRELGTCHN 23.4 (H)  11/04/2023   LAMBDASER 14.3 11/04/2023   KAPLAMBRATIO 1.64 11/04/2023    RADIOGRAPHIC STUDIES: No results found.   ASSESSMENT & PLAN Kenneth Poole 78 y.o. male with medical history significant for low-grade myelodysplastic syndrome with normocytic anemia and thrombocytopenia who presents for a follow up visit.   # Low Grade Myelodysplastic Syndrome # Normocytic Anemia # Thrombocytopenia  -- Bone marrow biopsy confirms a diagnosis of a low-grade myelodysplastic syndrome. -- Blood work today shows a white blood cell count 6.5, Hgb 10.9, MCV 83.4, Plt 128  -- No clear indication for intervention at this time.  Could consider Epo shots versus hypomethylation therapy if symptoms worsen.  -- Recommend return to clinic in 3 months for labs in 6 months for labs/clinic visit.  No orders of the defined types were placed in this encounter.   All questions were answered. The patient knows to call the clinic with any problems, questions or concerns.  A total of more than 25 minutes were spent on this encounter with face-to-face time and non-face-to-face time, including preparing to see the patient, ordering tests and/or medications, counseling the patient and coordination of care as outlined above.   Norleen IVAR Kidney, MD Department of Hematology/Oncology [facility] Phone: [phone] Pager: [phone] Email: [email or web address]  01/01/2024 8:49 PM

## 2024-01-02 ENCOUNTER — Inpatient Hospital Stay: Attending: Physician Assistant

## 2024-01-02 ENCOUNTER — Inpatient Hospital Stay (HOSPITAL_BASED_OUTPATIENT_CLINIC_OR_DEPARTMENT_OTHER): Admitting: Hematology and Oncology

## 2024-01-02 VITALS — BP 123/62 | HR 59 | Temp 98.1°F | Resp 17 | Ht 69.0 in | Wt 206.1 lb

## 2024-01-02 DIAGNOSIS — Z79899 Other long term (current) drug therapy: Secondary | ICD-10-CM | POA: Insufficient documentation

## 2024-01-02 DIAGNOSIS — D649 Anemia, unspecified: Secondary | ICD-10-CM

## 2024-01-02 DIAGNOSIS — D7589 Other specified diseases of blood and blood-forming organs: Secondary | ICD-10-CM | POA: Diagnosis not present

## 2024-01-02 DIAGNOSIS — D696 Thrombocytopenia, unspecified: Secondary | ICD-10-CM

## 2024-01-02 DIAGNOSIS — D469 Myelodysplastic syndrome, unspecified: Secondary | ICD-10-CM

## 2024-01-02 LAB — CBC WITH DIFFERENTIAL (CANCER CENTER ONLY)
Abs Immature Granulocytes: 0.03 K/uL (ref 0.00–0.07)
Basophils Absolute: 0 K/uL (ref 0.0–0.1)
Basophils Relative: 1 %
Eosinophils Absolute: 0.3 K/uL (ref 0.0–0.5)
Eosinophils Relative: 4 %
HCT: 30.7 % — ABNORMAL LOW (ref 39.0–52.0)
Hemoglobin: 10.9 g/dL — ABNORMAL LOW (ref 13.0–17.0)
Immature Granulocytes: 1 %
Lymphocytes Relative: 15 %
Lymphs Abs: 1 K/uL (ref 0.7–4.0)
MCH: 29.6 pg (ref 26.0–34.0)
MCHC: 35.5 g/dL (ref 30.0–36.0)
MCV: 83.4 fL (ref 80.0–100.0)
Monocytes Absolute: 0.5 K/uL (ref 0.1–1.0)
Monocytes Relative: 8 %
Neutro Abs: 4.8 K/uL (ref 1.7–7.7)
Neutrophils Relative %: 71 %
Platelet Count: 128 K/uL — ABNORMAL LOW (ref 150–400)
RBC: 3.68 MIL/uL — ABNORMAL LOW (ref 4.22–5.81)
RDW: 13.6 % (ref 11.5–15.5)
WBC Count: 6.5 K/uL (ref 4.0–10.5)
nRBC: 0 % (ref 0.0–0.2)

## 2024-01-02 LAB — CMP (CANCER CENTER ONLY)
ALT: 25 U/L (ref 0–44)
AST: 24 U/L (ref 15–41)
Albumin: 4.1 g/dL (ref 3.5–5.0)
Alkaline Phosphatase: 67 U/L (ref 38–126)
Anion gap: 6 (ref 5–15)
BUN: 21 mg/dL (ref 8–23)
CO2: 26 mmol/L (ref 22–32)
Calcium: 8.9 mg/dL (ref 8.9–10.3)
Chloride: 108 mmol/L (ref 98–111)
Creatinine: 0.9 mg/dL (ref 0.61–1.24)
GFR, Estimated: >60 mL/min (ref >60)
Glucose, Bld: 117 mg/dL — ABNORMAL HIGH (ref 70–99)
Potassium: 4.1 mmol/L (ref 3.5–5.1)
Sodium: 140 mmol/L (ref 135–145)
Total Bilirubin: 0.6 mg/dL (ref 0.0–1.2)
Total Protein: 6.5 g/dL (ref 6.5–8.1)

## 2024-01-03 ENCOUNTER — Encounter: Payer: Self-pay | Admitting: *Deleted

## 2024-01-11 ENCOUNTER — Encounter: Payer: Self-pay | Admitting: Podiatry

## 2024-01-11 ENCOUNTER — Ambulatory Visit (INDEPENDENT_AMBULATORY_CARE_PROVIDER_SITE_OTHER): Admitting: Podiatry

## 2024-01-11 DIAGNOSIS — B351 Tinea unguium: Secondary | ICD-10-CM

## 2024-01-11 DIAGNOSIS — M79674 Pain in right toe(s): Secondary | ICD-10-CM

## 2024-01-11 DIAGNOSIS — I739 Peripheral vascular disease, unspecified: Secondary | ICD-10-CM

## 2024-01-11 DIAGNOSIS — M79675 Pain in left toe(s): Secondary | ICD-10-CM

## 2024-01-11 NOTE — Progress Notes (Signed)
This patient returns to my office for at risk foot care.  This patient requires this care by a professional since this patient will be at risk due to having PVD.  This patient is unable to cut nails himself since the patient cannot reach his nails.These nails are painful walking and wearing shoes.  This patient presents for at risk foot care today.  General Appearance  Alert, conversant and in no acute stress.  Vascular  Dorsalis pedis and posterior tibial  pulses are weakly  palpable  bilaterally.  Capillary return is within normal limits  bilaterally. Temperature is within normal limits  bilaterally.  Neurologic  Senn-Weinstein monofilament wire test within normal limits  bilaterally. Muscle power within normal limits bilaterally.  Nails Thick disfigured discolored nails with subungual debris  from hallux to fifth toes bilaterally. No evidence of bacterial infection or drainage bilaterally.  Orthopedic  No limitations of motion  feet .  No crepitus or effusions noted.  No bony pathology or digital deformities noted.  Skin  normotropic skin with no porokeratosis noted bilaterally.  No signs of infections or ulcers noted.     Onychomycosis  Pain in right toes  Pain in left toes  Consent was obtained for treatment procedures.   Mechanical debridement of nails 1-5  bilaterally performed with a nail nipper.  Filed with dremel without incident.    Return office visit     3 months                Told patient to return for periodic foot care and evaluation due to potential at risk complications.   Helane Gunther DPM

## 2024-02-10 ENCOUNTER — Ambulatory Visit (HOSPITAL_COMMUNITY)
Admission: RE | Admit: 2024-02-10 | Discharge: 2024-02-10 | Disposition: A | Discharge status: Home / Self Care | Source: Ambulatory Visit | Attending: Cardiology | Admitting: Cardiology

## 2024-02-10 DIAGNOSIS — I6523 Occlusion and stenosis of bilateral carotid arteries: Secondary | ICD-10-CM | POA: Diagnosis not present

## 2024-02-12 ENCOUNTER — Ambulatory Visit: Payer: Self-pay | Admitting: Cardiology

## 2024-02-12 ENCOUNTER — Encounter: Payer: Self-pay | Admitting: Cardiology

## 2024-03-07 DIAGNOSIS — D46Z Other myelodysplastic syndromes: Secondary | ICD-10-CM | POA: Diagnosis not present

## 2024-03-07 DIAGNOSIS — I10 Essential (primary) hypertension: Secondary | ICD-10-CM | POA: Diagnosis not present

## 2024-03-07 DIAGNOSIS — D509 Iron deficiency anemia, unspecified: Secondary | ICD-10-CM | POA: Diagnosis not present

## 2024-03-07 DIAGNOSIS — G629 Polyneuropathy, unspecified: Secondary | ICD-10-CM | POA: Diagnosis not present

## 2024-03-07 DIAGNOSIS — K219 Gastro-esophageal reflux disease without esophagitis: Secondary | ICD-10-CM | POA: Diagnosis not present

## 2024-03-07 DIAGNOSIS — E039 Hypothyroidism, unspecified: Secondary | ICD-10-CM | POA: Diagnosis not present

## 2024-03-07 DIAGNOSIS — K760 Fatty (change of) liver, not elsewhere classified: Secondary | ICD-10-CM | POA: Diagnosis not present

## 2024-03-07 DIAGNOSIS — I25119 Atherosclerotic heart disease of native coronary artery with unspecified angina pectoris: Secondary | ICD-10-CM | POA: Diagnosis not present

## 2024-03-07 DIAGNOSIS — E785 Hyperlipidemia, unspecified: Secondary | ICD-10-CM | POA: Diagnosis not present

## 2024-03-08 ENCOUNTER — Encounter: Payer: Self-pay | Admitting: Hematology and Oncology

## 2024-03-10 ENCOUNTER — Other Ambulatory Visit: Payer: Self-pay | Admitting: Cardiology

## 2024-03-12 DIAGNOSIS — Z23 Encounter for immunization: Secondary | ICD-10-CM | POA: Diagnosis not present

## 2024-03-23 ENCOUNTER — Telehealth: Payer: Self-pay | Admitting: Cardiology

## 2024-03-23 MED ORDER — DOXAZOSIN MESYLATE 4 MG PO TABS: 4.0000 mg | ORAL_TABLET | Freq: Every day | ORAL | 0 refills | Status: DC

## 2024-03-23 NOTE — Telephone Encounter (Signed)
 Pt's medication was sent to pt's pharmacy as requested. Confirmation received.

## 2024-03-23 NOTE — Telephone Encounter (Signed)
*  STAT* If patient is at the pharmacy, call can be transferred to refill team.   1. Which medications need to be refilled? (please list name of each medication and dose if known) doxazosin  (CARDURA ) 4 MG tablet   2. Which pharmacy/location (including street and city if local pharmacy) is medication to be sent to?  [facility] [address] [place], [place] - [address]    3. Do they need a 30 day or 90 day supply? 90

## 2024-03-29 ENCOUNTER — Telehealth: Payer: Self-pay | Admitting: Cardiology

## 2024-03-29 NOTE — Telephone Encounter (Signed)
*  STAT* If patient is at the pharmacy, call can be transferred to refill team.   1. Which medications need to be refilled? (please list name of each medication and dose if known) doxazosin  (CARDURA ) 4 MG tablet    2. Would you like to learn more about the convenience, safety, & potential cost savings by using the [facility]? No      3. Are you open to using the [facility] (Type [facility]. No    4. Which pharmacy/location (including street and city if local pharmacy) is medication to be sent to?[facility] [address] [place], [place] - [address]    5. Do they need a 30 day or 90 day supply? 90 day   Pt has appt scheduled 11/25.  Pt is out of medication

## 2024-03-29 NOTE — Telephone Encounter (Signed)
 RX sent in on 03/23/24. Called Pharmacy and verified. Called Pt and Pt verbalized understanding.

## 2024-04-01 ENCOUNTER — Other Ambulatory Visit: Payer: Self-pay | Admitting: Hematology and Oncology

## 2024-04-01 DIAGNOSIS — D469 Myelodysplastic syndrome, unspecified: Secondary | ICD-10-CM

## 2024-04-01 DIAGNOSIS — D7589 Other specified diseases of blood and blood-forming organs: Secondary | ICD-10-CM

## 2024-04-02 ENCOUNTER — Inpatient Hospital Stay: Attending: Physician Assistant

## 2024-04-02 DIAGNOSIS — D649 Anemia, unspecified: Secondary | ICD-10-CM | POA: Insufficient documentation

## 2024-04-02 DIAGNOSIS — D696 Thrombocytopenia, unspecified: Secondary | ICD-10-CM | POA: Diagnosis not present

## 2024-04-02 DIAGNOSIS — D469 Myelodysplastic syndrome, unspecified: Secondary | ICD-10-CM

## 2024-04-02 DIAGNOSIS — D7589 Other specified diseases of blood and blood-forming organs: Secondary | ICD-10-CM

## 2024-04-02 LAB — CBC WITH DIFFERENTIAL (CANCER CENTER ONLY)
Abs Immature Granulocytes: 0.02 K/uL (ref 0.00–0.07)
Basophils Absolute: 0 K/uL (ref 0.0–0.1)
Basophils Relative: 1 %
Eosinophils Absolute: 0.2 K/uL (ref 0.0–0.5)
Eosinophils Relative: 3 %
HCT: 31.4 % — ABNORMAL LOW (ref 39.0–52.0)
Hemoglobin: 11.1 g/dL — ABNORMAL LOW (ref 13.0–17.0)
Immature Granulocytes: 0 %
Lymphocytes Relative: 14 %
Lymphs Abs: 0.9 K/uL (ref 0.7–4.0)
MCH: 29.1 pg (ref 26.0–34.0)
MCHC: 35.4 g/dL (ref 30.0–36.0)
MCV: 82.2 fL (ref 80.0–100.0)
Monocytes Absolute: 0.5 K/uL (ref 0.1–1.0)
Monocytes Relative: 8 %
Neutro Abs: 4.7 K/uL (ref 1.7–7.7)
Neutrophils Relative %: 74 %
Platelet Count: 137 K/uL — ABNORMAL LOW (ref 150–400)
RBC: 3.82 MIL/uL — ABNORMAL LOW (ref 4.22–5.81)
RDW: 13.9 % (ref 11.5–15.5)
WBC Count: 6.2 K/uL (ref 4.0–10.5)
nRBC: 0 % (ref 0.0–0.2)

## 2024-04-02 LAB — CMP (CANCER CENTER ONLY)
ALT: 36 U/L (ref 0–44)
AST: 33 U/L (ref 15–41)
Albumin: 4.1 g/dL (ref 3.5–5.0)
Alkaline Phosphatase: 70 U/L (ref 38–126)
Anion gap: 6 (ref 5–15)
BUN: 18 mg/dL (ref 8–23)
CO2: 27 mmol/L (ref 22–32)
Calcium: 9.3 mg/dL (ref 8.9–10.3)
Chloride: 107 mmol/L (ref 98–111)
Creatinine: 0.97 mg/dL (ref 0.61–1.24)
GFR, Estimated: >60 mL/min (ref >60)
Glucose, Bld: 98 mg/dL (ref 70–99)
Potassium: 4.3 mmol/L (ref 3.5–5.1)
Sodium: 140 mmol/L (ref 135–145)
Total Bilirubin: 0.8 mg/dL (ref 0.0–1.2)
Total Protein: 6.7 g/dL (ref 6.5–8.1)

## 2024-04-02 LAB — LACTATE DEHYDROGENASE: LDH: 205 U/L — ABNORMAL HIGH (ref 98–192)

## 2024-04-05 ENCOUNTER — Other Ambulatory Visit: Payer: Self-pay | Admitting: Cardiology

## 2024-04-12 ENCOUNTER — Ambulatory Visit: Admitting: Podiatry

## 2024-04-17 ENCOUNTER — Encounter: Payer: Self-pay | Admitting: Cardiology

## 2024-04-17 ENCOUNTER — Ambulatory Visit: Attending: Cardiology | Admitting: Cardiology

## 2024-04-17 VITALS — BP 120/52 | HR 60 | Ht 69.0 in | Wt 209.0 lb

## 2024-04-17 DIAGNOSIS — I2510 Atherosclerotic heart disease of native coronary artery without angina pectoris: Secondary | ICD-10-CM | POA: Diagnosis present

## 2024-04-17 DIAGNOSIS — I10 Essential (primary) hypertension: Secondary | ICD-10-CM | POA: Diagnosis present

## 2024-04-17 DIAGNOSIS — E785 Hyperlipidemia, unspecified: Secondary | ICD-10-CM | POA: Diagnosis present

## 2024-04-17 DIAGNOSIS — I6523 Occlusion and stenosis of bilateral carotid arteries: Secondary | ICD-10-CM | POA: Diagnosis present

## 2024-04-17 DIAGNOSIS — Z79899 Other long term (current) drug therapy: Secondary | ICD-10-CM | POA: Diagnosis present

## 2024-04-17 NOTE — Addendum Note (Signed)
 Addended by: JANIT GENI CROME on: 04/17/2024 09:26 AM   Modules accepted: Orders

## 2024-04-17 NOTE — Progress Notes (Signed)
 Cardiology Office Note:    Date:  04/17/2024   ID:  Kenneth Poole, DOB Aug 23, 1945, MRN [id number]  PCP:  Marvene Prentice SAUNDERS, FNP  Cardiologist:  Wilbert Bihari, MD    Referring MD: Marvene Prentice SAUNDERS, FNP   Chief Complaint  Patient presents with   Coronary Artery Disease   Hypertension   Hyperlipidemia    History of Present Illness:    Kenneth Poole is a 78 y.o. male with a hx of ASCAD s/p PTCA of circumflex lesion in 2004 and cath in June 2009 with 50% LAD.  He also has HTN and dyslipidemia.  Nuclear stress test for CP 01/2017 showed no ischemia.  Stress PET CT 2024 low risk.   He is here well.  He has recently had some SOB and has been dx with MDS after workup for anemia. Hbg 11.1 on 04/02/2024. He denies any chest pain or pressure but has had some indigestion symptoms intermittently but is exertional and nonexertional.  He takes Gaviscon and the sx resolve. He denies any PND, orthopnea, syncope, dizziness, palpitations. He has occasional LE edema.   Past Medical History:  Diagnosis Date   Arthritis    Back pain    Carotid artery disease    1-39% bilateral carotid stenosis  by dopplers 01/2024   Coronary artery disease    s.p PTCA of circumflex lesion in 2004, cardiologist Dr Bihari, nonobstructive CAD on CATH in June 2009 with 50% LAD   Dermatitis    Diverticulosis    Dyslipidemia    GERD (gastroesophageal reflux disease)    severe GERD, has to sleep elevated on pillows   History of kidney stones    Hypercholesteremia    Hypercholesteremia    Hypertension    Obesity    Peripheral neuropathy    Peripheral vascular disease    Psoriasis    Urticaria     Past Surgical History:  Procedure Laterality Date   APPENDECTOMY     BACK SURGERY     CARDIAC CATHETERIZATION  10/23/2007   and 2004  DES x 1   CHOLECYSTECTOMY     CORONARY STENT PLACEMENT     HERNIA REPAIR     umbilical hernia repair   IR BONE MARROW BIOPSY & ASPIRATION  11/15/2023   KIDNEY STONE SURGERY      NO PAST SURGERIES Bilateral    REVERSE SHOULDER ARTHROPLASTY Left 12/02/2022   Procedure: REVERSE SHOULDER ARTHROPLASTY;  Surgeon: Melita Drivers, MD;  Location: [facility];  Service: Orthopedics;  Laterality: Left;  ; please move up to follow if room opens at 12:30pm   TONSILLECTOMY      Current Medications: Current Meds  Medication Sig   albuterol  (VENTOLIN  HFA) 108 (90 Base) MCG/ACT inhaler Inhale 2 puffs into the lungs every 6 (six) hours as needed for wheezing or shortness of breath.   aspirin  81 MG tablet Take 81 mg by mouth daily.   clotrimazole -betamethasone  (LOTRISONE ) cream Apply 1 Application topically daily.   colestipol (COLESTID) 1 g tablet Take 2 g by mouth daily.   doxazosin  (CARDURA ) 4 MG tablet Take 1 tablet (4 mg total) by mouth daily.   EPINEPHrine  (EPIPEN  IJ) Inject as directed as needed (Anaphylaxis).    Evolocumab  (REPATHA  SURECLICK) 140 MG/ML SOAJ INJECT 1 SYRINGE  SUBCUTANEOUSLY EVERY 14 DAYS   ezetimibe  (ZETIA ) 10 MG tablet Take 1 tablet by mouth once daily   gabapentin (NEURONTIN) 300 MG capsule Take 300 mg by mouth daily.   hydrochlorothiazide  (HYDRODIURIL ) 25 MG  tablet Take 1 tablet (25 mg total) by mouth daily.   icosapent  Ethyl (VASCEPA ) 1 g capsule Take 2 capsules by mouth twice daily   levothyroxine (SYNTHROID) 75 MCG tablet Take 75 mcg by mouth daily before breakfast.   lisinopril  (ZESTRIL ) 20 MG tablet Take 1 tablet (20 mg total) by mouth daily.   metoprolol  tartrate (LOPRESSOR ) 25 MG tablet Take 0.5 tablets (12.5 mg total) by mouth 2 (two) times daily.   Multiple Vitamin (MULTIVITAMIN) capsule Take 1 capsule by mouth daily.   nitroGLYCERIN  (NITROSTAT ) 0.4 MG SL tablet DISSOLVE ONE TABLET UNDER THE TONGUE EVERY 5 MINUTES AS NEEDED FOR CHEST PAIN.  DO NOT EXCEED A TOTAL OF 3 DOSES IN 15 MINUTES   omeprazole (PRILOSEC) 40 MG capsule Take 40 mg by mouth daily.   potassium chloride  SA (KLOR-CON  M) 20 MEQ tablet Take 1 tablet (20 mEq total) by mouth daily.    sucralfate (CARAFATE) 1 g tablet Take 1 g by mouth 2 (two) times daily.   triamcinolone cream (KENALOG) 0.1 % Apply 1 Application topically 2 (two) times daily.     Allergies:   Apple cider vinegar, Bee pollen, Erythromycin base, Other, Rosuvastatin  calcium , Sulfa antibiotics, Tetracycline, Ace inhibitors, Atorvastatin , Bee venom, Fenofibrate , Livalo  [pitavastatin ], and Simcor [niacin-simvastatin er]   Social History   Socioeconomic History   Marital status: Married    Spouse name: Not on file   Number of children: Not on file   Years of education: Not on file   Highest education level: Not on file  Occupational History   Not on file  Tobacco Use   Smoking status: Former    Current packs/day: 0.00    Types: Cigarettes    Quit date: 05/24/1989    Years since quitting: 34.9   Smokeless tobacco: Never  Vaping Use   Vaping status: Never Used  Substance and Sexual Activity   Alcohol use: No   Drug use: No   Sexual activity: Not Currently  Other Topics Concern   Not on file  Social History Narrative   Not on file   Social Drivers of Health   Financial Resource Strain: Low Risk  (11/04/2023)   Overall Financial Resource Strain (CARDIA)    Difficulty of Paying Living Expenses: Not hard at all  Food Insecurity: Unknown (11/04/2023)   Hunger Vital Sign    Worried About Running Out of Food in the Last Year: Not on file    Ran Out of Food in the Last Year: Never true  Transportation Needs: No Transportation Needs (11/04/2023)   PRAPARE - Administrator, Civil Service (Medical): No    Lack of Transportation (Non-Medical): No  Physical Activity: Not on file  Stress: Not on file  Social Connections: Not on file     Family History: The patient's family history includes Alzheimer's disease in his mother; Heart attack in his father.  ROS:   Please see the history of present illness.    ROS  All other systems reviewed and negative.   EKGs/Labs/Other Studies Reviewed:     The following studies were reviewed today:   Recent Labs: 04/02/2024: ALT 36; BUN 18; Creatinine 0.97; Hemoglobin 11.1; Platelet Count 137; Potassium 4.3; Sodium 140   Recent Lipid Panel    Component Value Date/Time   CHOL 103 04/15/2023 1034   TRIG 212 (H) 04/15/2023 1034   HDL 40 04/15/2023 1034   CHOLHDL 2.6 04/15/2023 1034   CHOLHDL 5.9 (H) 11/27/2015 0801   VLDL 38 (H)  11/27/2015 0801   LDLCALC 30 04/15/2023 1034   LDLDIRECT 146 (H) 04/09/2015 0802    Physical Exam:    VS:  BP (!) 120/52   Pulse 60   Ht 5' 9 (1.753 m)   Wt 209 lb (94.8 kg)   SpO2 96%   BMI 30.86 kg/m     Wt Readings from Last 3 Encounters:  04/17/24 209 lb (94.8 kg)  01/02/24 206 lb 1.6 oz (93.5 kg)  11/21/23 204 lb 8 oz (92.8 kg)    GEN: Well nourished, well developed in no acute distress HEENT: Normal NECK: No JVD; No carotid bruits LYMPHATICS: No lymphadenopathy CARDIAC:RRR, no murmurs, rubs, gallops RESPIRATORY:  Clear to auscultation without rales, wheezing or rhonchi  ABDOMEN: Soft, non-tender, non-distended MUSCULOSKELETAL:  No edema; No deformity  SKIN: Warm and dry NEUROLOGIC:  Alert and oriented x 3 PSYCHIATRIC:  Normal affect  ASSESSMENT:    1. Coronary artery disease involving native coronary artery of native heart without angina pectoris   2. Primary hypertension   3. Bilateral carotid artery stenosis   4. Hyperlipidemia LDL goal <70       PLAN:    In order of problems listed above:    1.  ASCAD -s/p PTCA of circumflex lesion in 2004 and cath in June 2009 with 50% LAD.   -Nuclear stress test 01/2017 showed no ischemia. -Stress PET CT low risk with normal LV function myocardial blood flow reserve could not be reported there were severe coronary artery calcifications in the LAD left circumflex and RCA. -He has not had any anginal symptoms since I saw him last>>he has some indigestion sx but they are relieved with Gaviscon and has a hx of GERD. -Continue aspirin  81  mg daily, Repatha , Zetia  10 mg daily, Lopressor  12.5 mg twice daily as needed refills  2.  HTN  - BP controlled at 120/52 mmHg today -Continue Cardura  4 mg nightly, HCTZ 25 mg daily, Zestril  20 mg daily, Lopressor  12.5 mg twice daily with as needed refills -I have personally reviewed and interpreted outside labs performed by patient's PCP which showed serum creatinine 0.97 and potassium 4.3 on 04/02/2024  3.  Bilateral carotid artery stenosis  -Carotid Dopplers 01/2023 showed 1-39% bilateral carotid stenosis  -Carotid Dopplers 02/11/2024: 1 to 39% bilateral carotid artery stenosis -Repeat Dopplers in 2 years -Continue aspirin  and Repatha    4.  Hyperlipidemia  -LDL goal < 70.  -Statin intolerant -I have personally reviewed and interpreted outside labs performed by patient's PCP which showed LDL 20, HDL 39 and triglycerides 228 on 09/14/2023 -Continue colestipol 2 g daily, Repatha , Zetia  10 mg daily, Vascepa  2 g twice daily with as needed refills -his insurance is no longer going to cover Vascepa  so I will refer him back to lipid clinic to make recommendations regarding his elevated TAGS -Repeat FLP and ALT when your fasting  Followup with me in 1 year  Medication Adjustments/Labs and Tests Ordered: Current medicines are reviewed at length with the patient today.  Concerns regarding medicines are outlined above.  No orders of the defined types were placed in this encounter.   No orders of the defined types were placed in this encounter.   Signed, Wilbert Bihari, MD  04/17/2024 9:11 AM    [facility]

## 2024-04-17 NOTE — Patient Instructions (Signed)
 Medication Instructions:  Your physician recommends that you continue on your current medications as directed. Please refer to the Current Medication list given to you today.  *If you need a refill on your cardiac medications before your next appointment, please call your pharmacy*  Lab Work: Please complete a FASTING lipid panel and an ALT at any [facility] If you have labs (blood work) drawn today and your tests are completely normal, you will receive your results only by: MyChart Message (if you have MyChart) OR A paper copy in the mail If you have any lab test that is abnormal or we need to change your treatment, we will call you to review the results.  Testing/Procedures: None.  Follow-Up: At [facility], you and your health needs are our priority.  As part of our continuing mission to provide you with exceptional heart care, our providers are all part of one team.  This team includes your primary Cardiologist (physician) and Advanced Practice Providers or APPs (Physician Assistants and Nurse Practitioners) who all work together to provide you with the care you need, when you need it.  Your next appointment:   1 year(s)  Provider:   Wilbert Bihari, MD    We recommend signing up for the patient portal called MyChart.  Sign up information is provided on this After Visit Summary.  MyChart is used to connect with patients for Virtual Visits (Telemedicine).  Patients are able to view lab/test results, encounter notes, upcoming appointments, etc.  Non-urgent messages can be sent to your provider as well.   To learn more about what you can do with MyChart, go to [email or web address].   Other Instructions Dr. Bihari reviewed your cholesterol labs. Your levels are still elevated. She recommends you be referred to our lipid clinic. This is run by our pharmacists. They look into what you've been on before, what side effects you've had, even costs of medications and insurance coverage.  The goal is to get you on medication or a combo of medications that control your cholesterol with a minimum of side effects. I will place that referral and someone will call you to set up an appointment. Let me know if you have any questions.

## 2024-04-18 ENCOUNTER — Ambulatory Visit (INDEPENDENT_AMBULATORY_CARE_PROVIDER_SITE_OTHER): Admitting: Podiatry

## 2024-04-18 ENCOUNTER — Encounter: Payer: Self-pay | Admitting: Podiatry

## 2024-04-18 DIAGNOSIS — M79674 Pain in right toe(s): Secondary | ICD-10-CM | POA: Diagnosis not present

## 2024-04-18 DIAGNOSIS — B351 Tinea unguium: Secondary | ICD-10-CM

## 2024-04-18 DIAGNOSIS — M79675 Pain in left toe(s): Secondary | ICD-10-CM

## 2024-04-18 DIAGNOSIS — I2510 Atherosclerotic heart disease of native coronary artery without angina pectoris: Secondary | ICD-10-CM | POA: Diagnosis not present

## 2024-04-18 DIAGNOSIS — I739 Peripheral vascular disease, unspecified: Secondary | ICD-10-CM

## 2024-04-18 DIAGNOSIS — Z79899 Other long term (current) drug therapy: Secondary | ICD-10-CM | POA: Diagnosis not present

## 2024-04-18 LAB — LIPID PANEL
Chol/HDL Ratio: 2.3 ratio (ref 0.0–5.0)
Cholesterol, Total: 87 mg/dL — ABNORMAL LOW (ref 100–199)
HDL: 38 mg/dL — ABNORMAL LOW (ref >39)
LDL Chol Calc (NIH): 19 mg/dL (ref 0–99)
Triglycerides: 185 mg/dL — ABNORMAL HIGH (ref 0–149)
VLDL Cholesterol Cal: 30 mg/dL (ref 5–40)

## 2024-04-18 LAB — ALT: ALT: 36 IU/L (ref 0–44)

## 2024-04-18 NOTE — Progress Notes (Signed)
This patient returns to my office for at risk foot care.  This patient requires this care by a professional since this patient will be at risk due to having PVD.  This patient is unable to cut nails himself since the patient cannot reach his nails.These nails are painful walking and wearing shoes.  This patient presents for at risk foot care today.  General Appearance  Alert, conversant and in no acute stress.  Vascular  Dorsalis pedis and posterior tibial  pulses are weakly  palpable  bilaterally.  Capillary return is within normal limits  bilaterally. Temperature is within normal limits  bilaterally.  Neurologic  Senn-Weinstein monofilament wire test within normal limits  bilaterally. Muscle power within normal limits bilaterally.  Nails Thick disfigured discolored nails with subungual debris  from hallux to fifth toes bilaterally. No evidence of bacterial infection or drainage bilaterally.  Orthopedic  No limitations of motion  feet .  No crepitus or effusions noted.  No bony pathology or digital deformities noted.  Skin  normotropic skin with no porokeratosis noted bilaterally.  No signs of infections or ulcers noted.     Onychomycosis  Pain in right toes  Pain in left toes  Consent was obtained for treatment procedures.   Mechanical debridement of nails 1-5  bilaterally performed with a nail nipper.  Filed with dremel without incident.    Return office visit     3 months                Told patient to return for periodic foot care and evaluation due to potential at risk complications.   Helane Gunther DPM

## 2024-04-20 ENCOUNTER — Ambulatory Visit: Payer: Self-pay | Admitting: Cardiology

## 2024-04-24 ENCOUNTER — Other Ambulatory Visit: Payer: Self-pay

## 2024-04-24 DIAGNOSIS — E7800 Pure hypercholesterolemia, unspecified: Secondary | ICD-10-CM

## 2024-04-25 DIAGNOSIS — X32XXXD Exposure to sunlight, subsequent encounter: Secondary | ICD-10-CM | POA: Diagnosis not present

## 2024-04-25 DIAGNOSIS — L400 Psoriasis vulgaris: Secondary | ICD-10-CM | POA: Diagnosis not present

## 2024-04-25 DIAGNOSIS — L570 Actinic keratosis: Secondary | ICD-10-CM | POA: Diagnosis not present

## 2024-04-27 ENCOUNTER — Other Ambulatory Visit: Payer: Self-pay | Admitting: Cardiology

## 2024-05-03 ENCOUNTER — Other Ambulatory Visit: Payer: Self-pay | Admitting: Cardiology

## 2024-05-10 ENCOUNTER — Ambulatory Visit

## 2024-05-21 ENCOUNTER — Other Ambulatory Visit (HOSPITAL_COMMUNITY): Payer: Self-pay

## 2024-05-21 ENCOUNTER — Telehealth: Payer: Self-pay | Admitting: Pharmacy Technician

## 2024-05-21 NOTE — Telephone Encounter (Signed)
 Pharmacy Patient Advocate Encounter   Received notification from Onbase that prior authorization for repatha  is required/requested.   Insurance verification completed.   The patient is insured through [facility].   Per test claim: PA required; PA submitted to above mentioned insurance via Latent Key/confirmation #/EOC [id number] Status is pending

## 2024-05-21 NOTE — Telephone Encounter (Signed)
 Pharmacy Patient Advocate Encounter  Received notification from [facility] that Prior Authorization for repatha  has been APPROVED from 05/21/24 to 05/21/25   PA #/Case ID/Reference #: [id number]

## 2024-06-07 ENCOUNTER — Telehealth: Payer: Self-pay | Admitting: Pharmacy Technician

## 2024-06-07 ENCOUNTER — Other Ambulatory Visit (HOSPITAL_COMMUNITY): Payer: Self-pay

## 2024-06-07 ENCOUNTER — Ambulatory Visit: Attending: Cardiology

## 2024-06-07 DIAGNOSIS — E7800 Pure hypercholesterolemia, unspecified: Secondary | ICD-10-CM | POA: Insufficient documentation

## 2024-06-07 MED ORDER — VASCEPA 1 G PO CAPS: 2.0000 g | ORAL_CAPSULE | Freq: Two times a day (BID) | ORAL | 3 refills | Status: AC

## 2024-06-07 NOTE — Progress Notes (Signed)
 Patient ID: MILLIE SHORB                 DOB: 09-29-1945                    MRN: [id number]      HPI: Kenneth Poole is a 79 y.o. male patient referred to lipid clinic by Dr. Shlomo. PMH is significant for CAD s/p PTCA of circumflex lesion in 2004 and cath in 2009 with 50% LAD, HLD, and HTN.   Patient was last seen by Dr. Shlomo in November 2025 for CAD follow-up. The most recent lipid panel showed elevated triglycerides at 185 mg/dL. Additionally, it appears the insurance no longer covers generic Vascepa  but covers brand Vascepa .  As a result, the patient was referred to the PharmD lipid clinic.  Today, the patient presents accompanied by his wife, who provided a letter from the insurance company stating they will not cover generic Vascepa , but will cover the brand version instead. I explained that the letter indicates coverage for brand Vascepa  and will update the prescription accordingly so branded Vascepa  is dispensed. It appears that the cost of his cholesterol medications has been covered by the [employer], which they are very appreciative of. The patients wife noted that the grant will expire at the beginning of February and needs to be renewed.   The patient is tolerating his current HLD regimen well and reports no missed doses. His most recent lipid panel shows LDL and HDL at goal, with triglycerides remaining elevated at 185 mg/dL. He acknowledges some variability in his diet and admits there is room for improvement.  Current Medications: Repatha  140 mg every 14 days, ezetimibe  10 mg daily, Vascepa  2 g twice daily, colestipol 2 g daily  Intolerances: atorvastatin  (myalgias), simvastatin (elevated CPK), pitavastatin  (myalgias), fenofibrate  (myalgias) Risk Factors: age, CAD, HTN LDL goal: <70; Trig goal: < 150 Lipid panel (03/2024): Chol 87, Trig 185, HDL 38, LDL 19 Liver enzymes (03/2024): ALT 36  Diet:  Breakfast: Options include waffles, an omelet on toast, cereal  with 2% milk, Pop-Tarts, bacon, or sausage. Lunch/Dinner: Common options are spaghetti, grilled chicken, meatloaf, and broccoli. Beverages: Drinks approximately three bottles of water  daily.  Exercise:  Neuropathy limits some physical activity; however, the patient is able to mow the yard using a push mower and is planning to join a gym soon  Family History:  Relation Problem Comments  Mother (Deceased) Alzheimer's disease     Father (Deceased) Heart attack     Brother Metallurgist)   Brother (Alive)   Maternal Grandmother (Deceased)   Maternal Grandfather (Deceased)   Paternal Grandmother (Deceased)   Paternal Grandfather (Deceased)    Social History:  Alcohol: none  Smoking: none   Labs:  Lipid Panel     Component Value Date/Time   CHOL 87 (L) 04/18/2024 0758   TRIG 185 (H) 04/18/2024 0758   HDL 38 (L) 04/18/2024 0758   CHOLHDL 2.3 04/18/2024 0758   CHOLHDL 5.9 (H) 11/27/2015 0801   VLDL 38 (H) 11/27/2015 0801   LDLCALC 19 04/18/2024 0758   LDLDIRECT 146 (H) 04/09/2015 0802   LABVLDL 30 04/18/2024 0758    Past Medical History:  Diagnosis Date   Arthritis    Back pain    Carotid artery disease    1-39% bilateral carotid stenosis  by dopplers 01/2024   Coronary artery disease    s.p PTCA of circumflex lesion in 2004, cardiologist Dr Shlomo, nonobstructive CAD on CATH  in June 2009 with 50% LAD   Dermatitis    Diverticulosis    Dyslipidemia    GERD (gastroesophageal reflux disease)    severe GERD, has to sleep elevated on pillows   History of kidney stones    Hypercholesteremia    Hypercholesteremia    Hypertension    Obesity    Peripheral neuropathy    Peripheral vascular disease    Psoriasis    Urticaria     Medications Ordered Prior to Encounter[1]  Allergies[2]  Assessment/Plan:  1. Hyperlipidemia -  Problem  Hypercholesteremia   Hypercholesteremia Assessment:  Trig goal: < 150; last Trig 185 mg/dL (88/7974) LDL goal: < 70 mg/dl; last LDLc 19  mg/dl (88/7974) Tolerates current regimen well without any side effects  Intolerances: atorvastatin  (myalgias), simvastatin (elevated CPK), pitavastatin  (myalgias), fenofibrate  (myalgias) Encouraged patient to follow heart healthy diet and limit carbs as much as possible Encouraged patient to engage in 150 minutes of moderate intensity aerobic exercise each week  Plan: Continue taking Repatha  140 mg every 14 days, ezetimibe  10 mg daily, Vascepa  2 g twice daily, colestipol 2 g daily  Rx for brand Vascepa  sent to pharmacy Plan to recheck lipid panel in approximately 12 weeks after the patient has implemented dietary changes to assess for improvement in triglyceride levels Informed patient that [employer] grant has been renewed: Effective: 07/05/24 - 07/04/25  Patient to follow up with Dr. Shlomo ~ 03/2025    Thank you,  Faith Patricelli E. Fredrika Canby, Pharm.D, CPP [place] [facility]. [facility] [address], [place], [place] [address] Phone: [phone]; Fax: [phone]        [1]  Current Outpatient Medications on File Prior to Visit  Medication Sig Dispense Refill   albuterol  (VENTOLIN  HFA) 108 (90 Base) MCG/ACT inhaler Inhale 2 puffs into the lungs every 6 (six) hours as needed for wheezing or shortness of breath.     aspirin  81 MG tablet Take 81 mg by mouth daily.     clotrimazole -betamethasone  (LOTRISONE ) cream Apply 1 Application topically daily. 30 g 0   colestipol (COLESTID) 1 g tablet Take 2 g by mouth daily.     doxazosin  (CARDURA ) 4 MG tablet Take 1 tablet (4 mg total) by mouth daily. 90 tablet 0   EPINEPHrine  (EPIPEN  IJ) Inject as directed as needed (Anaphylaxis).      Evolocumab  (REPATHA  SURECLICK) 140 MG/ML SOAJ INJECT 1 SYRINGE  SUBCUTANEOUSLY EVERY 14 DAYS 6 mL 3   ezetimibe  (ZETIA ) 10 MG tablet Take 1 tablet by mouth once daily 90 tablet 3   Ferrous Sulfate (IRON) 325 (65 Fe) MG TABS Take 1 tablet by mouth in the morning and at bedtime.  (Patient not taking: Reported on 01/02/2024)     gabapentin (NEURONTIN) 300 MG capsule Take 300 mg by mouth daily.     hydrochlorothiazide  (HYDRODIURIL ) 25 MG tablet Take 1 tablet (25 mg total) by mouth daily. 90 tablet 1   levothyroxine (SYNTHROID) 75 MCG tablet Take 75 mcg by mouth daily before breakfast.     lidocaine  (XYLOCAINE ) 2 % solution Use as directed 15 mLs in the mouth or throat every 4 (four) hours as needed for mouth pain. (Patient not taking: Reported on 01/02/2024) 100 mL 0   lisinopril  (ZESTRIL ) 20 MG tablet Take 1 tablet (20 mg total) by mouth daily. 90 tablet 1   meclizine  (ANTIVERT ) 25 MG tablet Take 1 tablet (25 mg total) by mouth 3 (three) times daily as needed for  dizziness. (Patient not taking: Reported on 01/02/2024) 30 tablet 0   metoprolol  tartrate (LOPRESSOR ) 25 MG tablet Take 1/2 (one-half) tablet by mouth twice daily 90 tablet 3   Multiple Vitamin (MULTIVITAMIN) capsule Take 1 capsule by mouth daily.     nitroGLYCERIN  (NITROSTAT ) 0.4 MG SL tablet DISSOLVE ONE TABLET UNDER THE TONGUE EVERY 5 MINUTES AS NEEDED FOR CHEST PAIN.  DO NOT EXCEED A TOTAL OF 3 DOSES IN 15 MINUTES 25 tablet 11   omeprazole (PRILOSEC) 40 MG capsule Take 40 mg by mouth daily.     ondansetron  (ZOFRAN ) 4 MG tablet Take 1 tablet (4 mg total) by mouth every 8 (eight) hours as needed for nausea or vomiting. (Patient not taking: Reported on 01/02/2024) 10 tablet 0   oxyCODONE -acetaminophen  (PERCOCET) 5-325 MG tablet Take 1 tablet by mouth every 4 (four) hours as needed (max 6 q). (Patient not taking: Reported on 01/02/2024) 20 tablet 0   potassium chloride  SA (KLOR-CON  M) 20 MEQ tablet Take 1 tablet (20 mEq total) by mouth daily. 90 tablet 3   sucralfate (CARAFATE) 1 g tablet Take 1 g by mouth 2 (two) times daily.     triamcinolone cream (KENALOG) 0.1 % Apply 1 Application topically 2 (two) times daily.     No current facility-administered medications on file prior to visit.  [2]  Allergies Allergen  Reactions   Apple Cider Vinegar Anaphylaxis, Other (See Comments) and Shortness Of Breath    No organic    Bee Pollen Anaphylaxis, Swelling and Rash   Erythromycin Base Anaphylaxis, Hives and Swelling    To any medication that has mycin ANY MED IN THE MYCIN FAMILY   Other Anaphylaxis    ORGANIC VINEGAR - severe hypotension   Rosuvastatin  Calcium  Other (See Comments)    Severe myalgias on 5mg  twice weekly   Sulfa Antibiotics Other (See Comments)   Tetracycline Swelling    Swelling in hands and face   Ace Inhibitors    Atorvastatin      Myalgias on 10-20mg     Bee Venom Swelling   Fenofibrate  Other (See Comments)    Muscle aches    Livalo  [Pitavastatin ]     Myalgias on 1mg    Simcor [Niacin-Simvastatin Er]     Elevated CPK

## 2024-06-07 NOTE — Assessment & Plan Note (Signed)
 Assessment:  Trig goal: < 150; last Trig 185 mg/dL (88/7974) LDL goal: < 70 mg/dl; last LDLc 19 mg/dl (88/7974) Tolerates current regimen well without any side effects  Intolerances: atorvastatin  (myalgias), simvastatin (elevated CPK), pitavastatin  (myalgias), fenofibrate  (myalgias) Encouraged patient to follow heart healthy diet and limit carbs as much as possible Encouraged patient to engage in 150 minutes of moderate intensity aerobic exercise each week  Plan: Continue taking Repatha  140 mg every 14 days, ezetimibe  10 mg daily, Vascepa  2 g twice daily, colestipol 2 g daily  Rx for brand Vascepa  sent to pharmacy Plan to recheck lipid panel in approximately 12 weeks after the patient has implemented dietary changes to assess for improvement in triglyceride levels Informed patient that [employer] grant has been renewed: Effective: 07/05/24 - 07/04/25  Patient to follow up with Dr. Shlomo ~ 03/2025

## 2024-06-07 NOTE — Telephone Encounter (Signed)
" ° ° °  Insurance prefers brand -doesn't look like will need a pa -should go on 06/09/24 for brand "

## 2024-06-07 NOTE — Telephone Encounter (Signed)
 Patient Advocate Encounter   The patient was approved for a [employer] grant that will help cover the cost of repatha , ezetimibe  and colestipol, vascepa   Total amount awarded, 2500.  Effective: 07/05/24 - 07/04/25   APW:[id number] ERW:[id number] Hmnle:[id number] PI:[id number] [employer] ID: [id number]   Patient informed via mychart

## 2024-06-07 NOTE — Patient Instructions (Signed)
 Your Results:             Your most recent labs Goal  Total Cholesterol 87 < 200  Triglycerides 185 < 150  HDL (happy/good cholesterol) 38 > 40  LDL (lousy/bad cholesterol 19 < 70   Medication changes: Continue Repatha  140 mg every 14 days, ezetimibe  10 mg daily, Vascepa  2 g twice daily, colestipol 2 g daily  Lab orders:We want to repeat labs 3 months.  We will send you a lab order to remind you once we        get closer to that time.    Messiah Rovira E. Stacie Knutzen, Pharm.D, CPP [place] [facility]. [facility] [address], [place], [place] [address] Phone: [phone]; Fax: [phone]     It is also recommended that patients with high cholesterol adhere to a heart healthy diet, get regular exercise, avoid use of tobacco products, and maintain a healthy weight. Steps that you can take to help in these areas:  Limit consumption of trans fats, saturated fats, and cholesterol in your diet  Increase intake of lean meats such as chicken, turkey, and fish  Increase intake of foods rich in fiber such as fresh fruits, vegetables, beans and oatmeal Exercise as you are able; even 30 minutes of walking daily can aid in increasing heart health

## 2024-06-08 ENCOUNTER — Other Ambulatory Visit: Payer: Self-pay | Admitting: Cardiology

## 2024-06-21 ENCOUNTER — Other Ambulatory Visit: Payer: Self-pay | Admitting: Cardiology

## 2024-06-29 ENCOUNTER — Other Ambulatory Visit: Payer: Self-pay | Admitting: Cardiology

## 2024-07-02 ENCOUNTER — Inpatient Hospital Stay: Attending: Physician Assistant

## 2024-07-02 ENCOUNTER — Inpatient Hospital Stay: Admitting: Hematology and Oncology

## 2024-07-19 ENCOUNTER — Ambulatory Visit: Admitting: Podiatry
